# Patient Record
Sex: Female | Born: 1955
Health system: Southern US, Community
[De-identification: ages and names within clinical notes are randomized; demographics above are authoritative.]

## PROBLEM LIST (undated history)

## (undated) ENCOUNTER — Emergency Department (HOSPITAL_BASED_OUTPATIENT_CLINIC_OR_DEPARTMENT_OTHER)

## (undated) DIAGNOSIS — G2581 Restless legs syndrome: Secondary | ICD-10-CM

## (undated) DIAGNOSIS — N390 Urinary tract infection, site not specified: Secondary | ICD-10-CM

## (undated) DIAGNOSIS — F419 Anxiety disorder, unspecified: Secondary | ICD-10-CM

## (undated) DIAGNOSIS — Z8619 Personal history of other infectious and parasitic diseases: Secondary | ICD-10-CM

## (undated) DIAGNOSIS — G4700 Insomnia, unspecified: Secondary | ICD-10-CM

## (undated) HISTORY — DX: Anxiety disorder, unspecified: F41.9

## (undated) HISTORY — DX: Personal history of other infectious and parasitic diseases: Z86.19

## (undated) HISTORY — DX: Restless legs syndrome: G25.81

## (undated) HISTORY — PX: WISDOM TOOTH EXTRACTION: SHX21

## (undated) HISTORY — PX: OTHER SURGICAL HISTORY: SHX169

## (undated) HISTORY — DX: Insomnia, unspecified: G47.00

---

## 1998-02-18 ENCOUNTER — Other Ambulatory Visit: Admission: RE | Admit: 1998-02-18 | Discharge: 1998-02-18 | Payer: Self-pay | Admitting: Obstetrics and Gynecology

## 1999-04-23 ENCOUNTER — Other Ambulatory Visit: Admission: RE | Admit: 1999-04-23 | Discharge: 1999-04-23 | Payer: Self-pay | Admitting: Obstetrics and Gynecology

## 2000-06-20 ENCOUNTER — Other Ambulatory Visit: Admission: RE | Admit: 2000-06-20 | Discharge: 2000-06-20 | Payer: Self-pay | Admitting: Obstetrics and Gynecology

## 2001-11-13 ENCOUNTER — Other Ambulatory Visit: Admission: RE | Admit: 2001-11-13 | Discharge: 2001-11-13 | Payer: Self-pay | Admitting: Obstetrics and Gynecology

## 2002-12-09 ENCOUNTER — Other Ambulatory Visit: Admission: RE | Admit: 2002-12-09 | Discharge: 2002-12-09 | Payer: Self-pay | Admitting: Obstetrics and Gynecology

## 2004-01-12 ENCOUNTER — Other Ambulatory Visit: Admission: RE | Admit: 2004-01-12 | Discharge: 2004-01-12 | Payer: Self-pay | Admitting: Obstetrics and Gynecology

## 2005-02-09 ENCOUNTER — Other Ambulatory Visit: Admission: RE | Admit: 2005-02-09 | Discharge: 2005-02-09 | Payer: Self-pay | Admitting: Obstetrics and Gynecology

## 2011-01-05 ENCOUNTER — Other Ambulatory Visit: Payer: Self-pay | Admitting: Family Medicine

## 2011-01-05 ENCOUNTER — Ambulatory Visit
Admission: RE | Admit: 2011-01-05 | Discharge: 2011-01-05 | Disposition: A | Discharge status: Home / Self Care | Payer: BC Managed Care – PPO | Source: Ambulatory Visit | Attending: Family Medicine | Admitting: Family Medicine

## 2011-01-05 DIAGNOSIS — M79671 Pain in right foot: Secondary | ICD-10-CM

## 2011-11-19 ENCOUNTER — Encounter (HOSPITAL_COMMUNITY): Payer: Self-pay | Admitting: *Deleted

## 2011-11-19 ENCOUNTER — Emergency Department (HOSPITAL_COMMUNITY): Payer: BC Managed Care – PPO

## 2011-11-19 ENCOUNTER — Emergency Department (HOSPITAL_COMMUNITY)
Admission: EM | Admit: 2011-11-19 | Discharge: 2011-11-19 | Disposition: A | Discharge status: Home / Self Care | Payer: BC Managed Care – PPO | Attending: Emergency Medicine | Admitting: Emergency Medicine

## 2011-11-19 DIAGNOSIS — S62660B Nondisplaced fracture of distal phalanx of right index finger, initial encounter for open fracture: Secondary | ICD-10-CM

## 2011-11-19 DIAGNOSIS — S62639B Displaced fracture of distal phalanx of unspecified finger, initial encounter for open fracture: Secondary | ICD-10-CM | POA: Insufficient documentation

## 2011-11-19 DIAGNOSIS — M79609 Pain in unspecified limb: Secondary | ICD-10-CM | POA: Insufficient documentation

## 2011-11-19 DIAGNOSIS — W230XXA Caught, crushed, jammed, or pinched between moving objects, initial encounter: Secondary | ICD-10-CM | POA: Insufficient documentation

## 2011-11-19 DIAGNOSIS — S61209A Unspecified open wound of unspecified finger without damage to nail, initial encounter: Secondary | ICD-10-CM | POA: Insufficient documentation

## 2011-11-19 MED ORDER — HYDROCODONE-ACETAMINOPHEN 5-325 MG PO TABS
2.0000 | ORAL_TABLET | Freq: Once | ORAL | Status: AC
  Administered 2011-11-19: 2 via ORAL
  Filled 2011-11-19: qty 2

## 2011-11-19 MED ORDER — CEPHALEXIN 250 MG PO CAPS
500.0000 mg | ORAL_CAPSULE | Freq: Once | ORAL | Status: AC
  Administered 2011-11-19: 500 mg via ORAL
  Filled 2011-11-19: qty 2

## 2011-11-19 MED ORDER — HYDROCODONE-ACETAMINOPHEN 5-325 MG PO TABS: 2.0000 | ORAL_TABLET | Freq: Four times a day (QID) | ORAL | Status: AC | PRN

## 2011-11-19 MED ORDER — LIDOCAINE HCL (PF) 1 % IJ SOLN
5.0000 mL | Freq: Once | INTRAMUSCULAR | Status: AC
  Administered 2011-11-19: 5 mL
  Filled 2011-11-19: qty 5

## 2011-11-19 MED ORDER — CEPHALEXIN 500 MG PO CAPS: 500.0000 mg | ORAL_CAPSULE | Freq: Four times a day (QID) | ORAL | Status: AC

## 2011-11-19 NOTE — ED Provider Notes (Signed)
History     CSN: [id number]  Arrival date & time 11/19/11  1513   First MD Initiated Contact with Patient 11/19/11 1619      Chief Complaint  Patient presents with  . Finger Injury    (Consider location/radiation/quality/duration/timing/severity/associated sxs/prior treatment) HPI Patient is a 56 year old female who is referred here today after and during her right index finger. Patient got her finger caught in her dog's chain collar. She sustained a laceration to the ulnar side of the right first digit along the distal phalanx. This abuts the nailbed but does not disrupt it. Patient did have a plain film that shows that she has a distal tuft fracture. She says that her last tetanus shot has been within the past 10 years. Patient seeming to be stable and has no other health problems. Her pain as a 6/10. It is worse with palpation movement. It is a throbbing sensation.There are no other associated or modifying factors.  History reviewed. No pertinent past medical history.  History reviewed. No pertinent past surgical history.  History reviewed. No pertinent family history.  History  Substance Use Topics  . Smoking status: Not on file  . Smokeless tobacco: Not on file  . Alcohol Use: No    OB History    Grav Para Term Preterm Abortions TAB SAB Ect Mult Living                  Review of Systems  Constitutional: Negative.   HENT: Negative.   Eyes: Negative.   Respiratory: Negative.   Cardiovascular: Negative.   Gastrointestinal: Negative.   Genitourinary: Negative.   Musculoskeletal:       See HPI  Skin: Positive for wound.  Neurological: Negative.   Hematological: Negative.   Psychiatric/Behavioral: Negative.   All other systems reviewed and are negative.    Allergies  Review of patient's allergies indicates no known allergies.  Home Medications   Current Outpatient Rx  Name Route Sig Dispense Refill  . ZOLPIDEM TARTRATE 10 MG PO TABS Oral Take 10 mg by mouth  at bedtime as needed. For sleep    . CEPHALEXIN 500 MG PO CAPS Oral Take 1 capsule (500 mg total) by mouth 4 (four) times daily. 40 capsule 0  . HYDROCODONE-ACETAMINOPHEN 5-325 MG PO TABS Oral Take 2 tablets by mouth every 6 (six) hours as needed for pain. 30 tablet 0    BP 116/63  Pulse 66  Temp(Src) 98.4 F (36.9 C) (Oral)  Resp 20  SpO2 100%  Physical Exam  Nursing note and vitals reviewed. Constitutional: She is oriented to person, place, and time. She appears well-developed and well-nourished. No distress.  HENT:  Head: Normocephalic and atraumatic.  Eyes: Conjunctivae and EOM are normal. Pupils are equal, round, and reactive to light.  Neck: Normal range of motion.  Musculoskeletal:       Right index finger with linear laceration along the ulnar surface of the fingernail it extends toward the palmar surface. It is approximately 3 cm in length. There is some bruising noted beneath the nail bed. The nail itself is intact there  Neurological: She is alert and oriented to person, place, and time.  Skin: Skin is warm and dry. No rash noted.       See HPI  Psychiatric: She has a normal mood and affect.    ED Course  Procedures (including critical care time)  Labs Reviewed - No data to display Dg Finger Index Right  11/19/2011  *RADIOLOGY REPORT*  Clinical Data: Injured right index finger  RIGHT INDEX FINGER 2+V  Comparison: None.  Findings: Distal tuft fracture.  No additional fractures are seen.  The visualized soft tissues are unremarkable.  Mild soft tissue swelling/irregularity.  IMPRESSION: Distal tuft fracture.  Original Report Authenticated By: Charline Bills, M.D.     1. Open nondisplaced fracture of distal phalanx of right index finger       MDM   Patient was evaluated by myself. She did have fracture with associated laceration. In prophylaxis for open fracture she was given Keflex 500 mg by mouth. Laceration repair was performed by my physician's assistant.  Please see her note for details of procedure. Patient did not require tetanus shot. Following completion of procedure patient was offered splint but declined to wait for this. She was given pain medication. Patient was discharged with prescription for Keflex as well as Vicodin. She was given the name of the hand specialist on call. She is to see any physician in 5-7 days for suture removal and specifically was given hand specialist given that this is her dominant hand and could be an open fracture. She was discharged in good condition with instructions to use ice and elevate her hand as often as possible.  Cyndra Numbers, MD 11/19/11 2036

## 2011-11-19 NOTE — ED Notes (Signed)
Sent here from [facility] for further eval of injury to right index finger.

## 2011-11-19 NOTE — ED Provider Notes (Signed)
Laceration repair by myself by request of Dr. Halford Chessman. Good cap refill of right pointer finger and sensation pre and post laceration repair.   LACERATION REPAIR Performed by: Jenness Corner Authorized by: Jenness Corner Consent: Verbal consent obtained. Risks and benefits: risks, benefits and alternatives were discussed Consent given by: patient Patient identity confirmed: provided demographic data Prepped and Draped in normal sterile fashion Wound explored  Laceration Location: right distal tip of pointer finger  Laceration Length: 1cm  No Foreign Bodies seen or palpated  Anesthesia: digital block  Local anesthetic: lidocaine 2%   Anesthetic total: 5 ml  Irrigation method: syringe Amount of cleaning: standard  Skin closure: 4.0 nylon  Number of sutures: 5  Technique: simple interrupted.  Patient tolerance: Patient tolerated the procedure well with no immediate complications.  Jenness Corner, [place] 11/19/11 [phone]

## 2011-11-19 NOTE — ED Provider Notes (Signed)
Medical screening examination/treatment/procedure(s) were conducted as a shared visit with non-physician practitioner(s) and myself.  I personally evaluated the patient during the encounter  Cyndra Numbers, MD 11/19/11 2036

## 2013-08-08 ENCOUNTER — Ambulatory Visit (INDEPENDENT_AMBULATORY_CARE_PROVIDER_SITE_OTHER): Payer: BC Managed Care – PPO | Admitting: Podiatry

## 2013-08-08 ENCOUNTER — Ambulatory Visit (INDEPENDENT_AMBULATORY_CARE_PROVIDER_SITE_OTHER): Payer: BC Managed Care – PPO

## 2013-08-08 ENCOUNTER — Encounter: Payer: Self-pay | Admitting: Podiatry

## 2013-08-08 VITALS — BP 130/73 | HR 63 | Resp 16 | Ht 66.0 in | Wt 125.0 lb

## 2013-08-08 DIAGNOSIS — S92302B Fracture of unspecified metatarsal bone(s), left foot, initial encounter for open fracture: Secondary | ICD-10-CM

## 2013-08-08 DIAGNOSIS — S92309B Fracture of unspecified metatarsal bone(s), unspecified foot, initial encounter for open fracture: Secondary | ICD-10-CM

## 2013-08-08 DIAGNOSIS — M8448XA Pathological fracture, other site, initial encounter for fracture: Secondary | ICD-10-CM

## 2013-08-09 NOTE — Progress Notes (Signed)
Subjective:     Patient ID: Misty Bullock, female   DOB: 1955/11/05, 57 y.o.   MRN: [id number]  Foot Pain   patient states I am developing a lot of swelling on top of my left foot and I was walking different on the outside of his foot and bats when the pain started. States it's been present for 2 weeks   Review of Systems  All other systems reviewed and are negative.       Objective:   Physical Exam  Nursing note and vitals reviewed. Constitutional: She is oriented to person, place, and time. She appears well-developed.  Cardiovascular: Intact distal pulses.   Musculoskeletal: Normal range of motion.  Neurological: She is oriented to person, place, and time.  Skin: Skin is warm.   patient has swelling on the lateral side of the left foot within the forefoot and mostly surrounding the fourth metatarsal distal shaft. Also noted there to be discomfort when I pressed into this tissue     Assessment:     Probable stress fracture lateral side left foot secondary to gait change and history of fracture in the past    Plan:     H&P and x-rays reviewed with patient. Engineer, mining and surgical shoe and discussed wearing this for 3 or 4 days followed by wearing a surgical shoe for approximately 2 weeks. If this does not improve I want to see her back again for further x-ray and I did review stress fracture

## 2014-08-12 ENCOUNTER — Encounter: Payer: Self-pay | Admitting: Podiatry

## 2014-08-12 ENCOUNTER — Ambulatory Visit (INDEPENDENT_AMBULATORY_CARE_PROVIDER_SITE_OTHER): Payer: BC Managed Care – PPO | Admitting: Podiatry

## 2014-08-12 ENCOUNTER — Ambulatory Visit (INDEPENDENT_AMBULATORY_CARE_PROVIDER_SITE_OTHER): Payer: BC Managed Care – PPO

## 2014-08-12 VITALS — BP 143/82 | HR 70 | Resp 16

## 2014-08-12 DIAGNOSIS — R609 Edema, unspecified: Secondary | ICD-10-CM

## 2014-08-12 DIAGNOSIS — M7661 Achilles tendinitis, right leg: Secondary | ICD-10-CM

## 2014-08-12 DIAGNOSIS — M722 Plantar fascial fibromatosis: Secondary | ICD-10-CM

## 2014-08-12 DIAGNOSIS — M779 Enthesopathy, unspecified: Secondary | ICD-10-CM

## 2014-08-12 MED ORDER — TRIAMCINOLONE ACETONIDE 10 MG/ML IJ SUSP
10.0000 mg | Freq: Once | INTRAMUSCULAR | Status: AC
  Administered 2014-08-12: 10 mg

## 2014-08-12 NOTE — Patient Instructions (Signed)

## 2014-08-12 NOTE — Progress Notes (Signed)
Subjective:     Patient ID: Tarini Carrier Lader, female   DOB: 12/13/1955, 58 y.o.   MRN: [id number]  HPIpatient presents stating I'm getting a lot of pain behind my right heel and it's really been sore for the last several weeks. Do not remember a specific injury and I'm having I surgery on Thursday   Review of Systems  All other systems reviewed and are negative.      Objective:   Physical Exam  Constitutional: She is oriented to person, place, and time.  Cardiovascular: Intact distal pulses.   Musculoskeletal: Normal range of motion.  Neurological: She is oriented to person, place, and time.  Skin: Skin is warm.  Nursing note and vitals reviewed.  neurovascular status found to be intact with muscle strength adequate in range of motion within normal limits. I checked muscle strength of the peroneal tendon right and found to be in place with no indications of dysfunction or tearing. There is a lot of pain in the lateral side of the right ankle with inflammation noted along the peroneal tendon     Assessment:     Probable peroneal tendinitis right with no indications of active tear but possible due to swelling    Plan:     H&P and x-rays reviewed. Today I did a careful sheath injection right peroneal tendon 3 mg Kenalog 5 mg Xylocaine and advised on ice and compression therapy. If symptoms persist patient will reappoint and we may have to apply a cast or brace

## 2015-03-23 ENCOUNTER — Ambulatory Visit: Payer: Self-pay

## 2015-03-23 ENCOUNTER — Encounter: Payer: Self-pay | Admitting: Podiatry

## 2015-03-23 ENCOUNTER — Ambulatory Visit (INDEPENDENT_AMBULATORY_CARE_PROVIDER_SITE_OTHER): Payer: BC Managed Care – PPO | Admitting: Podiatry

## 2015-03-23 VITALS — BP 141/85 | HR 76 | Resp 12

## 2015-03-23 DIAGNOSIS — M779 Enthesopathy, unspecified: Secondary | ICD-10-CM

## 2015-03-23 DIAGNOSIS — R52 Pain, unspecified: Secondary | ICD-10-CM

## 2015-03-23 MED ORDER — TRIAMCINOLONE ACETONIDE 10 MG/ML IJ SUSP
10.0000 mg | Freq: Once | INTRAMUSCULAR | Status: AC
  Administered 2015-03-23: 10 mg

## 2015-03-24 NOTE — Progress Notes (Signed)
Subjective:     Patient ID: Misty Bullock, female   DOB: 1956-09-28, 59 y.o.   MRN: [id number]  HPI patient states she's developed acute reoccurrence of pain plantar aspect right heel and that she's walking differently and is developed a lot of pain in her right ankle that makes ambulation difficult   Review of Systems     Objective:   Physical Exam Neurovascular status intact with exquisite discomfort plantar aspect right heel and pain in the right sinus tarsi with inflammation fluid buildup noted    Assessment:     Plantar fasciitis right with probable sinus tarsitis right which is compensatory in nature    Plan:     Injected the right plantar fascia 3 mg Kenalog 5 mg Xylocaine and also the capsule of the right sinus tarsi 3 mg Kenalog 5 mg Xylocaine and applied fascial brace to immobilize. Reappoint to recheck in the next several weeks

## 2017-02-22 ENCOUNTER — Emergency Department (HOSPITAL_COMMUNITY)
Admission: EM | Admit: 2017-02-22 | Discharge: 2017-02-22 | Disposition: A | Discharge status: Home / Self Care | Payer: BC Managed Care – PPO | Attending: Emergency Medicine | Admitting: Emergency Medicine

## 2017-02-22 ENCOUNTER — Emergency Department (HOSPITAL_COMMUNITY): Payer: BC Managed Care – PPO

## 2017-02-22 ENCOUNTER — Encounter (HOSPITAL_COMMUNITY): Payer: Self-pay

## 2017-02-22 DIAGNOSIS — Z79899 Other long term (current) drug therapy: Secondary | ICD-10-CM | POA: Insufficient documentation

## 2017-02-22 DIAGNOSIS — S82121A Displaced fracture of lateral condyle of right tibia, initial encounter for closed fracture: Secondary | ICD-10-CM | POA: Diagnosis not present

## 2017-02-22 DIAGNOSIS — S82831A Other fracture of upper and lower end of right fibula, initial encounter for closed fracture: Secondary | ICD-10-CM | POA: Diagnosis not present

## 2017-02-22 DIAGNOSIS — S82141A Displaced bicondylar fracture of right tibia, initial encounter for closed fracture: Secondary | ICD-10-CM

## 2017-02-22 DIAGNOSIS — Y92009 Unspecified place in unspecified non-institutional (private) residence as the place of occurrence of the external cause: Secondary | ICD-10-CM | POA: Diagnosis not present

## 2017-02-22 DIAGNOSIS — W19XXXA Unspecified fall, initial encounter: Secondary | ICD-10-CM

## 2017-02-22 DIAGNOSIS — Z91040 Latex allergy status: Secondary | ICD-10-CM | POA: Diagnosis not present

## 2017-02-22 DIAGNOSIS — W06XXXA Fall from bed, initial encounter: Secondary | ICD-10-CM | POA: Insufficient documentation

## 2017-02-22 DIAGNOSIS — S8991XA Unspecified injury of right lower leg, initial encounter: Secondary | ICD-10-CM | POA: Diagnosis present

## 2017-02-22 DIAGNOSIS — Y939 Activity, unspecified: Secondary | ICD-10-CM | POA: Diagnosis not present

## 2017-02-22 DIAGNOSIS — Y999 Unspecified external cause status: Secondary | ICD-10-CM | POA: Diagnosis not present

## 2017-02-22 LAB — BASIC METABOLIC PANEL
Anion gap: 13 (ref 5–15)
BUN: 8 mg/dL (ref 6–20)
CHLORIDE: 92 mmol/L — AB (ref 101–111)
CO2: 25 mmol/L (ref 22–32)
CREATININE: 0.63 mg/dL (ref 0.44–1.00)
Calcium: 9.5 mg/dL (ref 8.9–10.3)
GFR calc Af Amer: >60 mL/min (ref >60)
GFR calc non Af Amer: >60 mL/min (ref >60)
GLUCOSE: 118 mg/dL — AB (ref 65–99)
Potassium: 4.4 mmol/L (ref 3.5–5.1)
Sodium: 130 mmol/L — ABNORMAL LOW (ref 135–145)

## 2017-02-22 LAB — CBC
HCT: 37.9 % (ref 36.0–46.0)
HEMOGLOBIN: 12.9 g/dL (ref 12.0–15.0)
MCH: 31.5 pg (ref 26.0–34.0)
MCHC: 34 g/dL (ref 30.0–36.0)
MCV: 92.7 fL (ref 78.0–100.0)
Platelets: 244 10*3/uL (ref 150–400)
RBC: 4.09 MIL/uL (ref 3.87–5.11)
RDW: 12.3 % (ref 11.5–15.5)
WBC: 8.4 10*3/uL (ref 4.0–10.5)

## 2017-02-22 MED ORDER — MORPHINE SULFATE (PF) 4 MG/ML IV SOLN
4.0000 mg | Freq: Once | INTRAVENOUS | Status: AC
  Administered 2017-02-22: 4 mg via INTRAVENOUS
  Filled 2017-02-22: qty 1

## 2017-02-22 MED ORDER — HYDROMORPHONE HCL 1 MG/ML IJ SOLN
1.0000 mg | Freq: Once | INTRAMUSCULAR | Status: AC
  Administered 2017-02-22: 1 mg via INTRAVENOUS
  Filled 2017-02-22: qty 1

## 2017-02-22 MED ORDER — ONDANSETRON HCL 4 MG/2ML IJ SOLN
4.0000 mg | Freq: Once | INTRAMUSCULAR | Status: AC
  Administered 2017-02-22: 4 mg via INTRAVENOUS
  Filled 2017-02-22: qty 2

## 2017-02-22 MED ORDER — KETOROLAC TROMETHAMINE 30 MG/ML IJ SOLN
30.0000 mg | Freq: Once | INTRAMUSCULAR | Status: AC
  Administered 2017-02-22: 30 mg via INTRAVENOUS
  Filled 2017-02-22: qty 1

## 2017-02-22 MED ORDER — MELOXICAM 7.5 MG PO TABS: 15.0000 mg | ORAL_TABLET | Freq: Every day | ORAL | 0 refills | Status: DC

## 2017-02-22 MED ORDER — OXYCODONE-ACETAMINOPHEN 5-325 MG PO TABS: 1.0000 | ORAL_TABLET | Freq: Four times a day (QID) | ORAL | 0 refills | Status: DC | PRN

## 2017-02-22 MED ORDER — OXYCODONE-ACETAMINOPHEN 5-325 MG PO TABS
2.0000 | ORAL_TABLET | Freq: Once | ORAL | Status: AC
  Administered 2017-02-22: 2 via ORAL
  Filled 2017-02-22: qty 2

## 2017-02-22 NOTE — Discharge Instructions (Signed)
Take Mobic as prescribed. Discontinue use of ibuprofen or Aleve as Mobic is similar to these medications. You may take Percocet for severe pain. Wear a knee immobilizer at all times. Do not put weight on your right leg. Use crutches to assist when walking. We also advised the ice your knee 3-4 times per day for 15-20 minutes each time. Keep your right leg elevated as much as possible.  If you experience loss of sensation in your right leg or if your right leg becomes cold compared to your left, especially in instances of worsening pain, we advise that you return to the emergency department for further evaluation. Otherwise, call Dr. Ninfa Linden this morning to schedule close follow-up to ensure proper healing of your broken bone. You may require surgery. This will be discussed further with your orthopedist.

## 2017-02-22 NOTE — ED Notes (Addendum)
Pt is alert and oriented x 4 and is verbally responsive. Pt reports tripping over her dog while getting out of bed and when she tried to get up she was unable to walk. Pt has swelling noted to her right ankle and rt knee as well as noted deformities to her tib/fib regions with some mild bruising forming to right shin. Ice was placed on pt ankle.

## 2017-02-22 NOTE — ED Notes (Signed)
Patient waiting on ride. Advance delivered wheelchair and shower chair.

## 2017-02-22 NOTE — ED Provider Notes (Signed)
[facility] DEPT Provider Note   CSN: [id number] Arrival date & time: 02/22/17  0234     History   Chief Complaint Chief Complaint  Patient presents with  . Fall    HPI Misty Bullock is a 61 y.o. female.  61 year old female with no significant past medical history presents to the emergency department for right lower extremity pain with onset this evening. Patient states that she tripped over her dog while getting out of bed. She has been unable to bear weight on her right leg since the fall. She complains of pain to her knee distally. Pain is constant and worse with weightbearing or movement. She took 3 over-the-counter ibuprofen prior to arrival without significant relief of pain. No numbness or head injury. No loss of consciousness.   The history is provided by the patient. No language interpreter was used.  Fall     History reviewed. No pertinent past medical history.  There are no active problems to display for this patient.   History reviewed. No pertinent surgical history.  OB History    No data available       Home Medications    Prior to Admission medications   Medication Sig Start Date End Date Taking? Authorizing Provider  ALPRAZolam Duanne Moron) 0.5 MG tablet Take 0.5 mg by mouth daily. 01/30/17  Yes [provider]  FLUoxetine (PROZAC) 20 MG capsule Take 20 mg by mouth daily.   Yes [provider]  zolpidem (AMBIEN) 10 MG tablet Take 10 mg by mouth at bedtime as needed. For sleep   Yes [provider]  meloxicam (MOBIC) 7.5 MG tablet Take 2 tablets (15 mg total) by mouth daily. 02/22/17   Antonietta Breach, PA-C  oxyCODONE-acetaminophen (PERCOCET/ROXICET) 5-325 MG tablet Take 1-2 tablets by mouth every 6 (six) hours as needed for severe pain. 02/22/17   Antonietta Breach, PA-C    Family History History reviewed. No pertinent family history.  Social History Social History  Substance Use Topics  . Smoking status: Never Smoker  . Smokeless  tobacco: Never Used  . Alcohol use 0.0 oz/week     Comment: wine     Allergies   Latex   Review of Systems Review of Systems Ten systems reviewed and are negative for acute change, except as noted in the HPI.    Physical Exam Updated Vital Signs BP 132/81 (BP Location: Left Arm)   Pulse 72   Temp 98.1 F (36.7 C) (Oral)   Resp 14   SpO2 97%   Physical Exam  Constitutional: She is oriented to person, place, and time. She appears well-developed and well-nourished. No distress.  Nontoxic and in NAD  HENT:  Head: Normocephalic and atraumatic.  Eyes: Conjunctivae and EOM are normal. No scleral icterus.  Neck: Normal range of motion.  Cardiovascular: Normal rate, regular rhythm and intact distal pulses.   Pulmonary/Chest: Effort normal. No respiratory distress.  Respirations even and unlabored.  Musculoskeletal:       Right knee: She exhibits decreased range of motion and swelling. She exhibits no erythema.       Right ankle: She exhibits decreased range of motion and swelling.       Right lower leg: She exhibits deformity.       Legs: Swelling noted to the right knee as well as distal and lateral to the knee. There is tenting of the skin to the anterior lower leg. No significant tenderness to palpation in this area. Diffuse swelling noted to the right  ankle. No deformity or crepitus to the ankle on exam. Patient able to wiggle all toes. No overlying laceration or bony exposure. Compartments of the left lower extremity are soft.  Neurological: She is alert and oriented to person, place, and time. She exhibits normal muscle tone. Coordination normal.  Sensation to light touch intact in the right lower extremity.  Skin: Skin is warm and dry. No rash noted. She is not diaphoretic. No erythema. No pallor.  Psychiatric: She has a normal mood and affect. Her behavior is normal.  Nursing note and vitals reviewed.    ED Treatments / Results  Labs (all labs ordered are listed, but  only abnormal results are displayed) Labs Reviewed  BASIC METABOLIC PANEL - Abnormal; Notable for the following:       Result Value   Sodium 130 (*)    Chloride 92 (*)    Glucose, Bld 118 (*)    All other components within normal limits  CBC    EKG  EKG Interpretation None       Radiology Dg Tibia/fibula Right  Result Date: 02/22/2017 CLINICAL DATA:  Tripped over dog, fall onto hardwood floor. Right knee, lower leg and ankle pain. Swelling. EXAM: RIGHT TIBIA AND FIBULA - 2 VIEW COMPARISON:  No prior.  Concurrent knee and ankle radiographs. FINDINGS: Knee joint and ankle fractures better characterized on dedicated joint exams. Tibial and fibular shafts are intact. Soft tissue edema about the lateral malleolus. Soft tissue edema and joint effusion about the knee. IMPRESSION: Knee joint and lateral malleolar fractures better characterized on concurrent dedicated joint exams. Diffuse bony under mineralization. Electronically Signed   By: Jeb Levering M.D.   On: 02/22/2017 03:59   Dg Ankle Complete Right  Result Date: 02/22/2017 CLINICAL DATA:  Trip over dog onto hardwood floor, right ankle pain. EXAM: RIGHT ANKLE - COMPLETE 3+ VIEW COMPARISON:  None. FINDINGS: Nondisplaced oblique fracture of the distal fibula. This is at the level of the ankle mortise. Ankle mortise is preserved. No additional acute fracture. The bones are diffusely under mineralized. There is lateral soft tissue edema. No definite tibial talar joint effusion. IMPRESSION: Nondisplaced oblique fracture of the distal fibula at the level of the ankle mortise. Electronically Signed   By: Jeb Levering M.D.   On: 02/22/2017 04:01   Ct Tibia Fibula Right Wo Contrast  Result Date: 02/22/2017 CLINICAL DATA:  Tibial plateau on lateral malleolar fractures. EXAM: CT OF THE LOWER RIGHT EXTREMITY WITHOUT CONTRAST TECHNIQUE: Multidetector CT imaging of the right lower extremity was performed according to the standard protocol.  COMPARISON:  None. FINDINGS: Bones/Joint/Cartilage Comminuted fracture lateral tibial plateau with central depression of 5 mm. Fracture extends to the lateral aspect of the tibial spine. No medial tibial plateau component. There may be minimal extension to the metaphysis, bony under mineralization limits assessment. No definite vertically-oriented component. The lipohemarthrosis on radiographs is not included in the field of view. Fracture of the lateral malleolus/fibular tip is minimally displaced distally, minimally comminuted. Small fracture fragments extends into the lateral ankle mortise. No fracture of the medial malleolus or posterior tibial tubercle. Diffuse osteopenia/osteoporosis. Ligaments Suboptimally assessed by CT. Muscles and Tendons No large intramuscular hematoma.  Achilles tendon is intact. Soft tissues Soft tissue edema adjacent to the lateral malleolus. Soft tissue edema about the lateral knee. IMPRESSION: 1. Lateral tibial plateau fracture with lateral depression. Possible glass of fracture, this is a Schatzker type II or IIIa. 2. Mildly comminuted fracture of the distal fibular tip and  lateral malleolus. Minimal displacement, with small fracture fragment extending into the lateral ankle mortise. Electronically Signed   By: Jeb Levering M.D.   On: 02/22/2017 05:34   Dg Knee Complete 4 Views Right  Result Date: 02/22/2017 CLINICAL DATA:  Tripped over dog onto hardwood floor. Left knee pain. EXAM: RIGHT KNEE - COMPLETE 4+ VIEW COMPARISON:  None. FINDINGS: Mildly comminuted lateral tibial plateau fracture. Mild central depression. No definite involvement of the medial tibial plateau. Proximal fibula is intact. Moderate joint effusion. The bones are diffusely under mineralized. IMPRESSION: Comminuted lateral tibial plateau fracture with central depression. Electronically Signed   By: Jeb Levering M.D.   On: 02/22/2017 04:00    Procedures Procedures (including critical care  time)  Medications Ordered in ED Medications  ketorolac (TORADOL) 30 MG/ML injection 30 mg (not administered)  oxyCODONE-acetaminophen (PERCOCET/ROXICET) 5-325 MG per tablet 2 tablet (not administered)  morphine 4 MG/ML injection 4 mg (4 mg Intravenous Given 02/22/17 0406)  ondansetron (ZOFRAN) injection 4 mg (4 mg Intravenous Given 02/22/17 0406)  HYDROmorphone (DILAUDID) injection 1 mg (1 mg Intravenous Given 02/22/17 0527)    6:05 AM Case discussed with Dr. Ninfa Linden, on-call for orthopedics. He recommends use of a knee immobilizer and that patient remain nonweightbearing. He will see the patient in the office to discuss further management; states that surgical intervention would best be indicated, if necessary, once swelling improves.   6:23 AM Compartments of the right lower extremity are soft on repeat assessment. Pain better controlled following Dilaudid. Will transition to oral medications.   Initial Impression / Assessment and Plan / ED Course  I have reviewed the triage vital signs and the nursing notes.  Pertinent labs & imaging results that were available during my care of the patient were reviewed by me and considered in my medical decision making (see chart for details).     61 year old female presents to the emergency department for evaluation of pain to her right lower extremity with inability to bear weight. Symptoms began after a fall out of bed. Patient reports tripping over her dog. Patient neurovascularly intact on assessment. She was found to have a tibial plateau fracture on imaging as well as a distal fibular fracture. This was further characterized with CT scan.  Case discussed with orthopedics who recommended use of a knee immobilizer. Patient to be nonweightbearing with close outpatient follow-up in the office. Pain well controlled with IV pain medications. All findings and outpatient plan discussed with patient who verbalizes understanding. Return precautions  discussed and provided. Patient discharged in stable condition with no unaddressed concerns.   Vitals:   02/22/17 0251 02/22/17 0327 02/22/17 0416 02/22/17 0549  BP: 117/69  116/75 132/81  Pulse: 76  68 72  Resp: 16  13 14   Temp: 98.1 F (36.7 C)     TempSrc: Oral     SpO2: 100% 100% 97% 97%    Final Clinical Impressions(s) / ED Diagnoses   Final diagnoses:  Closed fracture of right tibial plateau, initial encounter  Closed fracture of distal end of right fibula, unspecified fracture morphology, initial encounter  Fall in home, initial encounter    New Prescriptions New Prescriptions   MELOXICAM (MOBIC) 7.5 MG TABLET    Take 2 tablets (15 mg total) by mouth daily.   OXYCODONE-ACETAMINOPHEN (PERCOCET/ROXICET) 5-325 MG TABLET    Take 1-2 tablets by mouth every 6 (six) hours as needed for severe pain.     Antonietta Breach, PA-C 02/22/17 [place],  Daleen Bo, MD 02/22/17 0063

## 2017-02-22 NOTE — ED Triage Notes (Signed)
Pt brought in by EMS from home pt had a fall at home, was found in home in bed. Pt was getting out of bed and tripped over her dog. Pt is now c/o right knee pain and right ankle pain.

## 2017-02-22 NOTE — Care Management (Signed)
    Durable Medical Equipment        Start     Ordered   02/22/17 1029  For home use only DME standard manual wheelchair with seat cushion  Once    Comments:  Patient suffers from can walk which impairs their ability to perform daily activities like toileting in the home.  A cane will not resolve  issue with performing activities of daily living. A wheelchair will allow patient to safely perform daily activities. Patient can safely propel the wheelchair in the home or has a caregiver who can provide assistance.  Accessories: elevating leg rests (ELRs), wheel locks, extensions and anti-tippers.   02/22/17 1028   02/22/17 0000  For home use only DME 3 n 1     02/22/17 1038

## 2017-02-22 NOTE — ED Notes (Signed)
Case manager in with the patient.

## 2017-02-22 NOTE — Care Management (Signed)
ED CM met with patient at bedside to discuss care transitional care needs. Patient reports living at home alone. Patient fell at home unable to [facility] on the right leg.  Recommendation is to follow up with Ortho for management of this injury.   Patient may benefit from w/c at home, she is agreeable. Offered choice for DME company. [employer] selected, referral called into [facility] DME liaison Joelene Millin and will process the referral and deliver equipment to patient prior to discharge home. Patient verbalized understanding teach back done.  No further ED CM needs identified

## 2017-02-23 ENCOUNTER — Encounter (INDEPENDENT_AMBULATORY_CARE_PROVIDER_SITE_OTHER): Payer: Self-pay | Admitting: Orthopaedic Surgery

## 2017-02-23 ENCOUNTER — Ambulatory Visit (INDEPENDENT_AMBULATORY_CARE_PROVIDER_SITE_OTHER): Payer: BC Managed Care – PPO | Admitting: Orthopaedic Surgery

## 2017-02-23 VITALS — Ht 66.0 in | Wt 122.0 lb

## 2017-02-23 DIAGNOSIS — S82141A Displaced bicondylar fracture of right tibia, initial encounter for closed fracture: Secondary | ICD-10-CM

## 2017-02-23 DIAGNOSIS — S8264XA Nondisplaced fracture of lateral malleolus of right fibula, initial encounter for closed fracture: Secondary | ICD-10-CM | POA: Diagnosis not present

## 2017-02-23 NOTE — Progress Notes (Signed)
Office Visit Note   Patient: Misty Bullock           Date of Birth: Sep 14, 1956           MRN: [id number] Visit Date: 02/23/2017              Requested by: Arvella Nigh, [address] [place], [place] [address] PCP: Arvella Nigh, MD   Assessment & Plan: Visit Diagnoses:  1. Closed fracture of right tibial plateau, initial encounter   2. Closed nondisplaced fracture of lateral malleolus of right fibula, initial encounter     Plan: I was able to aspirate 30-40 mL of hemarthrosis from her right knee that she tolerated well. She'll continue remain nonweightbearing on the right lower extremity. She'll stay in the knee immobilizer with coming out of it for just limited range of motion of her knee but again no weightbearing. All questions were encouraged and answered. When I see her back next week I would like a AP and lateral of the right knee and a single mortise views of the right ankle. She understands that she still may need surgery on that right knee lower trying to avoid this due to her osteopenia.  Follow-Up Instructions: Return in about 6 days (around 03/01/2017).   Orders:  No orders of the defined types were placed in this encounter.  No orders of the defined types were placed in this encounter.     Procedures: No procedures performed   Clinical Data: No additional findings.   Subjective: Chief Complaint  Patient presents with  . Right Knee - Fracture    Lateral tibial plateau fracture with lateral depression. S/p fall after tripping over dog. DOI 02/22/17  . Right Ankle - Fracture    Oblique fracture of distal fibula  The patient is a very pleasant 61 year old who sustained a mechanical fall Hx a tripped in her bedroom over dog this past Tuesday which was just 2 days ago. According to the records she sustained a right tibial plateau fracture and a right ankle lateral malleolus fracture. She is placed in a knee immobilizer for her right knee and air splint for  her right ankle. She's had some Percocet for pain but it's causing her too much nausea. She otherwise does report only right knee pain and right ankle pain. She is someone who does not work in terms her regular employment. She denies any numbness and tingling in her right lower extremity. She does report a history of osteopenia. She denies any other major medical problems. Her pain is about 8 out of 10.  HPI  Review of Systems She denies any headache, chest pain, shortness of breath, fever, chills, nausea, vomiting.  Objective: Vital Signs: Ht 5\' 6"  (1.676 m)   Wt 122 lb (55.3 kg)   BMI 19.69 kg/m   Physical Exam She is alert and oriented 3 and in no acute distress Ortho Exam Examination of her right knee shows a obvious moderate effusion. She has some bruising and pain along the lateral joint line. There is no medial joint line tenderness. Her patellas well located. I did not put her through extensive flexion-extension. There was no instability with varus and valgus stressing but I had to be minimal with this due to her pain. Her foot is well perfused on the right side. His neurovascular intact. Her calf is soft. Her ankle has swelling over the lateral malleolus and the lateral ankle joint. There is slight swelling medially but no  medial pain. She has pain over there lateral malleolus. Specialty Comments:  No specialty comments available.  Imaging: No results found. X-rays on canopy system and apparently reviewed by me including an AP and lateral and oblique of the right knee as well as an AP mortise and lateral of the right ankle. There is also a CT scan of the right knee. As far as the right ankle goes there is a nondisplaced lateral malleolus fracture with a well located ankle mortise. There is no medial fracture. Her right knee does have a lateral tibial plateau fracture with only a slight split but definitely a depression. The overall alignments well-maintained.  PMFS History: Patient  Active Problem List   Diagnosis Date Noted  . Closed fracture of right tibial plateau 02/23/2017  . Closed nondisplaced fracture of lateral malleolus of right fibula 02/23/2017   History reviewed. No pertinent past medical history.  History reviewed. No pertinent family history.  History reviewed. No pertinent surgical history. Social History   Occupational History  . Not on file.   Social History Main Topics  . Smoking status: Never Smoker  . Smokeless tobacco: Never Used  . Alcohol use 0.0 oz/week     Comment: wine  . Drug use: No  . Sexual activity: Not on file

## 2017-02-27 ENCOUNTER — Emergency Department (HOSPITAL_COMMUNITY): Payer: BC Managed Care – PPO

## 2017-02-27 ENCOUNTER — Encounter (HOSPITAL_COMMUNITY): Payer: Self-pay | Admitting: Emergency Medicine

## 2017-02-27 ENCOUNTER — Emergency Department (HOSPITAL_COMMUNITY)
Admission: EM | Admit: 2017-02-27 | Discharge: 2017-02-27 | Disposition: A | Discharge status: Home / Self Care | Payer: BC Managed Care – PPO | Attending: Emergency Medicine | Admitting: Emergency Medicine

## 2017-02-27 DIAGNOSIS — Y999 Unspecified external cause status: Secondary | ICD-10-CM | POA: Insufficient documentation

## 2017-02-27 DIAGNOSIS — S52612A Displaced fracture of left ulna styloid process, initial encounter for closed fracture: Secondary | ICD-10-CM | POA: Diagnosis not present

## 2017-02-27 DIAGNOSIS — Y939 Activity, unspecified: Secondary | ICD-10-CM | POA: Diagnosis not present

## 2017-02-27 DIAGNOSIS — S62102A Fracture of unspecified carpal bone, left wrist, initial encounter for closed fracture: Secondary | ICD-10-CM

## 2017-02-27 DIAGNOSIS — Y929 Unspecified place or not applicable: Secondary | ICD-10-CM | POA: Diagnosis not present

## 2017-02-27 DIAGNOSIS — W1830XA Fall on same level, unspecified, initial encounter: Secondary | ICD-10-CM | POA: Insufficient documentation

## 2017-02-27 DIAGNOSIS — Z91040 Latex allergy status: Secondary | ICD-10-CM | POA: Diagnosis not present

## 2017-02-27 DIAGNOSIS — S52502A Unspecified fracture of the lower end of left radius, initial encounter for closed fracture: Secondary | ICD-10-CM

## 2017-02-27 DIAGNOSIS — S52352A Displaced comminuted fracture of shaft of radius, left arm, initial encounter for closed fracture: Secondary | ICD-10-CM | POA: Insufficient documentation

## 2017-02-27 DIAGNOSIS — Z79899 Other long term (current) drug therapy: Secondary | ICD-10-CM | POA: Diagnosis not present

## 2017-02-27 DIAGNOSIS — S59912A Unspecified injury of left forearm, initial encounter: Secondary | ICD-10-CM | POA: Diagnosis present

## 2017-02-27 DIAGNOSIS — W19XXXA Unspecified fall, initial encounter: Secondary | ICD-10-CM

## 2017-02-27 LAB — CBC WITH DIFFERENTIAL/PLATELET
Basophils Absolute: 0 10*3/uL (ref 0.0–0.1)
Basophils Relative: 0 %
Eosinophils Absolute: 0.1 10*3/uL (ref 0.0–0.7)
Eosinophils Relative: 1 %
HCT: 34.2 % — ABNORMAL LOW (ref 36.0–46.0)
Hemoglobin: 11 g/dL — ABNORMAL LOW (ref 12.0–15.0)
Lymphocytes Relative: 15 %
Lymphs Abs: 0.9 10*3/uL (ref 0.7–4.0)
MCH: 30.5 pg (ref 26.0–34.0)
MCHC: 32.2 g/dL (ref 30.0–36.0)
MCV: 94.7 fL (ref 78.0–100.0)
Monocytes Absolute: 0.9 10*3/uL (ref 0.1–1.0)
Monocytes Relative: 16 %
Neutro Abs: 3.9 10*3/uL (ref 1.7–7.7)
Neutrophils Relative %: 68 %
Platelets: 222 10*3/uL (ref 150–400)
RBC: 3.61 MIL/uL — ABNORMAL LOW (ref 3.87–5.11)
RDW: 12.1 % (ref 11.5–15.5)
WBC: 5.7 10*3/uL (ref 4.0–10.5)

## 2017-02-27 LAB — BASIC METABOLIC PANEL
Anion gap: 9 (ref 5–15)
BUN: 12 mg/dL (ref 6–20)
CO2: 27 mmol/L (ref 22–32)
Calcium: 9 mg/dL (ref 8.9–10.3)
Chloride: 100 mmol/L — ABNORMAL LOW (ref 101–111)
Creatinine, Ser: 0.61 mg/dL (ref 0.44–1.00)
GFR calc Af Amer: >60 mL/min (ref >60)
GFR calc non Af Amer: >60 mL/min (ref >60)
Glucose, Bld: 132 mg/dL — ABNORMAL HIGH (ref 65–99)
Potassium: 3.3 mmol/L — ABNORMAL LOW (ref 3.5–5.1)
Sodium: 136 mmol/L (ref 135–145)

## 2017-02-27 MED ORDER — HYDROMORPHONE HCL 1 MG/ML IJ SOLN
1.0000 mg | Freq: Once | INTRAMUSCULAR | Status: AC
  Administered 2017-02-27: 1 mg via INTRAVENOUS
  Filled 2017-02-27: qty 1

## 2017-02-27 MED ORDER — SODIUM CHLORIDE 0.9 % IV BOLUS (SEPSIS)
1000.0000 mL | Freq: Once | INTRAVENOUS | Status: AC
  Administered 2017-02-27: 1000 mL via INTRAVENOUS

## 2017-02-27 MED ORDER — OXYCODONE-ACETAMINOPHEN 5-325 MG PO TABS: 1.0000 | ORAL_TABLET | ORAL | 0 refills | Status: DC | PRN

## 2017-02-27 NOTE — ED Provider Notes (Signed)
Patient seen/examined in the Emergency Department in conjunction with Midlevel Provider [place] Patient reports mechanical fall, injuring her left wrist Denies head injury.  No neck pain. Exam : awake/alert, left wrist deformity, pulses intact Plan: will need ortho consult because already has right tibial plateau fracture and will have difficulty ambulating at home     Ripley Fraise, MD 02/27/17 0423

## 2017-02-27 NOTE — Evaluation (Signed)
Physical Therapy Evaluation Patient Details Name: Misty Bullock MRN: [id number] DOB: 1956-08-19 Today's Date: 02/27/2017   History of Present Illness  61 y.o. female with R tibial plateau  and lateral malleolus fx 02/22/17 admitted with fall and L wrist fx. Pt is NWB RLE and LUE.   Clinical Impression  Pt admitted with above diagnosis. Pt currently with functional limitations due to the deficits listed below (see PT Problem List). Min A for transfers. ST-SNF recommended as pt lives alone and requires assistance for mobility and ADLs.  Pt will benefit from skilled PT to increase their independence and safety with mobility to allow discharge to the venue listed below.       Follow Up Recommendations SNF    Equipment Recommendations   (TBD at next venue)    Recommendations for Other Services       Precautions / Restrictions Precautions Precautions: Fall Required Braces or Orthoses: Sling;Knee Immobilizer - Right Knee Immobilizer - Right:  (can remove KI for ROM per MD notes) Restrictions Weight Bearing Restrictions: Yes LUE Weight Bearing: Non weight bearing RLE Weight Bearing: Non weight bearing      Mobility  Bed Mobility Overal bed mobility: Modified Independent             General bed mobility comments: HOB up 40*  Transfers Overall transfer level: Needs assistance   Transfers: Sit to/from Stand;Stand Pivot Transfers Sit to Stand: Min assist Stand pivot transfers: Min assist       General transfer comment: min A for balance, NWB RLE, pivoted on LLE to chair  Ambulation/Gait                Stairs            Wheelchair Mobility    Modified Rankin (Stroke Patients Only)       Balance Overall balance assessment: Needs assistance;History of Falls   Sitting balance-Leahy Scale: Good     Standing balance support: During functional activity Standing balance-Leahy Scale: Poor                               Pertinent Vitals/Pain  Pain Assessment: 0-10 Pain Score: 10-Worst pain ever Pain Location: L wrist Pain Descriptors / Indicators: Sharp Pain Intervention(s): Limited activity within patient's tolerance;Monitored during session;Premedicated before session;Patient requesting pain meds-RN notified;Repositioned    Home Living Family/patient expects to be discharged to:: Private residence Living Arrangements: Alone     Home Access: Stairs to enter   Entrance Stairs-Number of Steps: 3 Home Layout: One level Home Equipment: Walker - 2 wheels;Crutches;Wheelchair - manual;Bedside commode      Prior Function Level of Independence: Needs assistance   Gait / Transfers Assistance Needed: used WC with leg rests, was using crutches for past 6 days just for short distances, independent prior to that  ADL's / Homemaking Assistance Needed: used 3 in 1 in shower with assist from her mom for past 6 days, was independent prior to that        Hand Dominance   Dominant Hand: Right    Extremity/Trunk Assessment   Upper Extremity Assessment Upper Extremity Assessment: LUE deficits/detail LUE Deficits / Details: mild edema noted in fingers, encouraged AROM LUE: Unable to fully assess due to immobilization    Lower Extremity Assessment Lower Extremity Assessment: RLE deficits/detail RLE Deficits / Details: knee flexion AROM ~30* limited by pain, SLR 3/5, ankle PF/DF AROM decr 50% limited by pain  Cervical / Trunk Assessment Cervical / Trunk Assessment: Normal  Communication   Communication: No difficulties  Cognition   Behavior During Therapy: WFL for tasks assessed/performed Overall Cognitive Status: Within Functional Limits for tasks assessed                                 General Comments: sad about her dog being put down today and multiple fractures, chaplain consult requested, unit secretary notified       General Comments      Exercises General Exercises - Lower Extremity Ankle  Circles/Pumps: AROM;Right;10 reps;Seated Straight Leg Raises: AROM;Right;5 reps;Seated   Assessment/Plan    PT Assessment Patient needs continued PT services  PT Problem List Decreased range of motion;Decreased strength;Decreased activity tolerance;Decreased balance;Pain;Decreased mobility;Decreased knowledge of use of DME       PT Treatment Interventions DME instruction;Gait training;Stair training;Functional mobility training;Balance training;Therapeutic exercise;Therapeutic activities;Patient/family education    PT Goals (Current goals can be found in the Care Plan section)  Acute Rehab PT Goals Patient Stated Goal: to be independent at home, be able to shower PT Goal Formulation: With patient Time For Goal Achievement: 03/13/17 Potential to Achieve Goals: Good    Frequency Min 4X/week   Barriers to discharge Decreased caregiver support lives alone    Co-evaluation               AM-PAC PT "6 Clicks" Daily Activity  Outcome Measure Difficulty turning over in bed (including adjusting bedclothes, sheets and blankets)?: None Difficulty moving from lying on back to sitting on the side of the bed? : None Difficulty sitting down on and standing up from a chair with arms (e.g., wheelchair, bedside commode, etc,.)?: A Little Help needed moving to and from a bed to chair (including a wheelchair)?: A Little Help needed walking in hospital room?: Total Help needed climbing 3-5 steps with a railing? : Total 6 Click Score: 16    End of Session Equipment Utilized During Treatment: Gait belt Activity Tolerance: Patient limited by pain Patient left: in chair;with call bell/phone within reach Nurse Communication: Mobility status;Patient requests pain meds PT Visit Diagnosis: Unsteadiness on feet (R26.81);History of falling (Z91.81);Muscle weakness (generalized) (M62.81)    Time: 9211-9417 PT Time Calculation (min) (ACUTE ONLY): 30 min   Charges:   PT Evaluation $PT Eval  Moderate Complexity: 1 Procedure PT Treatments $Therapeutic Activity: 8-22 mins   PT G Codes:   PT G-Codes **NOT FOR INPATIENT CLASS** Functional Assessment Tool Used: AM-PAC 6 Clicks Basic Mobility Functional Limitation: Mobility: Walking and moving around Mobility: Walking and Moving Around Current Status (E0814): At least 40 percent but less than 60 percent impaired, limited or restricted Mobility: Walking and Moving Around Goal Status [phone]): At least 20 percent but less than 40 percent impaired, limited or restricted      Philomena Doheny 02/27/2017, 1:41 PM [phone]

## 2017-02-27 NOTE — Discharge Planning (Signed)
Clinical Social Work is seeking post-discharge placement for this patient at the following level of care: [facility].  Will update care team as information becomes available.

## 2017-02-27 NOTE — Progress Notes (Signed)
Orthopedic Tech Progress Note Patient Details:  Misty Bullock [birth year] [id number]  Ortho Devices Type of Ortho Device: Arm sling, Sugartong splint Ortho Device/Splint Location: lue Ortho Device/Splint Interventions: Ordered, Application   Karolee Stamps 02/27/2017, 6:27 AM

## 2017-02-27 NOTE — ED Notes (Signed)
Bed: PU92 Expected date:  Expected time:  Means of arrival:  Comments: Wrist deformity

## 2017-02-27 NOTE — ED Notes (Signed)
PTAR here to receive patient 

## 2017-02-27 NOTE — ED Notes (Signed)
Pt reports having someone take her home prescription bottles.  This RN is in touch with pharmacy to locate meds before discharge.

## 2017-02-27 NOTE — Discharge Instructions (Signed)
Take your pain medications as prescribed. Follow-up with your orthopedist at your scheduled appointment for further management of your recent fractures.

## 2017-02-27 NOTE — ED Provider Notes (Signed)
Hand-off from [employer], PA-C. Dispo pending case management consult.   See initial provider's note for full HPI. Briefly, pt is a 61 yo female with no significant PMH who presents to the ED with left wrist pain s/p mechanical fall while using her crutches. Of note, pt with recent right tibia plateau fx 6 days ago; she is nonweight bearing and on crutches. Pt followed by Dr. Rush Farmer.   Xray showed comminuted fx of left wrist. Initial provider consulted Dr. Rush Farmer who advised he will assume care for wrist fx with outpt f/u appointment this week. Sugar tong splint placed in the ED. Due to pt living at home with 19 yo mother and being unable to ambulate with multiple recent injuries, consulted case management for assistance in setting up appropriate outpatient care (nursing facility). PT consult performed in ED for CM/SW placement. Social work set up placement at nursing facility at discharge. Pt d/c home with pain meds and advised to follow up with ortho outpatient for further management of fxs.     Nona Dell, PA-C 02/27/17 1410    Ripley Fraise, MD 02/27/17 2300

## 2017-02-27 NOTE — Progress Notes (Addendum)
CSW spoke with patient via bedside regarding discharge plans. Patient stated she would like to speak with family/ friends regarding SNF decision. CSW will follow up.  2:33PM: CSW was informed that since patient has [place] authorization could take 48-72 hours. CSW discussed disposition with patient and EDPA who both agree patient is able to discharge home at this time. Patient will discharge with [facility] services and private duty assistance. CSW updated EDCM.   Kingsley Spittle, LCSWA Clinical Social Worker [phone]

## 2017-02-27 NOTE — ED Provider Notes (Signed)
[facility] DEPT Provider Note   CSN: [id number] Arrival date & time: 02/27/17  0316     History   Chief Complaint Chief Complaint  Patient presents with  . Wrist Injury    HPI Misty Bullock is a 61 y.o. female.  HPI   61 year old female presents today with wrist injury. Patient notes proximal to 6 days ago she suffered a fracture to her right tibia. She is nonweightbearing and on crutches. She In the middle the night to go to the bathroom using her crutches and fell onto an outstretched hand. She notes pain at the proximal wrist and obvious deformity. She denies any loss of sensation or color to her hand. No other injuries noted from the fall. He followed by Dr. Rush Farmer of orthopedics for her tibial fracture. She has a follow-up of evaluation this week for repeat imaging. Patient was brought via EMS given fentanyl prior to arrival.  History reviewed. No pertinent past medical history.  Patient Active Problem List   Diagnosis Date Noted  . Closed fracture of right tibial plateau 02/23/2017  . Closed nondisplaced fracture of lateral malleolus of right fibula 02/23/2017    History reviewed. No pertinent surgical history.  OB History    No data available       Home Medications    Prior to Admission medications   Medication Sig Start Date End Date Taking? Authorizing Provider  ALPRAZolam Duanne Moron) 0.5 MG tablet Take 0.5 mg by mouth daily. 01/30/17  Yes [provider]  FLUoxetine (PROZAC) 20 MG capsule Take 20 mg by mouth daily.   Yes [provider]  gabapentin (NEURONTIN) 100 MG capsule Take 100-300 mg by mouth before bed as needed for pain 02/07/17  Yes [provider]  meloxicam (MOBIC) 7.5 MG tablet Take 2 tablets (15 mg total) by mouth daily. 02/22/17  Yes Antonietta Breach, PA-C  oxyCODONE-acetaminophen (PERCOCET/ROXICET) 5-325 MG tablet Take 1-2 tablets by mouth every 6 (six) hours as needed for severe pain. 02/22/17  Yes Antonietta Breach, PA-C    zolpidem (AMBIEN) 10 MG tablet Take 10 mg by mouth at bedtime as needed for sleep.    Yes [provider]    Family History History reviewed. No pertinent family history.  Social History Social History  Substance Use Topics  . Smoking status: Never Smoker  . Smokeless tobacco: Never Used  . Alcohol use 0.0 oz/week     Comment: wine     Allergies   Latex   Review of Systems Review of Systems  All other systems reviewed and are negative.    Physical Exam Updated Vital Signs BP 137/86   Pulse 71   Temp 98.6 F (37 C) (Oral)   Resp 17   Ht 5\' 6"  (1.676 m)   Wt 122 lb (55.3 kg)   SpO2 96%   BMI 19.69 kg/m   Physical Exam  Constitutional: She is oriented to person, place, and time. She appears well-developed and well-nourished.  HENT:  Head: Normocephalic and atraumatic.  Eyes: Conjunctivae are normal. Pupils are equal, round, and reactive to light. Right eye exhibits no discharge. Left eye exhibits no discharge. No scleral icterus.  Neck: Normal range of motion. No JVD present. No tracheal deviation present.  Pulmonary/Chest: Effort normal. No stridor.  Musculoskeletal:  Obvious deformity of the left proximal wrist ; no open fracture. Radial pulse 2+, sensation intact, cap refill less than 2 seconds. Elbow nontender to palpation  Neurological: She is alert and oriented to person, place,  and time. Coordination normal.  Psychiatric: She has a normal mood and affect. Her behavior is normal. Judgment and thought content normal.  Nursing note and vitals reviewed.   ED Treatments / Results  Labs (all labs ordered are listed, but only abnormal results are displayed) Labs Reviewed  CBC WITH DIFFERENTIAL/PLATELET - Abnormal; Notable for the following:       Result Value   RBC 3.61 (*)    Hemoglobin 11.0 (*)    HCT 34.2 (*)    All other components within normal limits  BASIC METABOLIC PANEL - Abnormal; Notable for the following:    Potassium 3.3 (*)     Chloride 100 (*)    Glucose, Bld 132 (*)    All other components within normal limits    EKG  EKG Interpretation None       Radiology Dg Wrist Complete Left  Result Date: 02/27/2017 CLINICAL DATA:  Status post fall, with left wrist deformity. Initial encounter. EXAM: LEFT WRIST - COMPLETE 3+ VIEW COMPARISON:  None. FINDINGS: There is a comminuted fracture involving the distal radial metadiaphysis, with mild impaction, and mild dorsal angulation. An ulnar styloid fracture is also noted. The carpal rows are intact, and demonstrate normal alignment. Surrounding soft tissue swelling is noted at the wrist. IMPRESSION: Comminuted fracture of the distal radial metadiaphysis, with mild impaction, and mild dorsal angulation. Ulnar styloid fracture also noted. Electronically Signed   By: Garald Balding M.D.   On: 02/27/2017 03:42    Procedures Procedures (including critical care time)  SPLINT APPLICATION Date/Time: 6:26 AM Authorized by: Elmer Ramp Consent: Verbal consent obtained. Risks and benefits: risks, benefits and alternatives were discussed Consent given by: patient Splint applied by: orthopedic technician Location details: left wrist Splint type:  Supplies used: orthoglass Post-procedure: The splinted body part was neurovascularly unchanged following the procedure. Patient tolerance: Patient tolerated the procedure well with no immediate complications.     Medications Ordered in ED Medications  HYDROmorphone (DILAUDID) injection 1 mg (1 mg Intravenous Given 02/27/17 0445)  sodium chloride 0.9 % bolus 1,000 mL (0 mLs Intravenous Stopped 02/27/17 0533)  HYDROmorphone (DILAUDID) injection 1 mg (1 mg Intravenous Given 02/27/17 9485)     Initial Impression / Assessment and Plan / ED Course  I have reviewed the triage vital signs and the nursing notes.  Pertinent labs & imaging results that were available during my care of the patient were reviewed by me and considered  in my medical decision making (see chart for details).      Final Clinical Impressions(s) / ED Diagnoses   Final diagnoses:  Closed fracture of distal end of left radius, unspecified fracture morphology, initial encounter    Labs:   Imaging: DG wrist  Consults:  Therapeutics: dilaudid   Discharge Meds:   Assessment/Plan: 61 year old female presents today with a comminuted fracture of her left wrist. Patient unfortunately has a tibial plateau fracture on the right and is nonweightbearing. She lives at home with her 63 year old mother. Patient is unable to ambulate safely at home. She will not be able to tend to activities of daily living and is not a candidate for discharge home. I spoke with orthopedic surgeon Dr. Ninfa Linden who is following her for a tibial plateau fracture, he will assume care for the wrist fracture as well. Patient has a follow up appointment this week and will be assessed at that time. She'll be placed in a sugar tong splint here. Case management will consult that for assistance in providing  appropriate level of care for this patient as she will likely need nursing facility.    New Prescriptions New Prescriptions   No medications on file     Francee Gentile 02/27/17 0947    Ripley Fraise, MD 02/27/17 2259

## 2017-02-27 NOTE — ED Triage Notes (Signed)
Pt bib [facility] from home d/t left wrist injury sustained in a fall.  Pt has a RLE fx and is non weight bearing.  Pt was attempting to use her crutches when she fell tonight.  Per EMS obvious deformity to left wrist.  EMS applied SAM splint to left wrist.  Pt is A&O x 4.  Pt given 200 mcg of fentanyl en route as well as 150 ml's of NS.  Pt rating pain 10/10 initially, pain has improved w/ medication to a 4/10.

## 2017-02-27 NOTE — ED Notes (Signed)
Pt's reported medication is not with pharmacy.  Pt states she may have given them to EMS and they may have left them at her home.  Pt being sent home with script for percocet for pain.

## 2017-02-27 NOTE — Clinical Social Work Note (Signed)
Clinical Social Work Assessment  Patient Details  Name: Misty Bullock MRN: [id number] Date of Birth: 1955/11/12  Date of referral:  02/27/17               Reason for consult:  Facility Placement                Permission sought to share information with:  Facility Art therapist granted to share information::  Yes, Verbal Permission Granted  Name::        Agency::     Relationship::     Contact Information:     Housing/Transportation Living arrangements for the past 2 months:  Single Family Home Source of Information:  Patient Patient Interpreter Needed:  None Criminal Activity/Legal Involvement Pertinent to Current Situation/Hospitalization:    Significant Relationships:    Lives with:  Self Do you feel safe going back to the place where you live?  No Need for family participation in patient care:  Yes (Comment)  Care giving concerns:  Patient would like SNF once ready for discharge. Patient does not think she will be able to care for self due to recent fracture.    Social Worker assessment / plan:  CSW spoke with patient via bedside regarding discharge plans. Patient states she is unable to care for herself after recently fracture. CSW explained process to get into SNF and the need for a PT consult. Patient stated she lives in the [place] area and would prefer to find a SNF in [place]. CSW will follow up once PT has evaluated patient.   Employment status:  [employer] information:  Managed Medicare PT Recommendations:  Not assessed at this time Information / Referral to community resources:  [place]  Patient/Family's Response to care:  Patient appreciated CSW.   Patient/Family's Understanding of and Emotional Response to Diagnosis, Current Treatment, and Prognosis:  Unknown at this time.   Emotional Assessment Appearance:  Appears stated age Attitude/Demeanor/Rapport:    Affect (typically observed):  Accepting, Calm,  Pleasant Orientation:  Oriented to Self, Oriented to Place, Oriented to  Time, Oriented to Situation Alcohol / Substance use:    Psych involvement (Current and /or in the community):  No (Comment)  Discharge Needs  Concerns to be addressed:  No discharge needs identified Readmission within the last 30 days:  No Current discharge risk:  None Barriers to Discharge:  No Barriers Identified   Weston Anna, LCSW 02/27/2017, 9:29 AM

## 2017-02-27 NOTE — NC FL2 (Signed)
  [place] LEVEL OF CARE SCREENING TOOL     IDENTIFICATION  Patient Name: Ki[place] Peifer Luepke Birthdate: March 23, 1956 Sex: female Admission Date (Current Location): 02/27/2017  [facility] and [place] Number:  Herbalist and Address:  [facility],  [place] [address], [place]      Provider Number: [id number]  Attending Physician Name and Address:  No att. providers found  Relative Name and Phone Number:       Current Level of Care: Hospital Recommended Level of Care: [place] Prior Approval Number:    Date Approved/Denied:   PASRR Number:   [id number]   Discharge Plan: SNF    Current Diagnoses: Patient Active Problem List   Diagnosis Date Noted  . Closed fracture of right tibial plateau 02/23/2017  . Closed nondisplaced fracture of lateral malleolus of right fibula 02/23/2017    Orientation RESPIRATION BLADDER Height & Weight     Self, Time, Situation, Place  Normal Continent Weight: 122 lb (55.3 kg) Height:  5\' 6"  (167.6 cm)  BEHAVIORAL SYMPTOMS/MOOD NEUROLOGICAL BOWEL NUTRITION STATUS      Continent Diet (regular)  AMBULATORY STATUS COMMUNICATION OF NEEDS Skin   Limited Assist Verbally Normal                       Personal Care Assistance Level of Assistance  Bathing, Feeding, Dressing Bathing Assistance: Limited assistance Feeding assistance: Independent Dressing Assistance: Limited assistance     Functional Limitations Info             SPECIAL CARE FACTORS FREQUENCY  PT (By licensed PT), OT (By licensed OT)     PT Frequency: 5 OT Frequency: 5            Contractures      Additional Factors Info  Code Status, Allergies Code Status Info: Not on file  Allergies Info: Latex           Current Medications (02/27/2017):  This is the current hospital active medication list No current facility-administered medications for this encounter.    Current Outpatient Prescriptions  Medication  Sig Dispense Refill  . ALPRAZolam (XANAX) 0.5 MG tablet Take 0.5 mg by mouth daily.  5  . FLUoxetine (PROZAC) 20 MG capsule Take 20 mg by mouth daily.    Marland Kitchen gabapentin (NEURONTIN) 100 MG capsule Take 100-300 mg by mouth before bed as needed for pain  1  . meloxicam (MOBIC) 7.5 MG tablet Take 2 tablets (15 mg total) by mouth daily. 30 tablet 0  . oxyCODONE-acetaminophen (PERCOCET/ROXICET) 5-325 MG tablet Take 1-2 tablets by mouth every 6 (six) hours as needed for severe pain. 20 tablet 0  . zolpidem (AMBIEN) 10 MG tablet Take 10 mg by mouth at bedtime as needed for sleep.        Discharge Medications: Please see discharge summary for a list of discharge medications.  Relevant Imaging Results:  Relevant Lab Results:   Additional Information  SS#: [id number]  Weston Anna, LCSW

## 2017-03-01 ENCOUNTER — Ambulatory Visit (INDEPENDENT_AMBULATORY_CARE_PROVIDER_SITE_OTHER): Payer: Self-pay

## 2017-03-01 ENCOUNTER — Telehealth (INDEPENDENT_AMBULATORY_CARE_PROVIDER_SITE_OTHER): Payer: Self-pay | Admitting: Orthopaedic Surgery

## 2017-03-01 ENCOUNTER — Other Ambulatory Visit (INDEPENDENT_AMBULATORY_CARE_PROVIDER_SITE_OTHER): Payer: Self-pay | Admitting: Physician Assistant

## 2017-03-01 ENCOUNTER — Ambulatory Visit (INDEPENDENT_AMBULATORY_CARE_PROVIDER_SITE_OTHER): Payer: BC Managed Care – PPO | Admitting: Orthopaedic Surgery

## 2017-03-01 DIAGNOSIS — M25571 Pain in right ankle and joints of right foot: Secondary | ICD-10-CM | POA: Diagnosis not present

## 2017-03-01 DIAGNOSIS — M25561 Pain in right knee: Secondary | ICD-10-CM

## 2017-03-01 DIAGNOSIS — S52532A Colles' fracture of left radius, initial encounter for closed fracture: Secondary | ICD-10-CM

## 2017-03-01 DIAGNOSIS — S82141A Displaced bicondylar fracture of right tibia, initial encounter for closed fracture: Secondary | ICD-10-CM

## 2017-03-01 DIAGNOSIS — S8264XA Nondisplaced fracture of lateral malleolus of right fibula, initial encounter for closed fracture: Secondary | ICD-10-CM

## 2017-03-01 NOTE — Patient Instructions (Addendum)
Misty Bullock  03/01/2017   Your procedure is scheduled on: 03/03/2017   Report to [facility] Main  Entrance Take [place]  elevators to 3rd floor to  [place] at  0930 AM.     Call this number if you have problems the morning of surgery [phone]    Remember: ONLY 1 PERSON MAY GO WITH YOU TO SHORT STAY TO GET  READY MORNING OF [name].  Do not eat food or drink liquids :After Midnight.     Take these medicines the morning of surgery with A SIP OF WATER: Xanax, prozac, Oxycodone if needed                                 You may not have any metal on your body including hair pins and              piercings  Do not wear jewelry, make-up, lotions, powders or perfumes, deodorant             Do not wear nail polish.  Do not shave  48 hours prior to surgery.                 Do not bring valuables to the hospital. [place].  Contacts, dentures or bridgework may not be worn into surgery.  Leave suitcase in the car. After surgery it may be brought to your room.                       Please read over the following fact sheets you were given: _____________________________________________________________________             [facility] - Preparing for Surgery Before surgery, you can play an important role.  Because skin is not sterile, your skin needs to be as free of germs as possible.  You can reduce the number of germs on your skin by washing with CHG (chlorahexidine gluconate) soap before surgery.  CHG is an antiseptic cleaner which kills germs and bonds with the skin to continue killing germs even after washing. Please DO NOT use if you have an allergy to CHG or antibacterial soaps.  If your skin becomes reddened/irritated stop using the CHG and inform your nurse when you arrive at Short Stay. Do not shave (including legs and underarms) for at least 48 hours prior to the first CHG shower.  You may  shave your face/neck. Please follow these instructions carefully:  1.  Shower with CHG Soap the night before surgery and the  morning of Surgery.  2.  If you choose to wash your hair, wash your hair first as usual with your  normal  shampoo.  3.  After you shampoo, rinse your hair and body thoroughly to remove the  shampoo.                           4.  Use CHG as you would any other liquid soap.  You can apply chg directly  to the skin and wash                       Gently with a  scrungie or clean washcloth.  5.  Apply the CHG Soap to your body ONLY FROM THE NECK DOWN.   Do not use on face/ open                           Wound or open sores. Avoid contact with eyes, ears mouth and genitals (private parts).                       Wash face,  Genitals (private parts) with your normal soap.             6.  Wash thoroughly, paying special attention to the area where your surgery  will be performed.  7.  Thoroughly rinse your body with warm water from the neck down.  8.  DO NOT shower/wash with your normal soap after using and rinsing off  the CHG Soap.                9.  Pat yourself dry with a clean towel.            10.  Wear clean pajamas.            11.  Place clean sheets on your bed the night of your first shower and do not  sleep with pets. Day of Surgery : Do not apply any lotions/deodorants the morning of surgery.  Please wear clean clothes to the hospital/surgery center.  FAILURE TO FOLLOW THESE INSTRUCTIONS MAY RESULT IN THE CANCELLATION OF YOUR SURGERY PATIENT SIGNATURE_________________________________  NURSE SIGNATURE__________________________________  ________________________________________________________________________   [name]  An incentive spirometer is a tool that can help keep your lungs clear and active. This tool measures how well you are filling your lungs with each breath. Taking long deep breaths may help reverse or decrease the chance of developing  breathing (pulmonary) problems (especially infection) following:  A long period of time when you are unable to move or be active. BEFORE THE PROCEDURE   If the spirometer includes an indicator to show your best effort, your nurse or respiratory therapist will set it to a desired goal.  If possible, sit up straight or lean slightly forward. Try not to slouch.  Hold the incentive spirometer in an upright position. INSTRUCTIONS FOR USE  1. Sit on the edge of your bed if possible, or sit up as far as you can in bed or on a chair. 2. Hold the incentive spirometer in an upright position. 3. Breathe out normally. 4. Place the mouthpiece in your mouth and seal your lips tightly around it. 5. Breathe in slowly and as deeply as possible, raising the piston or the ball toward the top of the column. 6. Hold your breath for 3-5 seconds or for as long as possible. Allow the piston or ball to fall to the bottom of the column. 7. Remove the mouthpiece from your mouth and breathe out normally. 8. Rest for a few seconds and repeat Steps 1 through 7 at least 10 times every 1-2 hours when you are awake. Take your time and take a few normal breaths between deep breaths. 9. The spirometer may include an indicator to show your best effort. Use the indicator as a goal to work toward during each repetition. 10. After each set of 10 deep breaths, practice coughing to be sure your lungs are clear. If you have an incision (the cut made at the time of surgery), support your incision  when coughing by placing a pillow or rolled up towels firmly against it. Once you are able to get out of bed, walk around indoors and cough well. You may stop using the incentive spirometer when instructed by your caregiver.  RISKS AND COMPLICATIONS  Take your time so you do not get dizzy or light-headed.  If you are in pain, you may need to take or ask for pain medication before doing incentive spirometry. It is harder to take a deep  breath if you are having pain. AFTER USE  Rest and breathe slowly and easily.  It can be helpful to keep track of a log of your progress. Your caregiver can provide you with a simple table to help with this. If you are using the spirometer at home, follow these instructions: [place] IF:   You are having difficultly using the spirometer.  You have trouble using the spirometer as often as instructed.  Your pain medication is not giving enough relief while using the spirometer.  You develop fever of 100.5 F (38.1 C) or higher. SEEK IMMEDIATE MEDICAL CARE IF:   You cough up bloody sputum that had not been present before.  You develop fever of 102 F (38.9 C) or greater.  You develop worsening pain at or near the incision site. MAKE SURE YOU:   Understand these instructions.  Will watch your condition.  Will get help right away if you are not doing well or get worse. Document Released: 02/06/2007 Document Revised: 12/19/2011 Document Reviewed: 04/09/2007 [facility] Patient Information 2014 [place], [place].   ________________________________________________________________________

## 2017-03-01 NOTE — Progress Notes (Signed)
Please place orders in EPIC as patient is being scheduled for a pre-op appointment! Thank you! 

## 2017-03-01 NOTE — Telephone Encounter (Signed)
Verbal order given  

## 2017-03-01 NOTE — Progress Notes (Signed)
Office Visit Note   Patient: Misty Bullock           Date of Birth: 29-Aug-1956           MRN: [id number] Visit Date: 03/01/2017              Requested by: Arvella Nigh, [address] [place], [place] [address] PCP: Arvella Nigh, MD   Assessment & Plan: Visit Diagnoses:  1. Acute pain of right knee   2. Pain in right ankle and joints of right foot   3. Closed nondisplaced fracture of lateral malleolus of right fibula, initial encounter   4. Closed fracture of right tibial plateau, initial encounter   5. Closed Colles' fracture of left radius, initial encounter     Plan: The patient understands fully that I'm recommending open reduction-internal fixation of her left distal radius due to the angulation and shortening of the fracture. I removed splint we'll place her in a Velcro wrist splint today with this planning to set surgery up for later this week. I showed her a model of the distal radius and went over x-rays and explained in detail with the surgery involves we had thorough discussion of the risk medicine surgery. I'll then have her in the hospital overnight at least and work on rehabilitation so she doesn't fall again and hopefully. We'll see her back in 2 weeks postoperative and I'll like an AP and lateral of her left wrist at that visit as well as an AP and lateral of her left knee at that visit. We do not need to x-ray the ankle.  Follow-Up Instructions: Return for 2 weeks post-op.   Orders:  Orders Placed This Encounter  Procedures  . XR Knee 1-2 Views Right  . XR Ankle 2 Views Right   No orders of the defined types were placed in this encounter.     Procedures: No procedures performed   Clinical Data: No additional findings.   Subjective: No chief complaint on file. The patient is well-known to me. Today's visit was to follow-up for a right tibial plateau fracture and right lateral malleolus fracture. However since of seeing her last week she had  another mechanical fall injuring her left wrist. She was seen in emergency room by the ER staff and found to have a displaced angulated distal radius fracture was placed appropriate in her splint. Were seeing her today for that new injury on her left side as well as the subacute injuries on the right knee and right ankle. She's been having trouble ambulating and this led to her recent fall. She reports left wrist pain as well as right knee pain and right ankle pain.  HPI  Review of Systems She denies any headache, chest pain, shortness of breath, fever, chills, nausea, vomiting.  Objective: Vital Signs: There were no vitals taken for this visit.  Physical Exam She is alert and oriented 3 and in a wheelchair. She is in no acute distress Ortho Exam Her left wrist is splinted. She moves her fingers and thumbs easily. Her right knee is still painful and swollen in her right ankle is swollen. Her right lower extremity is neurovascular intact. Her left upper extremity is neurovascularly intact. Specialty Comments:  No specialty comments available.  Imaging: Xr Ankle 2 Views Right  Result Date: 03/01/2017 2 views of the right ankle show a nondisplaced lateral malleolus fracture that is stable. The ankle mortise is well located. The fracture is nondisplaced.  Xr Knee 1-2 Views Right  Result Date: 03/01/2017 2 views of the right knee show a minimally displaced lateral tibial plateau fracture that has not changed over previous x-rays. It still appears stable.  X-rays on the canopy system and apparently reviewed a left distal radius show an extra articular displaced shortened and angulated distal radius fracture.  PMFS History: Patient Active Problem List   Diagnosis Date Noted  . Fracture, Colles, left, closed 03/01/2017  . Closed fracture of right tibial plateau 02/23/2017  . Closed nondisplaced fracture of lateral malleolus of right fibula 02/23/2017   No past medical history on file.    No family history on file.  No past surgical history on file. Social History   Occupational History  . Not on file.   Social History Main Topics  . Smoking status: Never Smoker  . Smokeless tobacco: Never Used  . Alcohol use 0.0 oz/week     Comment: wine  . Drug use: No  . Sexual activity: Not on file

## 2017-03-01 NOTE — Telephone Encounter (Signed)
[facility] REQUESTING ORDERS FOR PT ADMISSION ONLY. WANTS TO SEE TODAY.  HEEATHER [phone]

## 2017-03-02 ENCOUNTER — Encounter (HOSPITAL_COMMUNITY)
Admission: RE | Admit: 2017-03-02 | Discharge: 2017-03-02 | Disposition: A | Discharge status: Home / Self Care | Payer: BC Managed Care – PPO | Source: Ambulatory Visit | Attending: Orthopaedic Surgery | Admitting: Orthopaedic Surgery

## 2017-03-02 ENCOUNTER — Encounter (HOSPITAL_COMMUNITY): Payer: Self-pay

## 2017-03-02 DIAGNOSIS — Z91040 Latex allergy status: Secondary | ICD-10-CM | POA: Diagnosis not present

## 2017-03-02 DIAGNOSIS — Z791 Long term (current) use of non-steroidal anti-inflammatories (NSAID): Secondary | ICD-10-CM | POA: Diagnosis not present

## 2017-03-02 DIAGNOSIS — S52612A Displaced fracture of left ulna styloid process, initial encounter for closed fracture: Secondary | ICD-10-CM | POA: Diagnosis not present

## 2017-03-02 DIAGNOSIS — Z79899 Other long term (current) drug therapy: Secondary | ICD-10-CM | POA: Diagnosis not present

## 2017-03-02 DIAGNOSIS — W010XXA Fall on same level from slipping, tripping and stumbling without subsequent striking against object, initial encounter: Secondary | ICD-10-CM | POA: Diagnosis not present

## 2017-03-02 DIAGNOSIS — S52532A Colles' fracture of left radius, initial encounter for closed fracture: Secondary | ICD-10-CM | POA: Diagnosis not present

## 2017-03-02 DIAGNOSIS — F418 Other specified anxiety disorders: Secondary | ICD-10-CM | POA: Diagnosis not present

## 2017-03-02 DIAGNOSIS — M25532 Pain in left wrist: Secondary | ICD-10-CM | POA: Diagnosis present

## 2017-03-02 LAB — CBC
HEMATOCRIT: 33.2 % — AB (ref 36.0–46.0)
HEMOGLOBIN: 10.6 g/dL — AB (ref 12.0–15.0)
MCH: 30.6 pg (ref 26.0–34.0)
MCHC: 31.9 g/dL (ref 30.0–36.0)
MCV: 96 fL (ref 78.0–100.0)
Platelets: 336 10*3/uL (ref 150–400)
RBC: 3.46 MIL/uL — ABNORMAL LOW (ref 3.87–5.11)
RDW: 12.5 % (ref 11.5–15.5)
WBC: 5.7 10*3/uL (ref 4.0–10.5)

## 2017-03-02 NOTE — Progress Notes (Signed)
CBC done 03/02/17 faxed via epic to Dr Zollie Beckers.

## 2017-03-03 ENCOUNTER — Ambulatory Visit (HOSPITAL_COMMUNITY): Payer: BC Managed Care – PPO | Admitting: Anesthesiology

## 2017-03-03 ENCOUNTER — Encounter (HOSPITAL_COMMUNITY)
Admission: AD | Disposition: A | Discharge status: Home / Self Care | Payer: Self-pay | Source: Ambulatory Visit | Attending: Orthopaedic Surgery

## 2017-03-03 ENCOUNTER — Observation Stay (HOSPITAL_COMMUNITY)
Admission: AD | Admit: 2017-03-03 | Discharge: 2017-03-05 | DRG: 512 | Disposition: A | Discharge status: Home / Self Care | Payer: BC Managed Care – PPO | Source: Ambulatory Visit | Attending: Orthopaedic Surgery | Admitting: Orthopaedic Surgery

## 2017-03-03 ENCOUNTER — Encounter (HOSPITAL_COMMUNITY): Payer: Self-pay

## 2017-03-03 DIAGNOSIS — S52502A Unspecified fracture of the lower end of left radius, initial encounter for closed fracture: Secondary | ICD-10-CM | POA: Diagnosis present

## 2017-03-03 DIAGNOSIS — Z91040 Latex allergy status: Secondary | ICD-10-CM

## 2017-03-03 DIAGNOSIS — Z79899 Other long term (current) drug therapy: Secondary | ICD-10-CM

## 2017-03-03 DIAGNOSIS — S52532A Colles' fracture of left radius, initial encounter for closed fracture: Secondary | ICD-10-CM | POA: Diagnosis not present

## 2017-03-03 DIAGNOSIS — W010XXA Fall on same level from slipping, tripping and stumbling without subsequent striking against object, initial encounter: Secondary | ICD-10-CM | POA: Diagnosis present

## 2017-03-03 DIAGNOSIS — Z791 Long term (current) use of non-steroidal anti-inflammatories (NSAID): Secondary | ICD-10-CM | POA: Insufficient documentation

## 2017-03-03 DIAGNOSIS — S52612A Displaced fracture of left ulna styloid process, initial encounter for closed fracture: Secondary | ICD-10-CM | POA: Insufficient documentation

## 2017-03-03 DIAGNOSIS — F418 Other specified anxiety disorders: Secondary | ICD-10-CM | POA: Insufficient documentation

## 2017-03-03 HISTORY — PX: OPEN REDUCTION INTERNAL FIXATION (ORIF) DISTAL RADIAL FRACTURE: SHX5989

## 2017-03-03 SURGERY — OPEN REDUCTION INTERNAL FIXATION (ORIF) DISTAL RADIUS FRACTURE
Anesthesia: Regional | Laterality: Left

## 2017-03-03 MED ORDER — DIPHENHYDRAMINE HCL 12.5 MG/5ML PO ELIX: 12.5000 mg | ORAL_SOLUTION | ORAL | Status: DC | PRN

## 2017-03-03 MED ORDER — MIDAZOLAM HCL 2 MG/2ML IJ SOLN
2.0000 mg | Freq: Once | INTRAMUSCULAR | Status: AC
  Administered 2017-03-03: 1 mg via INTRAVENOUS

## 2017-03-03 MED ORDER — DEXAMETHASONE SODIUM PHOSPHATE 10 MG/ML IJ SOLN
INTRAMUSCULAR | Status: AC
  Filled 2017-03-03: qty 1

## 2017-03-03 MED ORDER — FENTANYL CITRATE (PF) 100 MCG/2ML IJ SOLN
INTRAMUSCULAR | Status: AC
  Filled 2017-03-03: qty 2

## 2017-03-03 MED ORDER — ONDANSETRON HCL 4 MG/2ML IJ SOLN
INTRAMUSCULAR | Status: AC
  Filled 2017-03-03: qty 2

## 2017-03-03 MED ORDER — CHLORHEXIDINE GLUCONATE 4 % EX LIQD: 60.0000 mL | Freq: Once | CUTANEOUS | Status: DC

## 2017-03-03 MED ORDER — CEFAZOLIN SODIUM-DEXTROSE 1-4 GM/50ML-% IV SOLN
1.0000 g | Freq: Four times a day (QID) | INTRAVENOUS | Status: AC
  Administered 2017-03-03 – 2017-03-04 (×3): 1 g via INTRAVENOUS
  Filled 2017-03-03 (×3): qty 50

## 2017-03-03 MED ORDER — METHOCARBAMOL 500 MG PO TABS
500.0000 mg | ORAL_TABLET | Freq: Four times a day (QID) | ORAL | Status: DC | PRN
  Filled 2017-03-03: qty 1

## 2017-03-03 MED ORDER — METOCLOPRAMIDE HCL 5 MG PO TABS: 5.0000 mg | ORAL_TABLET | Freq: Three times a day (TID) | ORAL | Status: DC | PRN

## 2017-03-03 MED ORDER — LIDOCAINE 2% (20 MG/ML) 5 ML SYRINGE
INTRAMUSCULAR | Status: AC
  Filled 2017-03-03: qty 5

## 2017-03-03 MED ORDER — ZOLPIDEM TARTRATE 5 MG PO TABS
5.0000 mg | ORAL_TABLET | Freq: Every evening | ORAL | Status: DC | PRN
  Administered 2017-03-03 – 2017-03-04 (×2): 5 mg via ORAL
  Filled 2017-03-03 (×2): qty 1

## 2017-03-03 MED ORDER — FENTANYL CITRATE (PF) 250 MCG/5ML IJ SOLN
INTRAMUSCULAR | Status: AC
  Filled 2017-03-03: qty 5

## 2017-03-03 MED ORDER — PROPOFOL 10 MG/ML IV BOLUS
INTRAVENOUS | Status: DC | PRN
  Administered 2017-03-03: 20 mg via INTRAVENOUS
  Administered 2017-03-03: 140 mg via INTRAVENOUS
  Administered 2017-03-03: 20 mg via INTRAVENOUS

## 2017-03-03 MED ORDER — PROPOFOL 10 MG/ML IV BOLUS
INTRAVENOUS | Status: AC
  Filled 2017-03-03: qty 20

## 2017-03-03 MED ORDER — FENTANYL CITRATE (PF) 100 MCG/2ML IJ SOLN
25.0000 ug | INTRAMUSCULAR | Status: DC | PRN
  Administered 2017-03-03 (×3): 50 ug via INTRAVENOUS

## 2017-03-03 MED ORDER — FLUOXETINE HCL 20 MG PO CAPS
20.0000 mg | ORAL_CAPSULE | Freq: Every day | ORAL | Status: DC
  Administered 2017-03-04 – 2017-03-05 (×2): 20 mg via ORAL
  Filled 2017-03-03 (×2): qty 1

## 2017-03-03 MED ORDER — SODIUM CHLORIDE 0.9 % IR SOLN
Status: DC | PRN
  Administered 2017-03-03: 1000 mL

## 2017-03-03 MED ORDER — ONDANSETRON HCL 4 MG/2ML IJ SOLN
INTRAMUSCULAR | Status: DC | PRN
  Administered 2017-03-03: 4 mg via INTRAVENOUS

## 2017-03-03 MED ORDER — MIDAZOLAM HCL 2 MG/2ML IJ SOLN
INTRAMUSCULAR | Status: AC
  Filled 2017-03-03: qty 2

## 2017-03-03 MED ORDER — ACETAMINOPHEN 650 MG RE SUPP: 650.0000 mg | Freq: Four times a day (QID) | RECTAL | Status: DC | PRN

## 2017-03-03 MED ORDER — OXYCODONE HCL 5 MG PO TABS
5.0000 mg | ORAL_TABLET | ORAL | Status: DC | PRN
  Administered 2017-03-03 – 2017-03-04 (×3): 5 mg via ORAL
  Filled 2017-03-03: qty 1
  Filled 2017-03-03: qty 2
  Filled 2017-03-03: qty 1

## 2017-03-03 MED ORDER — METOCLOPRAMIDE HCL 5 MG/ML IJ SOLN: 5.0000 mg | Freq: Three times a day (TID) | INTRAMUSCULAR | Status: DC | PRN

## 2017-03-03 MED ORDER — DEXAMETHASONE SODIUM PHOSPHATE 10 MG/ML IJ SOLN
INTRAMUSCULAR | Status: DC | PRN
  Administered 2017-03-03: 10 mg via INTRAVENOUS

## 2017-03-03 MED ORDER — ONDANSETRON HCL 4 MG PO TABS: 4.0000 mg | ORAL_TABLET | Freq: Four times a day (QID) | ORAL | Status: DC | PRN

## 2017-03-03 MED ORDER — SODIUM CHLORIDE 0.9 % IV SOLN
INTRAVENOUS | Status: DC
  Administered 2017-03-03: 17:00:00 via INTRAVENOUS

## 2017-03-03 MED ORDER — ALPRAZOLAM 0.5 MG PO TABS
0.5000 mg | ORAL_TABLET | Freq: Every day | ORAL | Status: DC
  Administered 2017-03-03 – 2017-03-05 (×3): 0.5 mg via ORAL
  Filled 2017-03-03 (×3): qty 1

## 2017-03-03 MED ORDER — METHOCARBAMOL 1000 MG/10ML IJ SOLN
500.0000 mg | Freq: Four times a day (QID) | INTRAMUSCULAR | Status: DC | PRN
  Administered 2017-03-03: 500 mg via INTRAVENOUS
  Filled 2017-03-03: qty 550

## 2017-03-03 MED ORDER — LACTATED RINGERS IV SOLN
INTRAVENOUS | Status: DC
  Administered 2017-03-03 (×2): via INTRAVENOUS

## 2017-03-03 MED ORDER — ASPIRIN 325 MG PO TABS
325.0000 mg | ORAL_TABLET | Freq: Every day | ORAL | Status: DC
  Administered 2017-03-03 – 2017-03-05 (×3): 325 mg via ORAL
  Filled 2017-03-03 (×3): qty 1

## 2017-03-03 MED ORDER — CEFAZOLIN SODIUM-DEXTROSE 2-4 GM/100ML-% IV SOLN
2.0000 g | INTRAVENOUS | Status: AC
  Administered 2017-03-03: 2 g via INTRAVENOUS
  Filled 2017-03-03: qty 100

## 2017-03-03 MED ORDER — FENTANYL CITRATE (PF) 100 MCG/2ML IJ SOLN
INTRAMUSCULAR | Status: DC | PRN
Start: 2017-03-03 — End: 2017-03-03
  Administered 2017-03-03 (×3): 50 ug via INTRAVENOUS

## 2017-03-03 MED ORDER — FENTANYL CITRATE (PF) 100 MCG/2ML IJ SOLN
100.0000 ug | Freq: Once | INTRAMUSCULAR | Status: AC
  Administered 2017-03-03: 50 ug via INTRAVENOUS

## 2017-03-03 MED ORDER — LIDOCAINE HCL (CARDIAC) 20 MG/ML IV SOLN
INTRAVENOUS | Status: DC | PRN
  Administered 2017-03-03: 50 mg via INTRAVENOUS

## 2017-03-03 MED ORDER — ONDANSETRON HCL 4 MG/2ML IJ SOLN: 4.0000 mg | Freq: Four times a day (QID) | INTRAMUSCULAR | Status: DC | PRN

## 2017-03-03 MED ORDER — HYDROMORPHONE HCL 1 MG/ML IJ SOLN
1.0000 mg | INTRAMUSCULAR | Status: DC | PRN
  Administered 2017-03-03: 14:00:00 1 mg via INTRAVENOUS
  Filled 2017-03-03: qty 1

## 2017-03-03 MED ORDER — SODIUM CHLORIDE 0.9 % IR SOLN
Status: AC
  Filled 2017-03-03: qty 500000

## 2017-03-03 MED ORDER — ROPIVACAINE HCL 5 MG/ML IJ SOLN
INTRAMUSCULAR | Status: DC | PRN
  Administered 2017-03-03: 30 mL via PERINEURAL

## 2017-03-03 MED ORDER — ACETAMINOPHEN 325 MG PO TABS: 650.0000 mg | ORAL_TABLET | Freq: Four times a day (QID) | ORAL | Status: DC | PRN

## 2017-03-03 MED ORDER — ONDANSETRON HCL 4 MG/2ML IJ SOLN: 4.0000 mg | Freq: Once | INTRAMUSCULAR | Status: DC | PRN

## 2017-03-03 SURGICAL SUPPLY — 51 items
BAG ZIPLOCK 12X15 (MISCELLANEOUS) ×3 IMPLANT
BANDAGE ACE 3X5.8 VEL STRL LF (GAUZE/BANDAGES/DRESSINGS) ×3 IMPLANT
BANDAGE ACE 4X5 VEL STRL LF (GAUZE/BANDAGES/DRESSINGS) ×3 IMPLANT
BANDAGE ACE 6X5 VEL STRL LF (GAUZE/BANDAGES/DRESSINGS) ×3 IMPLANT
BIT DRILL 2 FAST STEP (BIT) ×3 IMPLANT
BIT DRILL 2.5X4 QC (BIT) ×3 IMPLANT
CLOSURE WOUND 1/2 X4 (GAUZE/BANDAGES/DRESSINGS) ×1
COVER SURGICAL LIGHT HANDLE (MISCELLANEOUS) ×3 IMPLANT
CUFF TOURN SGL QUICK 18 (TOURNIQUET CUFF) ×3 IMPLANT
DRAPE C-ARM 42X120 X-RAY (DRAPES) ×3 IMPLANT
DRAPE OEC MINIVIEW 54X84 (DRAPES) ×3 IMPLANT
DRAPE U-SHAPE 47X51 STRL (DRAPES) ×3 IMPLANT
DRSG ADAPTIC 3X8 NADH LF (GAUZE/BANDAGES/DRESSINGS) ×3 IMPLANT
DRSG PAD ABDOMINAL 8X10 ST (GAUZE/BANDAGES/DRESSINGS) ×3 IMPLANT
DURAPREP 26ML APPLICATOR (WOUND CARE) ×3 IMPLANT
ELECT REM PT RETURN 15FT ADLT (MISCELLANEOUS) ×3 IMPLANT
GAUZE SPONGE 4X4 12PLY STRL (GAUZE/BANDAGES/DRESSINGS) ×3 IMPLANT
GAUZE XEROFORM 1X8 LF (GAUZE/BANDAGES/DRESSINGS) ×3 IMPLANT
GLOVE BIO SURGEON STRL SZ7.5 (GLOVE) ×3 IMPLANT
GLOVE BIOGEL PI IND STRL 8 (GLOVE) ×1 IMPLANT
GLOVE BIOGEL PI INDICATOR 8 (GLOVE) ×2
GLOVE ECLIPSE 8.0 STRL XLNG CF (GLOVE) ×3 IMPLANT
GOWN STRL REUS W/TWL XL LVL3 (GOWN DISPOSABLE) ×3 IMPLANT
K-WIRE 1.6 (WIRE) ×2
K-WIRE FX5X1.6XNS BN SS (WIRE) ×1
KIT BASIN OR (CUSTOM PROCEDURE TRAY) ×3 IMPLANT
KWIRE FX5X1.6XNS BN SS (WIRE) ×1 IMPLANT
NS IRRIG 1000ML POUR BTL (IV SOLUTION) ×3 IMPLANT
PACK ORTHO EXTREMITY (CUSTOM PROCEDURE TRAY) ×3 IMPLANT
PAD CAST 3X4 CTTN HI CHSV (CAST SUPPLIES) ×1 IMPLANT
PAD CAST 4YDX4 CTTN HI CHSV (CAST SUPPLIES) ×1 IMPLANT
PADDING CAST COTTON 3X4 STRL (CAST SUPPLIES) ×2
PADDING CAST COTTON 4X4 STRL (CAST SUPPLIES) ×2
PADDING CAST COTTON 6X4 STRL (CAST SUPPLIES) ×3 IMPLANT
PEG SUBCHONDRAL SMOOTH 2.0X20 (Peg) ×9 IMPLANT
PEG SUBCHONDRAL SMOOTH 2.0X22 (Peg) ×3 IMPLANT
PEG SUBCHONDRAL SMOOTH 2.0X24 (Peg) ×9 IMPLANT
PLATE SHORT 24.4X51.3 LT (Plate) ×3 IMPLANT
POSITIONER SURGICAL ARM (MISCELLANEOUS) ×3 IMPLANT
SCREW BN 12X3.5XNS CORT TI (Screw) ×3 IMPLANT
SCREW CORT 3.5X12 (Screw) ×6 IMPLANT
STRIP CLOSURE SKIN 1/2X4 (GAUZE/BANDAGES/DRESSINGS) ×2 IMPLANT
SUT MNCRL AB 4-0 PS2 18 (SUTURE) ×3 IMPLANT
SUT VIC AB 0 CT1 27 (SUTURE) ×4
SUT VIC AB 0 CT1 27XBRD ANTBC (SUTURE) ×2 IMPLANT
SUT VIC AB 2-0 CT1 27 (SUTURE) ×2
SUT VIC AB 2-0 CT1 TAPERPNT 27 (SUTURE) ×1 IMPLANT
TOWEL OR 17X26 10 PK STRL BLUE (TOWEL DISPOSABLE) ×6 IMPLANT
TOWEL OR NON WOVEN STRL DISP B (DISPOSABLE) ×3 IMPLANT
UNDERPAD 30X30 (UNDERPADS AND DIAPERS) ×3 IMPLANT
WATER STERILE IRR 1500ML POUR (IV SOLUTION) ×3 IMPLANT

## 2017-03-03 NOTE — Op Note (Deleted)
  The note originally documented on this encounter has been moved the the encounter in which it belongs.  

## 2017-03-03 NOTE — Brief Op Note (Signed)
03/03/2017  12:36 PM  PATIENT:  Misty Bullock  61 y.o. female  PRE-OPERATIVE DIAGNOSIS:  displaced left distal radius fracture  POST-OPERATIVE DIAGNOSIS:  displaced left distal radius fracture  PROCEDURE:  Procedure(s): OPEN REDUCTION INTERNAL FIXATION (ORIF) LEFT DISTAL RADIUS FRACTURE (Left)  SURGEON:  Surgeon(s) and Role:    Mcarthur Rossetti, MD - Primary  PHYSICIAN ASSISTANT: Benita Stabile, PA-C  ANESTHESIA:   regional and general  EBL:  Total I/O In: 1000 [I.V.:1000] Out: -   COUNTS:  YES  TOURNIQUET:  * Missing tourniquet times found for documented tourniquets in log:  110315 *  DICTATION: .Other Dictation: Dictation Number [id number]  PLAN OF CARE: Admit to inpatient   PATIENT DISPOSITION:  PACU - hemodynamically stable.   Delay start of Pharmacological VTE agent (>24hrs) due to surgical blood loss or risk of bleeding: no

## 2017-03-03 NOTE — Progress Notes (Signed)
Assisted Dr. Ellender with left, ultrasound guided, supraclavicular block. Side rails up, monitors on throughout procedure. See vital signs in flow sheet. Tolerated Procedure well. 

## 2017-03-03 NOTE — Transfer of Care (Signed)
Immediate Anesthesia Transfer of Care Note  Patient: Misty Bullock  Procedure(s) Performed: Procedure(s): OPEN REDUCTION INTERNAL FIXATION (ORIF) LEFT DISTAL RADIUS FRACTURE (Left)  Patient Location: PACU  Anesthesia Type:General  Level of Consciousness: awake, alert  and oriented  Airway & Oxygen Therapy: Patient Spontanous Breathing and Patient connected to face mask oxygen  Post-op Assessment: Report given to RN and Post -op Vital signs reviewed and stable  Post vital signs: Reviewed and stable  Last Vitals:  Vitals:   03/03/17 1122 03/03/17 1123  BP:    Pulse: 68 69  Resp: (!) 22 (!) 22  Temp:      Last Pain:  Vitals:   03/03/17 0940  TempSrc:   PainSc: 0-No pain         Complications: No apparent anesthesia complications

## 2017-03-03 NOTE — Op Note (Signed)
NAME:  Misty Bullock, GUIDOTTI                   ACCOUNT NO.:  [id number]  MEDICAL RECORD NO.:  [id number]  LOCATION:                                 FACILITY:  PHYSICIAN:  Lind Guest. Ninfa Linden, M.D.DATE OF BIRTH:  04/22/1956  DATE OF PROCEDURE:  03/03/2017 DATE OF DISCHARGE:                              OPERATIVE REPORT   PREOPERATIVE DIAGNOSIS:  Left extra-articular displaced and angulated distal radius fracture.  POSTOPERATIVE DIAGNOSIS:  Left extra-articular displaced and angulated distal radius fracture.  PROCEDURE:  Open reduction internal fixation of left distal radius fracture using volar plating.  IMPLANTS:  [employer] distal radial volar plate with locking pegs distally and bicortical screws proximally.  SURGEON:  Lind Guest. Ninfa Linden, MD.  ASSISTANT:  Erskine Emery, PA-C.  ANESTHESIA: 1. Left upper extremity regional block. 2. General.  BLOOD LOSS:  Minimal.  TOURNIQUET TIME:  Under 1 hour.  ANTIBIOTICS:  2 g of IV Ancef.  COMPLICATIONS:  None.  INDICATIONS:  [name] is a 61 year old female, who unfortunately sustained a mechanical fall just this past Sunday, injuring her left nondominant wrist.  She sustained a left distal radius fracture with angulation and shortening.  We recommended open reduction internal fixation of this fracture.  A thorough discussion of risks and benefits was had with her as well.  We are also wanting to fix the risk because she has been dealing with a nondisplaced right lateral malleolus fracture of her ankle and a right tibial plateau fracture that are both nonoperative, but she has been nonweightbearing on that side and had some significant balance tissue since then.  The fall injuring her right knee and her right wrist were 2 weeks ago and now this is just a week ago from her left wrist.  PROCEDURE DESCRIPTION:  After informed consent was obtained, appropriate left wrist was marked, anesthesia obtained a regional upper  extremity block, she was brought to the operating room, placed supine on the operating table with her left arm on a radiolucent arm table.  General anesthesia was then obtained.  A nonsterile tourniquet was placed around her upper left arm, her left hand wrist, and forearm were prepped and draped with DuraPrep and sterile drapes.  A time-out was called and she was identified as a correct patient, correct left wrist.  I then made a volar incision over the distal radius.  I dissected down through the interval between the flexor carpi radialis tendon and the radial artery. We were able to retract the radial artery.  We then identified the pronator quadratus and excised this off the bone from a radial to ulnar direction.  We were able to identify the fracture and then reduced it with some traction and palmar flexion.  We then chose a standard width, but short distal radial volar plate from [employer] for a left wrist.  We were able to temporarily secure with K-wires and verified to place under direct fluoroscopy.  We then placed bicortical screws proximally and locking pegs distally.  Once we had reduced the fracture. We put her wrist through the range of motion.  We were pleased with the stability of the fracture and the range  of motion of the wrist.  We then irrigated the soft tissue with normal saline solution.  I reapproximated the pronator, greater quadratus with 0 Vicryl, followed by closing the subcuticular tissue with interrupted 2-0 Vicryl suture, interrupted 3-0 nylon on the skin.  Xeroform and well-padded sterile dressing was applied.  The tourniquet was let down.  Her fingers pinked nicely.  She was taken to the recovery room in stable condition.  All final counts were correct.  There were no complications noted.  Of note, Erskine Emery, PA-C, assisted in the entire case.  His assistance was crucial for facilitating all aspects of this case.     Lind Guest. Ninfa Linden,  M.D.   ______________________________ Lind Guest. Ninfa Linden, M.D.    CYB/MEDQ  D:  03/03/2017  T:  03/03/2017  Job:  [id number]

## 2017-03-03 NOTE — Anesthesia Procedure Notes (Signed)
Procedure Name: LMA Insertion Date/Time: 03/03/2017 11:37 AM Performed by: Glory Buff Pre-anesthesia Checklist: Patient identified, Emergency Drugs available, Suction available and Patient being monitored Patient Re-evaluated:Patient Re-evaluated prior to inductionOxygen Delivery Method: Circle system utilized Preoxygenation: Pre-oxygenation with 100% oxygen Intubation Type: IV induction LMA: LMA inserted LMA Size: 4.0 Number of attempts: 1 Placement Confirmation: positive ETCO2 Tube secured with: Tape Dental Injury: Teeth and Oropharynx as per pre-operative assessment

## 2017-03-03 NOTE — Anesthesia Procedure Notes (Signed)
Anesthesia Regional Block: Supraclavicular block   Pre-Anesthetic Checklist: ,, timeout performed, Correct Patient, Correct Site, Correct Laterality, Correct Procedure, Correct Position, site marked, Risks and benefits discussed,  Surgical consent,  Pre-op evaluation,  At surgeon's request and post-op pain management  Laterality: Left  Prep: chloraprep       Needles:  Injection technique: Single-shot  Needle Type: Echogenic Stimulator Needle     Needle Length: 9cm  Needle Gauge: 22     Additional Needles:   Procedures: ultrasound guided,,,,,,,,  Narrative:  Start time: 03/03/2017 11:05 AM End time: 03/03/2017 11:15 AM Injection made incrementally with aspirations every 5 mL.  Performed by: Personally  Anesthesiologist: Adele Barthel P  Additional Notes: Functioning IV was confirmed and monitors were applied.  A 90mm 22ga Arrow echogenic stimulator needle was used. Sterile prep,hand hygiene and sterile gloves were used.  Negative aspiration and negative test dose prior to incremental administration of local anesthetic. The patient tolerated the procedure well.  Ultrasound guidance: relevent anatomy identified, needle position confirmed, local anesthetic spread visualized around nerve(s), vascular puncture avoided.  Image printed for medical record.

## 2017-03-03 NOTE — Anesthesia Preprocedure Evaluation (Addendum)
Anesthesia Evaluation  Patient identified by MRN, date of birth, ID band Patient awake    Reviewed: Allergy & Precautions, NPO status , Patient's Chart, lab work & pertinent test results  Airway Mallampati: II  TM Distance: >3 FB Neck ROM: Full    Dental no notable dental hx.    Pulmonary neg pulmonary ROS,    Pulmonary exam normal breath sounds clear to auscultation       Cardiovascular Exercise Tolerance: Good negative cardio ROS Normal cardiovascular exam Rhythm:Regular Rate:Normal     Neuro/Psych Anxiety Depression negative neurological ROS     GI/Hepatic negative GI ROS, Neg liver ROS,   Endo/Other  negative endocrine ROS  Renal/GU negative Renal ROS  negative genitourinary   Musculoskeletal negative musculoskeletal ROS (+)   Abdominal   Peds negative pediatric ROS (+)  Hematology  (+) anemia ,   Anesthesia Other Findings   Reproductive/Obstetrics negative OB ROS                            Anesthesia Physical Anesthesia Plan  ASA: II  Anesthesia Plan: General and Regional   Post-op Pain Management: GA combined w/ Regional for post-op pain   Induction: Intravenous  Airway Management Planned: LMA  Additional Equipment:   Intra-op Plan:   Post-operative Plan:   Informed Consent: I have reviewed the patients History and Physical, chart, labs and discussed the procedure including the risks, benefits and alternatives for the proposed anesthesia with the patient or authorized representative who has indicated his/her understanding and acceptance.   Dental advisory given  Plan Discussed with: CRNA and Surgeon  Anesthesia Plan Comments:         Anesthesia Quick Evaluation

## 2017-03-03 NOTE — Anesthesia Postprocedure Evaluation (Signed)
Anesthesia Post Note  Patient: Shellye Zandi Edler  Procedure(s) Performed: Procedure(s) (LRB): OPEN REDUCTION INTERNAL FIXATION (ORIF) LEFT DISTAL RADIUS FRACTURE (Left)  Patient location during evaluation: PACU Anesthesia Type: Regional and General Level of consciousness: awake and alert Pain management: pain level controlled Vital Signs Assessment: post-procedure vital signs reviewed and stable Respiratory status: spontaneous breathing, nonlabored ventilation, respiratory function stable and patient connected to nasal cannula oxygen Cardiovascular status: blood pressure returned to baseline and stable Postop Assessment: no signs of nausea or vomiting Anesthetic complications: no       Last Vitals:  Vitals:   03/03/17 1406 03/03/17 1457  BP: (!) 157/85 (!) 152/84  Pulse: 71 70  Resp: 14 15  Temp: 36.9 C 36.6 C    Last Pain:  Vitals:   03/03/17 1457  TempSrc: Oral  PainSc: 5                  Ryan P Ellender

## 2017-03-03 NOTE — H&P (Signed)
Misty Bullock is an 61 y.o. female.   Chief Complaint:   Left wrist pain; known fracture HPI: The patient is a 61 year old female who unfortunately sustained a mechanical fall of her week ago. At that accident she suffered a right tibial plateau fracture and a right lateral malleolus fracture. These are both being treated nonoperative. However she has been nonweightbearing and unfortunately she sustained another mechanical fall earlier this week. She was seen again in the emergency room and found to have a displaced and angulated left distal radius fracture. I saw her in the office and recommended surgery on the left wrist due to the displaced nature of this. I showed her wrist model and went over her x-rays and described in detail what surgery involves and a recommendation for this. She agrees to this. We will be admitting her after surgery as an inpatient due to mobility issues and the fact that she may need skilled nursing placement. She again understands fully why we are recommending surgery on her left wrist today.  History reviewed. No pertinent past medical history.  Past Surgical History:  Procedure Laterality Date  . bilateral blerphoplasty     . cataract surgery     . WISDOM TOOTH EXTRACTION      History reviewed. No pertinent family history. Social History:  reports that she has never smoked. She has never used smokeless tobacco. She reports that she drinks alcohol. She reports that she does not use drugs.  Allergies:  Allergies  Allergen Reactions  . Latex Swelling    Swelling of lips during dental procedure    Medications Prior to Admission  Medication Sig Dispense Refill  . ALPRAZolam (XANAX) 0.5 MG tablet Take 0.5 mg by mouth daily.  5  . FLUoxetine (PROZAC) 20 MG capsule Take 20 mg by mouth daily.    Marland Kitchen gabapentin (NEURONTIN) 100 MG capsule Take 100-300 mg by mouth before bed as needed for pain  1  . HYDROcodone-acetaminophen (NORCO/VICODIN) 5-325 MG tablet Take 1 tablet by  mouth every 4 (four) hours as needed for moderate pain.    . meloxicam (MOBIC) 7.5 MG tablet Take 2 tablets (15 mg total) by mouth daily. 30 tablet 0  . zolpidem (AMBIEN) 10 MG tablet Take 10 mg by mouth at bedtime as needed for sleep.       Results for orders placed or performed during the hospital encounter of 03/02/17 (from the past 48 hour(s))  CBC     Status: Abnormal   Collection Time: 03/02/17  1:29 PM  Result Value Ref Range   WBC 5.7 4.0 - 10.5 K/uL   RBC 3.46 (L) 3.87 - 5.11 MIL/uL   Hemoglobin 10.6 (L) 12.0 - 15.0 g/dL   HCT 33.2 (L) 36.0 - 46.0 %   MCV 96.0 78.0 - 100.0 fL   MCH 30.6 26.0 - 34.0 pg   MCHC 31.9 30.0 - 36.0 g/dL   RDW 12.5 11.5 - 15.5 %   Platelets 336 150 - 400 K/uL   Xr Ankle 2 Views Right  Result Date: 03/01/2017 2 views of the right ankle show a nondisplaced lateral malleolus fracture that is stable. The ankle mortise is well located. The fracture is nondisplaced.  Xr Knee 1-2 Views Right  Result Date: 03/01/2017 2 views of the right knee show a minimally displaced lateral tibial plateau fracture that has not changed over previous x-rays. It still appears stable.   ROS  Blood pressure 110/68, pulse 71, temperature 100 F (37.8 C), temperature  source Oral, resp. rate 16, height 5\' 6"  (1.676 m), weight 124 lb (56.2 kg), SpO2 100 %. Physical Exam  Constitutional: She is oriented to person, place, and time. She appears well-developed and well-nourished.  HENT:  Head: Normocephalic and atraumatic.  Eyes: EOM are normal. Pupils are equal, round, and reactive to light.  Neck: Normal range of motion. Neck supple.  Cardiovascular: Normal rate and regular rhythm.   Respiratory: Effort normal and breath sounds normal.  GI: Soft. Bowel sounds are normal.  Musculoskeletal:       Left wrist: She exhibits decreased range of motion, tenderness, bony tenderness, swelling and deformity.  Neurological: She is alert and oriented to person, place, and time.   Skin: Skin is warm and dry.  Psychiatric: She has a normal mood and affect.     Assessment/Plan Left distal radius fracture with displacement and angulation 1)  To the OR today for surgical fixation of her left distal radius followed by admission as an inpatient due to mobility issues.  Risks and benefits have been discussed.  Mcarthur Rossetti, MD 03/03/2017, 9:38 AM

## 2017-03-04 DIAGNOSIS — S52532A Colles' fracture of left radius, initial encounter for closed fracture: Secondary | ICD-10-CM | POA: Diagnosis not present

## 2017-03-04 MED ORDER — OXYCODONE-ACETAMINOPHEN 5-325 MG PO TABS: 1.0000 | ORAL_TABLET | ORAL | 0 refills | Status: DC | PRN

## 2017-03-04 NOTE — Progress Notes (Signed)
Contacted [facility] DME rep for Left Platform RW for home. Jonnie Finner RN CCM Case Mgmt phone [phone]

## 2017-03-04 NOTE — Progress Notes (Signed)
Subjective: 1 Day Post-Op Procedure(s) (LRB): OPEN REDUCTION INTERNAL FIXATION (ORIF) LEFT DISTAL RADIUS FRACTURE (Left) Patient reports pain as moderate.  Has been up with a platform walker.  Objective: Vital signs in last 24 hours: Temp:  [97.9 F (36.6 C)-98.7 F (37.1 C)] 98.6 F (37 C) (05/26 1038) Pulse Rate:  [61-71] 71 (05/26 1038) Resp:  [12-20] 17 (05/26 1038) BP: (100-166)/(65-85) 100/65 (05/26 1038) SpO2:  [97 %-100 %] 100 % (05/26 1038)  Intake/Output from previous day: 05/25 0701 - 05/26 0700 In: 2433.8 [I.V.:2323.8; IV Piggyback:110] Out: 1105 [Urine:1100; Blood:5] Intake/Output this shift: Total I/O In: 480 [P.O.:480] Out: 400 [Urine:400]   Recent Labs  03/02/17 1329  HGB 10.6*    Recent Labs  03/02/17 1329  WBC 5.7  RBC 3.46*  HCT 33.2*  PLT 336   No results for input(s): NA, K, CL, CO2, BUN, CREATININE, GLUCOSE, CALCIUM in the last 72 hours. No results for input(s): LABPT, INR in the last 72 hours.  Sensation intact distally Intact pulses distally Incision: no drainage No cellulitis present  Assessment/Plan: 1 Day Post-Op Procedure(s) (LRB): OPEN REDUCTION INTERNAL FIXATION (ORIF) LEFT DISTAL RADIUS FRACTURE (Left) Up with therapy Plan for discharge tomorrow Discharge home with home health  Mcarthur Rossetti 03/04/2017, 12:19 PM

## 2017-03-04 NOTE — Discharge Instructions (Signed)
Increase your activities slowly. No heavy lifting with your left wrist. You can move your left wrist and fingers as comfort allows. Ice and elevation as needed for swelling. Still no weight on your right leg. You do not have the wear either brace while in bed.

## 2017-03-04 NOTE — Evaluation (Signed)
Physical Therapy Evaluation Patient Details Name: Misty Bullock MRN: [id number] DOB: 03-Jul-1956 Today's Date: 03/04/2017   History of Present Illness  61 y.o. female with R tibial plateau  and lateral malleolus fx 02/22/17 admitted with fall and L wrist fx. Pt is NWB RLE and LUE. Pateitn DC'd  to home and fell and sustained displaced fracture Left distal radius, S/P Orif 03/03/17 of radius. Has a  brace for right knee  Clinical Impression  The patient was instructed in use of PFRW and did well. She  Will need platform, has a RW. Pt admitted with above diagnosis. Pt currently with functional limitations due to the deficits listed below (see PT Problem List). Pt will benefit from skilled PT to increase their independence and safety with mobility to allow discharge to the venue listed below.       Follow Up Recommendations Home health PT;Supervision - Intermittent    Equipment Recommendations   (Platform for RW)    Recommendations for Other Services       Precautions / Restrictions Precautions Precautions: Fall Required Braces or Orthoses: Other Brace/Splint (Right Bledsoe type) Knee Immobilizer - Right: On at all times Restrictions Weight Bearing Restrictions: Yes LUE Weight Bearing: Non weight bearing RLE Weight Bearing: Non weight bearing      Mobility  Bed Mobility Overal bed mobility: Modified Independent                Transfers Overall transfer level: Needs assistance Equipment used: Left platform walker Transfers: Sit to/from Stand Sit to Stand: Min guard         General transfer comment: cues for technique and use of platform  Ambulation/Gait Ambulation/Gait assistance: Min guard Ambulation Distance (Feet): 20 Feet Assistive device: Left platform walker Gait Pattern/deviations: Step-to pattern     General Gait Details: maintained NWB right leg, practiced side steps as if going into bathroom with narrow door  Stairs            Wheelchair Mobility    Modified Rankin (Stroke Patients Only)       Balance Overall balance assessment: Needs assistance;History of Falls Sitting-balance support: Feet supported;No upper extremity supported Sitting balance-Leahy Scale: Good     Standing balance support: During functional activity Standing balance-Leahy Scale: Fair Standing balance comment: with platform                             Pertinent Vitals/Pain Pain Score: 3  Pain Location: L wrist Pain Descriptors / Indicators: Aching Pain Intervention(s): Monitored during session;Premedicated before session;Ice applied    Home Living Family/patient expects to be discharged to:: Private residence Living Arrangements: Alone     Home Access: Stairs to enter   [employer] of Steps: 3- states she was bumped up steps in [place]: One level Home Equipment: Walker - 2 wheels;Crutches;Wheelchair - manual;Bedside commode      Prior Function Level of Independence: Needs assistance   Gait / Transfers Assistance Needed: used WC with leg rests, was using crutches for getting into BR, used WC mostly  ADL's / Homemaking Assistance Needed: used 3 in 1 in shower with assist from her mom , was independent prior to that        Hand Dominance        Extremity/Trunk Assessment   Upper Extremity Assessment Upper Extremity Assessment: Defer to OT evaluation    Lower Extremity Assessment Lower Extremity Assessment: RLE deficits/detail RLE Deficits / Details: has  brace that allows knee flexion to about 40degrees.    Cervical / Trunk Assessment Cervical / Trunk Assessment: Normal  Communication      Cognition Arousal/Alertness: Awake/alert Behavior During Therapy: WFL for tasks assessed/performed Overall Cognitive Status: Within Functional Limits for tasks assessed                                        General Comments      Exercises     Assessment/Plan    PT Assessment Patient  needs continued PT services  PT Problem List Decreased range of motion;Decreased strength;Decreased activity tolerance;Decreased balance;Pain;Decreased mobility;Decreased knowledge of use of DME       PT Treatment Interventions DME instruction;Gait training;Stair training;Functional mobility training;Balance training;Therapeutic exercise;Therapeutic activities;Patient/family education    PT Goals (Current goals can be found in the Care Plan section)  Acute Rehab PT Goals Patient Stated Goal: to not fall PT Goal Formulation: With patient Time For Goal Achievement: 03/11/17 Potential to Achieve Goals: Good    Frequency Min 5X/week   Barriers to discharge        Co-evaluation               AM-PAC PT "6 Clicks" Daily Activity  Outcome Measure Difficulty turning over in bed (including adjusting bedclothes, sheets and blankets)?: None Difficulty moving from lying on back to sitting on the side of the bed? : None Difficulty sitting down on and standing up from a chair with arms (e.g., wheelchair, bedside commode, etc,.)?: A Little Help needed moving to and from a bed to chair (including a wheelchair)?: A Little Help needed walking in hospital room?: A Little Help needed climbing 3-5 steps with a railing? : A Lot 6 Click Score: 19    End of Session   Activity Tolerance: Patient tolerated treatment well Patient left: in bed;with call bell/phone within reach Nurse Communication: Mobility status PT Visit Diagnosis: Unsteadiness on feet (R26.81);History of falling (Z91.81);Muscle weakness (generalized) (M62.81)    Time: 6599-3570 PT Time Calculation (min) (ACUTE ONLY): 17 min   Charges:   PT Evaluation $PT Eval Low Complexity: 1 Procedure     PT G CodesTresa Endo PT [phone]   Claretha Cooper 03/04/2017, 11:12 AM

## 2017-03-04 NOTE — Progress Notes (Signed)
Occupational Therapy Evaluation Patient Details Name: Misty Bullock MRN: [id number] DOB: 1955-12-11 Today's Date: 03/04/2017    History of Present Illness 61 y.o. female with R tibial plateau  and lateral malleolus fx 02/22/17 admitted with fall and L wrist fx. Pt is NWB RLE and LUE. Pateitn DC'd  to home and fell and sustained displaced fracture Left distal radius, S/P Orif 03/03/17 of radius. Has a  brace for right knee   Clinical Impression   Patient presents to OT with decreased ADL independence and safety due to the deficits listed below. She will benefit from skilled OT to maximize function and to facilitate a safe discharge. OT will follow.    Follow Up Recommendations  Home health OT;Supervision/Assistance - 24 hour    Equipment Recommendations  None recommended by OT    Recommendations for Other Services       Precautions / Restrictions Precautions Precautions: Fall Required Braces or Orthoses: Other Brace/Splint Knee Immobilizer - Right: On at all times Restrictions Weight Bearing Restrictions: Yes LUE Weight Bearing: Non weight bearing RLE Weight Bearing: Non weight bearing      Mobility Bed Mobility Overal bed mobility: Modified Independent                Transfers             General transfer comment: recently got up with PT; preferred to practice [facility] transfers next session    Balance                                     ADL either performed or assessed with clinical judgement   ADL Overall ADL's : Needs assistance/impaired Eating/Feeding: Set up;Sitting;Bed level   Grooming: Set up;Sitting;Bed level   Upper Body Bathing: Minimal assistance;Bed level   Lower Body Bathing: Moderate assistance;Bed level         Lower Body Dressing Details (indicate cue type and reason): able to reach down to feet while in bed   Toilet Transfer Details (indicate cue type and reason): has been using bedpan with nursing; does not wish to  attempt [facility] transfer this session       Tub/Shower Transfer Details (indicate cue type and reason): educated on sponge bathing for now until cleared by MD         Vision         Perception     Praxis      Pertinent Vitals/Pain Pain Assessment: 0-10 Pain Score: 3  Pain Location: L wrist Pain Descriptors / Indicators: Throbbing;Aching Pain Intervention(s): Limited activity within patient's tolerance;Monitored during session;Ice applied     Hand Dominance Right   Extremity/Trunk Assessment Upper Extremity Assessment Upper Extremity Assessment: LUE deficits/detail LUE Deficits / Details: wrist immobilized in ace wrap/splint. Fingers bruised with edema but able to perform some AROM digits/thumb, encouraged AROM shoulder and elbow LUE: Unable to fully assess due to immobilization   Lower Extremity Assessment Lower Extremity Assessment: Defer to PT evaluation RLE Deficits / Details: has brace that allows knee flexion to about 40degrees.   Cervical / Trunk Assessment Cervical / Trunk Assessment: Normal   Communication Communication Communication: No difficulties   Cognition Arousal/Alertness: Awake/alert Behavior During Therapy: WFL for tasks assessed/performed Overall Cognitive Status: Within Functional Limits for tasks assessed  General Comments  edema and bruising RLE and L hand    Exercises     Shoulder Instructions      Home Living Family/patient expects to be discharged to:: Private residence Living Arrangements: Alone Available Help at Discharge: Personal care attendant;Friend(s);Family;Available PRN/intermittently Type of Home: House Home Access: Stairs to enter [employer] of Steps: 3- states she was bumped up steps in [place]: One level     Bathroom Shower/Tub: Walk-in shower;Door   [employer] Toilet: Standard Bathroom Accessibility: No   Home Equipment: Environmental consultant - 2  wheels;Crutches;Wheelchair - manual;Bedside commode          Prior Functioning/Environment Level of Independence: Needs assistance  Gait / Transfers Assistance Needed: used WC with leg rests, was using crutches for getting into BR, used WC mostly ADL's / Homemaking Assistance Needed: used 3 in 1 in shower with assist from her mom , was independent prior to that            OT Problem List: Decreased strength;Decreased activity tolerance;Decreased knowledge of use of DME or AE;Impaired UE functional use;Pain      OT Treatment/Interventions: Self-care/ADL training;Therapeutic exercise;DME and/or AE instruction;Therapeutic activities;Patient/family education    OT Goals(Current goals can be found in the care plan section) Acute Rehab OT Goals Patient Stated Goal: to not fall OT Goal Formulation: With patient Time For Goal Achievement: 03/11/17 Potential to Achieve Goals: Good ADL Goals Pt Will Transfer to Toilet: with supervision;bedside commode;stand pivot transfer Pt Will Perform Toileting - Clothing Manipulation and hygiene: with supervision;sitting/lateral leans;sit to/from stand Additional ADL Goal #1: Patient will be independent with AROM digits/thumb, elbow, shoulder LUE and with elevation and edema control techniques  OT Frequency: Min 2X/week   Barriers to D/C: Decreased caregiver support          Co-evaluation              AM-PAC PT "6 Clicks" Daily Activity     Outcome Measure Help from another person eating meals?: None Help from another person taking care of personal grooming?: A Little Help from another person toileting, which includes using toliet, bedpan, or urinal?: A Lot Help from another person bathing (including washing, rinsing, drying)?: A Little Help from another person to put on and taking off regular upper body clothing?: A Little Help from another person to put on and taking off regular lower body clothing?: A Little 6 Click Score: 18   End  of Session    Activity Tolerance: Patient tolerated treatment well Patient left: in bed;with call bell/phone within reach  OT Visit Diagnosis: Muscle weakness (generalized) (M62.81);Repeated falls (R29.6)                Time: 2683-4196 OT Time Calculation (min): 33 min Charges:  OT General Charges $OT Visit: 1 Procedure OT Evaluation $OT Eval Low Complexity: 1 Procedure OT Treatments $Self Care/Home Management : 8-22 mins G-Codes:       Celisa Schoenberg A Francois Elk Mar 06, 2017, 11:48 AM

## 2017-03-05 DIAGNOSIS — S52532A Colles' fracture of left radius, initial encounter for closed fracture: Secondary | ICD-10-CM | POA: Diagnosis not present

## 2017-03-05 NOTE — Progress Notes (Signed)
Occupational Therapy Treatment Patient Details Name: Misty Bullock MRN: [id number] DOB: 10/14/1955 Today's Date: 03/05/2017    History of present illness 61 y.o. female with R tibial plateau  and lateral malleolus fx 02/22/17 admitted with fall and L wrist fx. Pt is NWB RLE and LUE. Pateitn DC'd  to home and fell and sustained displaced fracture Left distal radius, S/P Orif 03/03/17 of radius. Has a  brace for right knee      Follow Up Recommendations  Home health OT;Supervision/Assistance - 24 hour    Equipment Recommendations  None recommended by OT    Recommendations for Other Services      Precautions / Restrictions Precautions Precautions: Fall Required Braces or Orthoses: Other Brace/Splint Knee Immobilizer - Right: On at all times Restrictions Weight Bearing Restrictions: Yes LUE Weight Bearing: Non weight bearing RLE Weight Bearing: Non weight bearing       Mobility Bed Mobility Overal bed mobility: Modified Independent;Needs Assistance Bed Mobility: Supine to Sit     Supine to sit: Supervision        Transfers Overall transfer level: Needs assistance Equipment used: Left platform walker Transfers: Sit to/from Stand;Stand Pivot Transfers Sit to Stand: Min guard Stand pivot transfers: Min guard       General transfer comment: VC for sequencing    Balance Overall balance assessment: Needs assistance;History of Falls Sitting-balance support: Feet supported;No upper extremity supported Sitting balance-Leahy Scale: Good     Standing balance support: During functional activity Standing balance-Leahy Scale: Fair Standing balance comment: with platform                           ADL either performed or assessed with clinical judgement   ADL Overall ADL's : Needs assistance/impaired     Grooming: Set up;Sitting       Lower Body Bathing: Minimal assistance;Cueing for safety;Cueing for sequencing;Cueing for compensatory techniques;With adaptive  equipment Lower Body Bathing Details (indicate cue type and reason): with reacher         Toilet Transfer: Minimal assistance;RW;BSC;Stand-pivot Toilet Transfer Details (indicate cue type and reason): with platform Toileting- Clothing Manipulation and Hygiene: Sit to/from stand;Cueing for safety;Cueing for sequencing;Cueing for compensatory techniques;Minimal assistance         General ADL Comments: pt will have A at home for IADL activity                Cognition Arousal/Alertness: Awake/alert Behavior During Therapy: WFL for tasks assessed/performed Overall Cognitive Status: Within Functional Limits for tasks assessed                                                     Pertinent Vitals/ Pain       Pain Score: 2  Pain Location: L wrist Pain Descriptors / Indicators: Discomfort Pain Intervention(s): Monitored during session;Ice applied     Prior Functioning/Environment              Frequency  Min 2X/week        Progress Toward Goals  OT Goals(current goals can now be found in the care plan section)  Progress towards OT goals: Progressing toward goals     Plan Discharge plan remains appropriate    Co-evaluation  AM-PAC PT "6 Clicks" Daily Activity     Outcome Measure   Help from another person eating meals?: None Help from another person taking care of personal grooming?: A Little Help from another person toileting, which includes using toliet, bedpan, or urinal?: A Lot Help from another person bathing (including washing, rinsing, drying)?: A Little Help from another person to put on and taking off regular upper body clothing?: A Little Help from another person to put on and taking off regular lower body clothing?: A Little 6 Click Score: 18    End of Session Equipment Utilized During Treatment: Rolling walker  OT Visit Diagnosis: Muscle weakness (generalized) (M62.81);Repeated falls (R29.6)   Activity  Tolerance Patient tolerated treatment well   Patient Left in bed;with call bell/phone within reach   Nurse Communication Mobility status        Time: 0940-7680 OT Time Calculation (min): 30 min  Charges: OT General Charges $OT Visit: 1 Procedure OT Treatments $Self Care/Home Management : 23-37 mins  [place], [place]   Payton Mccallum D 03/05/2017, 11:52 AM

## 2017-03-05 NOTE — Discharge Summary (Signed)
Discharge Diagnoses:  Principal Problem:   Fracture, [place], left, closed Active Problems:   Closed fracture of left distal radius   Surgeries: Procedure(s): OPEN REDUCTION INTERNAL FIXATION (ORIF) LEFT DISTAL RADIUS FRACTURE on 03/03/2017    Consultants:   Discharged Condition: Improved  Hospital Course: Misty Bullock is an 61 y.o. female who was admitted 03/03/2017 with a chief complaint of left distal radius fracture, with a final diagnosis of displaced left distal radius fracture.  Patient was brought to the operating room on 03/03/2017 and underwent Procedure(s): OPEN REDUCTION INTERNAL FIXATION (ORIF) LEFT DISTAL RADIUS FRACTURE.    Patient was given perioperative antibiotics: Anti-infectives    Start     Dose/Rate Route Frequency Ordered Stop   03/03/17 1800  ceFAZolin (ANCEF) IVPB 1 g/50 mL premix     1 g 100 mL/hr over 30 Minutes Intravenous Every 6 hours 03/03/17 1408 03/04/17 0623   03/03/17 0913  ceFAZolin (ANCEF) IVPB 2g/100 mL premix     2 g 200 mL/hr over 30 Minutes Intravenous On call to O.R. 03/03/17 3845 03/03/17 1209    .  Patient was given sequential compression devices, early ambulation, and aspirin for DVT prophylaxis.  Recent vital signs: Patient Vitals for the past 24 hrs:  BP Temp Temp src Pulse Resp SpO2  03/05/17 0457 (!) 151/75 99.3 F (37.4 C) Oral 68 20 99 %  03/04/17 2150 110/60 98.7 F (37.1 C) Oral 79 20 98 %  03/04/17 1402 129/72 98.8 F (37.1 C) Oral 64 18 98 %  03/04/17 1038 100/65 98.6 F (37 C) Oral 71 17 100 %  .  Recent laboratory studies: No results found.  Discharge Medications:   Allergies as of 03/05/2017      Reactions   Latex Swelling   Swelling of lips during dental procedure      Medication List    STOP taking these medications   HYDROcodone-acetaminophen 5-325 MG tablet Commonly known as:  NORCO/VICODIN     TAKE these medications   ALPRAZolam 0.5 MG tablet Commonly known as:  XANAX Take 0.5 mg by mouth daily.    FLUoxetine 20 MG capsule Commonly known as:  PROZAC Take 20 mg by mouth daily.   gabapentin 100 MG capsule Commonly known as:  NEURONTIN Take 100-300 mg by mouth before bed as needed for pain   meloxicam 7.5 MG tablet Commonly known as:  MOBIC Take 2 tablets (15 mg total) by mouth daily.   oxyCODONE-acetaminophen 5-325 MG tablet Commonly known as:  ROXICET Take 1-2 tablets by mouth every 4 (four) hours as needed.   zolpidem 10 MG tablet Commonly known as:  AMBIEN Take 10 mg by mouth at bedtime as needed for sleep.            Durable Medical Equipment        Start     Ordered   03/04/17 1157  For home use only DME Walker platform  Once    Comments:  left  Question Answer Comment  Patient needs a walker to treat with the following condition Left wrist fracture   Patient needs a walker to treat with the following condition Fracture of right tibial plateau      03/04/17 1157      Diagnostic Studies: Dg Wrist Complete Left  Result Date: 02/27/2017 CLINICAL DATA:  Status post fall, with left wrist deformity. Initial encounter. EXAM: LEFT WRIST - COMPLETE 3+ VIEW COMPARISON:  None. FINDINGS: There is a comminuted fracture involving the distal radial  metadiaphysis, with mild impaction, and mild dorsal angulation. An ulnar styloid fracture is also noted. The carpal rows are intact, and demonstrate normal alignment. Surrounding soft tissue swelling is noted at the wrist. IMPRESSION: Comminuted fracture of the distal radial metadiaphysis, with mild impaction, and mild dorsal angulation. Ulnar styloid fracture also noted. Electronically Signed   By: Garald Balding M.D.   On: 02/27/2017 03:42   Dg Tibia/fibula Right  Result Date: 02/22/2017 CLINICAL DATA:  Tripped over dog, fall onto hardwood floor. Right knee, lower leg and ankle pain. Swelling. EXAM: RIGHT TIBIA AND FIBULA - 2 VIEW COMPARISON:  No prior.  Concurrent knee and ankle radiographs. FINDINGS: Knee joint and ankle  fractures better characterized on dedicated joint exams. Tibial and fibular shafts are intact. Soft tissue edema about the lateral malleolus. Soft tissue edema and joint effusion about the knee. IMPRESSION: Knee joint and lateral malleolar fractures better characterized on concurrent dedicated joint exams. Diffuse bony under mineralization. Electronically Signed   By: Jeb Levering M.D.   On: 02/22/2017 03:59   Dg Ankle Complete Right  Result Date: 02/22/2017 CLINICAL DATA:  Trip over dog onto hardwood floor, right ankle pain. EXAM: RIGHT ANKLE - COMPLETE 3+ VIEW COMPARISON:  None. FINDINGS: Nondisplaced oblique fracture of the distal fibula. This is at the level of the ankle mortise. Ankle mortise is preserved. No additional acute fracture. The bones are diffusely under mineralized. There is lateral soft tissue edema. No definite tibial talar joint effusion. IMPRESSION: Nondisplaced oblique fracture of the distal fibula at the level of the ankle mortise. Electronically Signed   By: Jeb Levering M.D.   On: 02/22/2017 04:01   Ct Tibia Fibula Right Wo Contrast  Result Date: 02/22/2017 CLINICAL DATA:  Tibial plateau on lateral malleolar fractures. EXAM: CT OF THE LOWER RIGHT EXTREMITY WITHOUT CONTRAST TECHNIQUE: Multidetector CT imaging of the right lower extremity was performed according to the standard protocol. COMPARISON:  None. FINDINGS: Bones/Joint/Cartilage Comminuted fracture lateral tibial plateau with central depression of 5 mm. Fracture extends to the lateral aspect of the tibial spine. No medial tibial plateau component. There may be minimal extension to the metaphysis, bony under mineralization limits assessment. No definite vertically-oriented component. The lipohemarthrosis on radiographs is not included in the field of view. Fracture of the lateral malleolus/fibular tip is minimally displaced distally, minimally comminuted. Small fracture fragments extends into the lateral ankle mortise.  No fracture of the medial malleolus or posterior tibial tubercle. Diffuse osteopenia/osteoporosis. Ligaments Suboptimally assessed by CT. Muscles and Tendons No large intramuscular hematoma.  Achilles tendon is intact. Soft tissues Soft tissue edema adjacent to the lateral malleolus. Soft tissue edema about the lateral knee. IMPRESSION: 1. Lateral tibial plateau fracture with lateral depression. Possible glass of fracture, this is a Schatzker type II or IIIa. 2. Mildly comminuted fracture of the distal fibular tip and lateral malleolus. Minimal displacement, with small fracture fragment extending into the lateral ankle mortise. Electronically Signed   By: Jeb Levering M.D.   On: 02/22/2017 05:34   Dg Knee Complete 4 Views Right  Result Date: 02/22/2017 CLINICAL DATA:  Tripped over dog onto hardwood floor. Left knee pain. EXAM: RIGHT KNEE - COMPLETE 4+ VIEW COMPARISON:  None. FINDINGS: Mildly comminuted lateral tibial plateau fracture. Mild central depression. No definite involvement of the medial tibial plateau. Proximal fibula is intact. Moderate joint effusion. The bones are diffusely under mineralized. IMPRESSION: Comminuted lateral tibial plateau fracture with central depression. Electronically Signed   By: Fonnie Birkenhead.D.  On: 02/22/2017 04:00   Xr Ankle 2 Views Right  Result Date: 03/01/2017 2 views of the right ankle show a nondisplaced lateral malleolus fracture that is stable. The ankle mortise is well located. The fracture is nondisplaced.  Xr Knee 1-2 Views Right  Result Date: 03/01/2017 2 views of the right knee show a minimally displaced lateral tibial plateau fracture that has not changed over previous x-rays. It still appears stable.   Patient benefited maximally from their hospital stay and there were no complications.     Disposition: 01-Home or Self Care Discharge Instructions    Call MD / Call 911    Complete by:  As directed    If you experience chest pain or  shortness of breath, CALL 911 and be transported to the hospital emergency room.  If you develope a fever above 101 F, pus (white drainage) or increased drainage or redness at the wound, or calf pain, call your surgeon's office.   Constipation Prevention    Complete by:  As directed    Drink plenty of fluids.  Prune juice may be helpful.  You may use a stool softener, such as Colace (over the counter) 100 mg twice a day.  Use MiraLax (over the counter) for constipation as needed.   Diet - low sodium heart healthy    Complete by:  As directed    Increase activity slowly as tolerated    Complete by:  As directed      Follow-up Information    Mcarthur Rossetti, MD Follow up in 2 week(s).   Specialty:  Orthopedic Surgery Contact information: [address] [place] [address] [phone]            Signed: Newt Minion 03/05/2017, 6:24 AM

## 2017-03-05 NOTE — Progress Notes (Signed)
Physical Therapy Treatment Patient Details Name: Misty Bullock MRN: [id number] DOB: 29-Aug-1956 Today's Date: 03/05/2017    History of Present Illness 61 y.o. female with R tibial plateau  and lateral malleolus fx 02/22/17 admitted with fall and L wrist fx. Pt is NWB RLE and LUE. Pateitn DC'd  to home and fell and sustained displaced fracture Left distal radius, S/P Orif 03/03/17 of radius. Has a  brace for right knee    PT Comments    Plans to DC today.   Follow Up Recommendations  Home health PT;Supervision - Intermittent     Equipment Recommendations       Recommendations for Other Services       Precautions / Restrictions Precautions Precautions: Fall Required Braces or Orthoses: Other Brace/Splint Knee Immobilizer - Right: On when out of bed or walking Restrictions Weight Bearing Restrictions: Yes LUE Weight Bearing: Non weight bearing RLE Weight Bearing: Non weight bearing    Mobility  Bed Mobility Overal bed mobility: Modified Independent;Needs Assistance Bed Mobility: Supine to Sit     Supine to sit: Supervision     General bed mobility comments: in recliner  Transfers Overall transfer level: Needs assistance Equipment used: Left platform walker Transfers: Sit to/from Stand;Stand Pivot Transfers Sit to Stand: Min guard Stand pivot transfers: Min guard       General transfer comment: VC for sequencing  Ambulation/Gait Ambulation/Gait assistance: Min guard Ambulation Distance (Feet): 20 Feet (x 2) Assistive device: Left platform walker Gait Pattern/deviations: Step-to pattern     General Gait Details: maintained NWB right leg,, practiced sidestepping into BR and out.   Stairs            Wheelchair Mobility    Modified Rankin (Stroke Patients Only)       Balance Overall balance assessment: Needs assistance;History of Falls Sitting-balance support: Feet supported;No upper extremity supported Sitting balance-Leahy Scale: Good      Standing balance support: During functional activity Standing balance-Leahy Scale: Fair Standing balance comment: with platform                            Cognition Arousal/Alertness: Awake/alert Behavior During Therapy: WFL for tasks assessed/performed Overall Cognitive Status: Within Functional Limits for tasks assessed                                        Exercises General Exercises - Lower Extremity Ankle Circles/Pumps: AROM;10 reps;Right Quad Sets: AROM;10 reps;Right Straight Leg Raises: AROM;5 reps    General Comments        Pertinent Vitals/Pain Pain Score: 0-No pain Pain Location: L wrist Pain Descriptors / Indicators: Discomfort Pain Intervention(s): Monitored during session;Ice applied    Home Living                      Prior Function            PT Goals (current goals can now be found in the care plan section) Progress towards PT goals: Progressing toward goals    Frequency    Min 5X/week      PT Plan Current plan remains appropriate    Co-evaluation              AM-PAC PT "6 Clicks" Daily Activity  Outcome Measure  Difficulty turning over in bed (including adjusting bedclothes, sheets and blankets)?: None Difficulty  moving from lying on back to sitting on the side of the bed? : None Difficulty sitting down on and standing up from a chair with arms (e.g., wheelchair, bedside commode, etc,.)?: A Little Help needed moving to and from a bed to chair (including a wheelchair)?: A Little Help needed walking in hospital room?: A Little Help needed climbing 3-5 steps with a railing? : A Lot 6 Click Score: 19    End of Session   Activity Tolerance: Patient tolerated treatment well Patient left: in bed;with call bell/phone within reach Nurse Communication: Mobility status PT Visit Diagnosis: Unsteadiness on feet (R26.81);History of falling (Z91.81);Muscle weakness (generalized) (M62.81)     Time:  3536-1443 PT Time Calculation (min) (ACUTE ONLY): 28 min  Charges:  $Gait Training: 8-22 mins $Therapeutic Exercise: 8-22 mins                    G CodesTresa Endo PT [phone]    Claretha Cooper 03/05/2017, 1:16 PM

## 2017-03-05 NOTE — Progress Notes (Signed)
Pt was discharged home today. Instructions were reviewed with patient, and questions were answered. Pt was taken to main entrance via wheelchair by NT.  

## 2017-03-05 NOTE — Care Management Note (Signed)
Case Management Note  Patient Details  Name: Misty Bullock MRN: [id number] Date of Birth: 03-Oct-1956  Subjective/Objective:   ORIF of left distal radius fracture                 Action/Plan: Discharge Planning: NCM spoke to pt and she lives at home alone. States she was paying out of pocket for caregiver with [facility]. Offered choice for HH/list provided. Pt agreeable to [facility] for [facility] Pt has wheelchair, RW, crutches and bedside commode at home. Contacted [facility] DME rep for platform RW for home. RW in room. Pt states she will have support from mother, and friends when she goes home.    PCP Arvella Nigh MD  Expected Discharge Date:  03/05/17               Expected Discharge Plan:  [place]  In-House Referral:  NA  Discharge planning Services  CM Consult  Post Acute Care Choice:  Home Health Choice offered to:  Patient  DME Arranged:  [facility] Rile platform DME Agency:  [place] Arranged:  PT, OT [place] Agency:  [facility] ([facility]  Status of Service:  Completed, signed off  If discussed at [employer] of Stay Meetings, dates discussed:    Additional Comments:  Erenest Rasher, RN 03/05/2017, 10:35 AM

## 2017-03-08 ENCOUNTER — Encounter (HOSPITAL_COMMUNITY): Payer: Self-pay | Admitting: Orthopaedic Surgery

## 2017-03-10 ENCOUNTER — Telehealth (INDEPENDENT_AMBULATORY_CARE_PROVIDER_SITE_OTHER): Payer: Self-pay | Admitting: Orthopaedic Surgery

## 2017-03-10 NOTE — Telephone Encounter (Signed)
Misty Bullock from [facility] called asking for verbal orders of 1 week 1, 3 week 2, and 2 week 3. CB # [phone]

## 2017-03-10 NOTE — Telephone Encounter (Signed)
Verbal order given  

## 2017-03-16 ENCOUNTER — Ambulatory Visit (INDEPENDENT_AMBULATORY_CARE_PROVIDER_SITE_OTHER): Payer: Self-pay

## 2017-03-16 ENCOUNTER — Ambulatory Visit (INDEPENDENT_AMBULATORY_CARE_PROVIDER_SITE_OTHER): Payer: BC Managed Care – PPO | Admitting: Physician Assistant

## 2017-03-16 ENCOUNTER — Ambulatory Visit (INDEPENDENT_AMBULATORY_CARE_PROVIDER_SITE_OTHER): Payer: BC Managed Care – PPO

## 2017-03-16 DIAGNOSIS — M25532 Pain in left wrist: Secondary | ICD-10-CM

## 2017-03-16 DIAGNOSIS — M25561 Pain in right knee: Secondary | ICD-10-CM

## 2017-03-16 DIAGNOSIS — S52502D Unspecified fracture of the lower end of left radius, subsequent encounter for closed fracture with routine healing: Secondary | ICD-10-CM

## 2017-03-16 DIAGNOSIS — M25562 Pain in left knee: Secondary | ICD-10-CM

## 2017-03-16 DIAGNOSIS — S82141D Displaced bicondylar fracture of right tibia, subsequent encounter for closed fracture with routine healing: Secondary | ICD-10-CM

## 2017-03-16 NOTE — Progress Notes (Signed)
Misty Bullock returns today status post open reduction internal fixation left distal radius fracture and also follow-up of a right knee lateral tibial plateau fracture. She's overall doing well. 17 no real pain with the wrist. She's been nonweightbearing on the right leg is in a hinged brace 0-90 of flexion. Denies any chest pain shortness breath fevers chills  Physical exam: Left wrist surgical incisions healing well no signs of infection. Radial pulses 2+. No rashes skin lesions ulcerations. No obvious deformity of the wrist.  Right knee: No ecchymosis erythema or effusion. She has full extension and flexion and 90. Right calf supple nontender.  Radiographs: Right knee lateral plateau fracture remains unchanged in position alignment. Some early chronic activity. No other bony abnormalities or fractures.  Left wrist status post open reduction internal fixation. Radius out to full length. Fracture site is barely evident. Very early sclerotic activity noted. No other fractures identified. No hardware failure  Plan: She'll treat the little Velcro wrist splint as a cast except for taking it off for bathing and hygiene. No heavy lifting with the left arm. Scar tissue massage encouraged with a tri-washcloth. Regards to her right leg she is touchdown weightbearing 0-90 in the hinged knee brace. We'll see her back in 2 weeks obtain AP and lateral views of the right knee at that time. Questions were encouraged and answered length.

## 2017-03-17 ENCOUNTER — Telehealth (INDEPENDENT_AMBULATORY_CARE_PROVIDER_SITE_OTHER): Payer: Self-pay

## 2017-03-17 NOTE — Telephone Encounter (Signed)
P.T. With [facility] called requesting verbal orders for patient for HHPT for 2x/wk for 4 weeks. This verbal was given.

## 2017-03-20 NOTE — Telephone Encounter (Signed)
Entered message from [facility] phone call. See other note in chart

## 2017-03-30 ENCOUNTER — Ambulatory Visit (INDEPENDENT_AMBULATORY_CARE_PROVIDER_SITE_OTHER): Payer: BC Managed Care – PPO | Admitting: Physician Assistant

## 2017-03-30 ENCOUNTER — Ambulatory Visit (INDEPENDENT_AMBULATORY_CARE_PROVIDER_SITE_OTHER): Payer: BC Managed Care – PPO

## 2017-03-30 DIAGNOSIS — S52592D Other fractures of lower end of left radius, subsequent encounter for closed fracture with routine healing: Secondary | ICD-10-CM

## 2017-03-30 DIAGNOSIS — S82141D Displaced bicondylar fracture of right tibia, subsequent encounter for closed fracture with routine healing: Secondary | ICD-10-CM

## 2017-03-30 NOTE — Progress Notes (Signed)
Mrs. hip returns today follow-up open reduction internal fixation of the left distal radius fracture and also closed right tibial plateau fracture. She injured the knee 5 weeks ago the wrist is 4 weeks postop. Knee is being treated conservatively with a hinged knee brace she's been nonweightbearing. She denies any fevers chills shortness breath calf pain. States that the wrist is doing well she should comes in today without the splint on. She's been applying weight to the wrist. She has no complaints in regards to wrist.  Physical exam left wrist surgical incision is healing well. She has good range of motion of the wrist without pain today. Hand neurovascularly intact.  Right knee she has no real tenderness over the lateral tibial plateau region. There is no effusion abnormal warmth erythema or ecchymosis. Calf supple nontender. She has full extension of knee flexion to approximately 90.  Radiographs: AP and lateral views of the right knee shows no change in the tibial plateau fracture. Further sclerotic changes noted. No other fractures identified. No other bony abnormalities.  Plan: We will have her follow with [place] in 3 weeks for radiographs both the wrist and the knee. She will remain in the hinged brace on the right when up ambulating. She is 50% on the right leg. Left wrist she's only be doing any weightbearing on the wrist would suggest she use the brace. Questions were encouraged and answered at length today.

## 2017-04-03 ENCOUNTER — Ambulatory Visit (INDEPENDENT_AMBULATORY_CARE_PROVIDER_SITE_OTHER): Payer: BC Managed Care – PPO | Admitting: Orthopaedic Surgery

## 2017-04-20 ENCOUNTER — Ambulatory Visit (INDEPENDENT_AMBULATORY_CARE_PROVIDER_SITE_OTHER): Payer: BC Managed Care – PPO

## 2017-04-20 ENCOUNTER — Encounter (INDEPENDENT_AMBULATORY_CARE_PROVIDER_SITE_OTHER): Payer: Self-pay | Admitting: Physician Assistant

## 2017-04-20 ENCOUNTER — Ambulatory Visit (INDEPENDENT_AMBULATORY_CARE_PROVIDER_SITE_OTHER): Payer: BC Managed Care – PPO | Admitting: Physician Assistant

## 2017-04-20 DIAGNOSIS — S52502D Unspecified fracture of the lower end of left radius, subsequent encounter for closed fracture with routine healing: Secondary | ICD-10-CM

## 2017-04-20 DIAGNOSIS — S82141D Displaced bicondylar fracture of right tibia, subsequent encounter for closed fracture with routine healing: Secondary | ICD-10-CM

## 2017-04-21 NOTE — Progress Notes (Signed)
Misty Bullock returns today for follow-up of her left distal radius fracture for which she underwent ORIF 48 days ago. She is also following up for a right closed tibial plateau fracture approximately 8 weeks status post injury. She states overall that she is doing much better. She discontinued her wrist brace. She's found finished home health PT. Regards to her knee she has no pain in the knee still wearing a Bledsoe brace. She is ambulating with a walker.  Physical exam general well-developed well-nourished female in no acute distress. Left wrist surgical incision is healed well. No signs of infection. She has good volar flexion limited dorsiflexion of the wrist. Good ulnar and radial deviation. Nontender. Radial pulses 2+. Sensation motor grossly intact throughout the hand. Right knee she has good range of motion of the knee. No instability valgus varus stressing. She ambulates with a rolling walker. Her right knee is definitely in valgus compared to the left.  Radiographs: Left wrist 2 views status post ORIF distal radius fracture without any hardware failure. Overall excellent position alignment fractures well-healed.  Right knee AP and lateral views: The knee has fallen into slight valgus. The lateral tibial plateau fracture is well consolidated. No other fractures bony abnormalities.  Plan: We'll send her to physical therapy to work on quad strengthening right knee. She is weightbearing as tolerated right leg without hinge brace. Also have physical therapy work on range of motion strengthening of the left wrist. She is already discontinued her removable Velcro splint. She is activities tolerated with the left hand. She'll follow up with [place] and month radiographs of the knee only AP and lateral views at that time.

## 2017-04-24 ENCOUNTER — Telehealth (INDEPENDENT_AMBULATORY_CARE_PROVIDER_SITE_OTHER): Payer: Self-pay | Admitting: Physician Assistant

## 2017-04-24 NOTE — Telephone Encounter (Signed)
I called patient to find out what was needed. She states that they need demographic information, a copy of the insurance card, and an order faxed to them even though she has a copy of the order in hand.   She did mention that now that she is weightbearing, her foot in turning in. She notices that she stands straight, however, when she begins to bear weight on that side this happens. She was unsure if strengthening of the ankle should be added to the PT script or if this needs to be evaluated.  Please advise.

## 2017-04-24 NOTE — Telephone Encounter (Signed)
PT CALLED AND STATED [facility] NEEDS SOMETHING FAXED TO THEM WITH PT'S INFORMATION PLEASE.  [place] [phone]  [phone]

## 2017-04-25 ENCOUNTER — Telehealth (INDEPENDENT_AMBULATORY_CARE_PROVIDER_SITE_OTHER): Payer: Self-pay | Admitting: Physician Assistant

## 2017-04-25 NOTE — Telephone Encounter (Signed)
PT STATED SHE WAS CALLED YESTERDAY AND JUST REQUESTED A CALL BACK AGAIN.  [phone]

## 2017-04-25 NOTE — Telephone Encounter (Signed)
They can work on strengthening of the entire right leg including the foot and ankle

## 2017-04-25 NOTE — Telephone Encounter (Signed)
Tried calling patient to discuss. No answer LMVM for her

## 2017-04-26 ENCOUNTER — Telehealth (INDEPENDENT_AMBULATORY_CARE_PROVIDER_SITE_OTHER): Payer: Self-pay | Admitting: Radiology

## 2017-04-26 NOTE — Telephone Encounter (Signed)
Patient left voicemail stating that she thought I had tried to reach her yesterday afternoon. She also has another question about her leg and PT.  She requests a call back.   CB [phone] or [phone]

## 2017-04-26 NOTE — Telephone Encounter (Signed)
faxed

## 2017-04-27 ENCOUNTER — Ambulatory Visit (INDEPENDENT_AMBULATORY_CARE_PROVIDER_SITE_OTHER): Payer: BC Managed Care – PPO

## 2017-04-27 ENCOUNTER — Other Ambulatory Visit (INDEPENDENT_AMBULATORY_CARE_PROVIDER_SITE_OTHER): Payer: Self-pay | Admitting: Radiology

## 2017-04-27 ENCOUNTER — Ambulatory Visit (INDEPENDENT_AMBULATORY_CARE_PROVIDER_SITE_OTHER): Payer: BC Managed Care – PPO | Admitting: Orthopaedic Surgery

## 2017-04-27 ENCOUNTER — Telehealth (INDEPENDENT_AMBULATORY_CARE_PROVIDER_SITE_OTHER): Payer: Self-pay | Admitting: Orthopaedic Surgery

## 2017-04-27 DIAGNOSIS — S82141D Displaced bicondylar fracture of right tibia, subsequent encounter for closed fracture with routine healing: Secondary | ICD-10-CM

## 2017-04-27 DIAGNOSIS — S8264XD Nondisplaced fracture of lateral malleolus of right fibula, subsequent encounter for closed fracture with routine healing: Secondary | ICD-10-CM

## 2017-04-27 DIAGNOSIS — M25571 Pain in right ankle and joints of right foot: Secondary | ICD-10-CM

## 2017-04-27 NOTE — Telephone Encounter (Signed)
That will be fine for her to have a fracture boot.

## 2017-04-27 NOTE — Telephone Encounter (Signed)
Are you ok with patient getting fracture boot?

## 2017-04-27 NOTE — Telephone Encounter (Signed)
Called s/w patient. She actually has been in severe pain x 1 week. Wants xrays of ankle. Worked in to see CB today

## 2017-04-27 NOTE — Progress Notes (Signed)
Office Visit Note   Patient: Misty Bullock           Date of Birth: [birth year]           MRN: [id number] Visit Date: 04/27/2017              Requested by: Arvella Nigh, [address] [place], [place] [address] PCP: Arvella Nigh, MD   Assessment & Plan: Visit Diagnoses:  1. Pain in right ankle and joints of right foot     Plan:Given her osteopenia I would like to treat her in a Cam Walker with weightbearing as tolerated. We'll switch to a smaller hinged knee brace on the right knee in order work on quad strengthening exercises. We'll see her back in 4 weeks see how she doing overall but no x-rays are needed.  Follow-Up Instructions: Return in about 4 weeks (around 05/25/2017).   Orders:  Orders Placed This Encounter  Procedures  . XR Ankle Complete Right   No orders of the defined types were placed in this encounter.     Procedures: No procedures performed   Clinical Data: No additional findings.   Subjective: Chief Complaint  Patient presents with  . Right Ankle - Pain, Follow-up  The patient is well-known to [place]. She's been recovering from a right lateral tibial plateau fracture as well as open reduction and fixation of a left distal radius fracture. She has known osteopenic bone is been getting some significant pain in her right ankle. She came in today to assess the right ankle known that she is prone to fractures from her osteopenia. She's been in a hinged knee brace opened up to full extension and flexion on the right knee with lateral weight-bear as tolerated. The ankle is significantly tenderness on the medial aspect where she points over the tibia. She denies any recent injuries to that area but has become swollen and painful to her.  HPI  Review of Systems He currently denies any headache, chest pain, shortness of breath, fever, chills, nausea, vomiting.  Objective: Vital Signs: There were no vitals taken for this visit.  Physical Exam She is  alert and 3 and in no acute distress Ortho Exam Examination of her right knee shows fluid and full range of motion. Her pain is minimal over the lateral tibial plateau. She does have slight valgus malalignment when she stands. She is exquisitely tender over the medial distal tibia and there is some swelling in this area. She has excellent range of motion of her ankle. Specialty Comments:  No specialty comments available.  Imaging: Xr Ankle Complete Right  Result Date: 04/27/2017 3 views of right ankle show osteopenic bone. I cannot tell whether there is a new or old stress fracture of the lateral malleolus and the distal tibia. The overall alignment is well maintained.    PMFS History: Patient Active Problem List   Diagnosis Date Noted  . Closed fracture of left distal radius 03/03/2017  . Fracture, Colles, left, closed 03/01/2017  . Closed fracture of right tibial plateau 02/23/2017  . Closed nondisplaced fracture of lateral malleolus of right fibula 02/23/2017   No past medical history on file.  No family history on file.  Past Surgical History:  Procedure Laterality Date  . bilateral blerphoplasty     . cataract surgery     . OPEN REDUCTION INTERNAL FIXATION (ORIF) DISTAL RADIAL FRACTURE Left 03/03/2017   Procedure: OPEN REDUCTION INTERNAL FIXATION (ORIF) LEFT DISTAL RADIUS FRACTURE;  Surgeon: Mcarthur Rossetti, MD;  Location: [facility];  Service: Orthopedics;  Laterality: Left;  . WISDOM TOOTH EXTRACTION     Social History   Occupational History  . Not on file.   Social History Main Topics  . Smoking status: Never Smoker  . Smokeless tobacco: Never Used  . Alcohol use 0.0 oz/week     Comment: wine  . Drug use: No  . Sexual activity: Not on file

## 2017-04-27 NOTE — Telephone Encounter (Signed)
Patient called stating that she has a lot of pain in her right ankle. Her PT suggested her get a stabilization boot to see if that would help. Should she make an appointment or can you just send the RX in? CB # [phone]

## 2017-04-27 NOTE — Telephone Encounter (Signed)
Phone call from [facility], they did not receive the PT order/demographic info.  I have faxed an order to them again, with demographic info.  To fax number below.

## 2017-05-05 ENCOUNTER — Telehealth (INDEPENDENT_AMBULATORY_CARE_PROVIDER_SITE_OTHER): Payer: Self-pay | Admitting: Orthopaedic Surgery

## 2017-05-05 NOTE — Telephone Encounter (Signed)
Plan of Care faxed 05/05/17 ° °

## 2017-05-08 ENCOUNTER — Ambulatory Visit (INDEPENDENT_AMBULATORY_CARE_PROVIDER_SITE_OTHER): Payer: BC Managed Care – PPO | Admitting: Physician Assistant

## 2017-05-22 ENCOUNTER — Ambulatory Visit (INDEPENDENT_AMBULATORY_CARE_PROVIDER_SITE_OTHER): Payer: BC Managed Care – PPO | Admitting: Physician Assistant

## 2017-05-24 ENCOUNTER — Encounter (INDEPENDENT_AMBULATORY_CARE_PROVIDER_SITE_OTHER): Payer: Self-pay | Admitting: Orthopaedic Surgery

## 2017-05-24 ENCOUNTER — Ambulatory Visit (INDEPENDENT_AMBULATORY_CARE_PROVIDER_SITE_OTHER): Payer: BC Managed Care – PPO | Admitting: Orthopaedic Surgery

## 2017-05-24 DIAGNOSIS — S82141D Displaced bicondylar fracture of right tibia, subsequent encounter for closed fracture with routine healing: Secondary | ICD-10-CM | POA: Diagnosis not present

## 2017-05-24 DIAGNOSIS — S52502D Unspecified fracture of the lower end of left radius, subsequent encounter for closed fracture with routine healing: Secondary | ICD-10-CM

## 2017-05-24 NOTE — Progress Notes (Signed)
The patient is continue to follow-up from a right lateral tibial plateau fracture as well as a left distal radius fracture. The distal radius did require surgery. The right knee did not. She is joint physical therapy now as a working on strengthening her knee. She says it doesn't bend and is much more bow and is much as it did before. She's been in a cam walking boot on her right ankle due to pain. Her left leg is been having some pain as well but this may be compensatory.  On exam both ankles show fluid range of motion with minimal pain. I do feel that she can transition to an ASO on the right ankle. She can continue physical therapy as well. Her range of motion of her left wrist and right knee are excellent and full. She still work on strengthening of the knee on the right side as well. We'll see her back for final visit in 4 weeks. I do not need x-rays of the knee or wrist at that visit.

## 2017-06-21 ENCOUNTER — Ambulatory Visit (INDEPENDENT_AMBULATORY_CARE_PROVIDER_SITE_OTHER): Payer: BC Managed Care – PPO | Admitting: Orthopaedic Surgery

## 2017-06-21 DIAGNOSIS — S52502D Unspecified fracture of the lower end of left radius, subsequent encounter for closed fracture with routine healing: Secondary | ICD-10-CM

## 2017-06-21 DIAGNOSIS — S82141D Displaced bicondylar fracture of right tibia, subsequent encounter for closed fracture with routine healing: Secondary | ICD-10-CM | POA: Diagnosis not present

## 2017-06-21 NOTE — Progress Notes (Signed)
Patient is now just over 4 months out from open reduction and fixation of left distal radius fracture. She also had nonoperative treatment of the right knee lateral tibial plateau fracture. She's been dealing with right ankle swelling and pain as well. She is in today now not needing an assistive device to ambulate. She's getting better overall. She says therapy would like to continue strengthening her lower extremities for least 5 more visits to the end of September with this. She is doing much better overall. Parafon examination she has much improved alignment of her left wrist with excellent range of motion. Her right knee also has excellent range of motion and her valgus malalignment is improved dramatically as well to more of a neutral alignment. He still has some right ankle swelling but she tolerates it from a pain standpoint now.  At this point she can follow-up as needed from our standpoint. She understands that he can take 6 months to a year to fully recover from injury she's had but she is making excellent progress. I did give her a note to give to the physical therapist to continue 5 more visits of physical therapy as an outpatient. All questions were encouraged and answered.

## 2017-07-11 ENCOUNTER — Telehealth (INDEPENDENT_AMBULATORY_CARE_PROVIDER_SITE_OTHER): Payer: Self-pay | Admitting: Orthopaedic Surgery

## 2017-07-11 NOTE — Telephone Encounter (Signed)
Sierra from [facility] called asking if Dr. Ninfa Linden had received the summary sent over by fax for the patient. If so could you please call give her a call. CB # [phone]

## 2017-07-12 NOTE — Telephone Encounter (Signed)
Sierra at PT aware of the below message

## 2017-07-12 NOTE — Telephone Encounter (Signed)
Let them know that I did see their physical therapy note and it is scanned into Epic. I agree with him continuing to see her through the end of this month and I agree with all the recommendations.

## 2018-03-29 ENCOUNTER — Ambulatory Visit (INDEPENDENT_AMBULATORY_CARE_PROVIDER_SITE_OTHER): Payer: BC Managed Care – PPO | Admitting: Family Medicine

## 2018-03-29 ENCOUNTER — Encounter: Payer: Self-pay | Admitting: Family Medicine

## 2018-03-29 VITALS — BP 104/86 | HR 71 | Temp 98.3°F | Ht 66.5 in | Wt 126.6 lb

## 2018-03-29 DIAGNOSIS — Z1159 Encounter for screening for other viral diseases: Secondary | ICD-10-CM

## 2018-03-29 DIAGNOSIS — Z23 Encounter for immunization: Secondary | ICD-10-CM | POA: Diagnosis not present

## 2018-03-29 DIAGNOSIS — Z0001 Encounter for general adult medical examination with abnormal findings: Secondary | ICD-10-CM | POA: Diagnosis not present

## 2018-03-29 DIAGNOSIS — G2581 Restless legs syndrome: Secondary | ICD-10-CM | POA: Diagnosis not present

## 2018-03-29 DIAGNOSIS — G4700 Insomnia, unspecified: Secondary | ICD-10-CM | POA: Insufficient documentation

## 2018-03-29 DIAGNOSIS — Z0000 Encounter for general adult medical examination without abnormal findings: Secondary | ICD-10-CM

## 2018-03-29 DIAGNOSIS — F419 Anxiety disorder, unspecified: Secondary | ICD-10-CM | POA: Diagnosis not present

## 2018-03-29 DIAGNOSIS — F5101 Primary insomnia: Secondary | ICD-10-CM

## 2018-03-29 DIAGNOSIS — Z114 Encounter for screening for human immunodeficiency virus [HIV]: Secondary | ICD-10-CM

## 2018-03-29 DIAGNOSIS — D1723 Benign lipomatous neoplasm of skin and subcutaneous tissue of right leg: Secondary | ICD-10-CM | POA: Diagnosis not present

## 2018-03-29 MED ORDER — ZOLPIDEM TARTRATE 10 MG PO TABS: ORAL_TABLET | ORAL | 0 refills | Status: DC

## 2018-03-29 MED ORDER — FLUOXETINE HCL 20 MG PO CAPS: 20.0000 mg | ORAL_CAPSULE | Freq: Every day | ORAL | 3 refills | Status: DC

## 2018-03-29 MED ORDER — ALPRAZOLAM 0.5 MG PO TABS: 0.5000 mg | ORAL_TABLET | Freq: Every day | ORAL | 0 refills | Status: DC

## 2018-03-29 NOTE — Patient Instructions (Signed)
Please try to incorporate the following into your daily routine:  1. Sleep only long enough to feel rested and then get out of bed  2. Go to bed and get up at the same time every day  3. Do not try to force yourself to sleep. If you can't sleep, get out of bed and try again later.  4. Have coffee, tea, and other foods that have caffeine only in the morning  5. Avoid alcohol in the late afternoon, evening, and bedtime  6. Avoid smoking, especially in the evening  7. Keep your bedroom dark, cool, quiet, and free of reminders of work or other things that cause you stress  8. Solve problems you have before you go to bed  9. Exercise several days a week, but not right before bed  10. Avoid looking at phones or reading devices ("e-books") that give off light before bed. This can make it harder to fall asleep.   

## 2018-03-29 NOTE — Progress Notes (Signed)
Patient: Misty Bullock MRN: [id number] DOB: 12/08/1955 PCP: Orma Flaming, MD     Subjective:  Chief Complaint  Patient presents with  . Establish Care    med refills    HPI: The patient is a 62 y.o. female who presents today for annual exam. She denies any changes to past medical history. There have been no recent hospitalizations. They are following a well balanced diet and exercise plan. Weight has been stable. No complaints today. Is needing a refill of her medications.   Anxiety: currently on prozac 20mg  and xanax 0.5mg . She has been on prozac 20mg  for years and really feels like this keeps her "even." She is needing a refill of this. She only takes xanax on a very as needed basis and first was given this when her son had an addiction issue. Her last refill of xanax was in 07/2017. She likes to have this on hand. She feels like her anxiety is well controlled. NO panic attacks, no complaints and no si/hi.   Small lump on leg: She has a small lump on her right lower leg that she first noticed about one year ago. She was told it was a lipoma and not to worry about it. It has grown since that time and is starting to become tender. She would like me to look at this.   Insomnia: has been on 5mg  of ambien for "years." needing a refill of this.   Immunization History  Administered Date(s) Administered  . Tdap 03/29/2018  . Zoster Recombinat (Shingrix) 11/15/2017, 01/27/2018   Colonoscopy: 2016. F/u in 5 years.  Mammogram: 07/2017 Pap smear: 07/2017 Tdap: today  Hep c/hiv: today   Review of Systems  Constitutional: Negative for chills, fatigue and fever.  HENT: Negative for dental problem, ear pain, hearing loss and trouble swallowing.   Eyes: Negative for visual disturbance.  Respiratory: Negative for cough, chest tightness and shortness of breath.   Cardiovascular: Negative for chest pain, palpitations and leg swelling.  Gastrointestinal: Negative for abdominal pain, blood in  stool, diarrhea and nausea.  Endocrine: Negative for cold intolerance, polydipsia, polyphagia and polyuria.  Genitourinary: Negative for dysuria and hematuria.  Musculoskeletal: Negative for arthralgias.  Skin: Negative for rash.       Small lump on right lower leg   Neurological: Negative for dizziness and headaches.  Psychiatric/Behavioral: Negative for dysphoric mood and sleep disturbance. The patient is not nervous/anxious.     Allergies Patient is allergic to latex.  Past Medical History Patient  has a past medical history of Anxiety, History of chicken pox, Insomnia, and Restless leg syndrome.  Surgical History Patient  has a past surgical history that includes Wisdom tooth extraction; cataract surgery ; bilateral blerphoplasty ; and Open reduction internal fixation (orif) distal radial fracture (Left, 03/03/2017).  Family History Pateint's family history is not on file.  Social History Patient  reports that she has never smoked. She has never used smokeless tobacco. She reports that she drinks alcohol. She reports that she does not use drugs.    Objective: Vitals:   03/29/18 1329  BP: 104/86  Pulse: 71  Temp: 98.3 F (36.8 C)  TempSrc: Oral  SpO2: 98%  Weight: 126 lb 9.6 oz (57.4 kg)  Height: 5' 6.5" (1.689 m)    Body mass index is 20.13 kg/m.  Physical Exam  Constitutional: She is oriented to person, place, and time. She appears well-developed and well-nourished.  HENT:  Right Ear: External ear normal.  Left Ear: External  ear normal.  Mouth/Throat: Oropharynx is clear and moist.  Eyes: Pupils are equal, round, and reactive to light. Conjunctivae and EOM are normal.  Neck: Normal range of motion. Neck supple. No thyromegaly present.  Cardiovascular: Normal rate, regular rhythm, normal heart sounds and intact distal pulses.  No murmur heard. Negative carotid bruits bilaterally   Pulmonary/Chest: Effort normal and breath sounds normal.  Abdominal: Soft. Bowel  sounds are normal. She exhibits no distension. There is no tenderness.  Lymphadenopathy:    She has no cervical adenopathy.  Neurological: She is alert and oriented to person, place, and time. She displays normal reflexes. No cranial nerve deficit. Coordination normal.  Skin: Skin is warm and dry. No rash noted.  Small fluctuant mass on right anterior tibia. No erythema, slightly tender to palpation.   Psychiatric: She has a normal mood and affect. Her behavior is normal.  Vitals reviewed.       Office Visit from 03/29/2018 in [facility]  PHQ-2 Total Score  3       GAD 7 : Generalized Anxiety Score 03/29/2018  Nervous, Anxious, on Edge 0  Control/stop worrying 0  Worry too much - different things 0  Trouble relaxing 0  Restless 0  Easily annoyed or irritable 0  Afraid - awful might happen 1  Total GAD 7 Score 1  Anxiety Difficulty Not difficult at all      Office Visit from 03/29/2018 in [facility]  PHQ-9 Total Score  4      Assessment/plan: 1. Need for prophylactic vaccination with combined diphtheria-tetanus-pertussis (DTP) vaccine  - Tdap vaccine greater than or equal to 7yo IM  2. Annual physical exam Routine lab work today. Requesting records for all of her previous diagnoses/HM/management. Encouraged her to use sun protection and stop her weekly sun tanning in the bed. Increased risk for melanoma or other skin cancers. Otherwise she is doing well. F/u in one year or as needed.  Patient counseling [x]    Nutrition: Stressed importance of moderation in sodium/caffeine intake, saturated fat and cholesterol, caloric balance, sufficient intake of fresh fruits, vegetables, fiber, calcium, iron, and 1 mg of folate supplement per day (for females capable of pregnancy).  [x]    Stressed the importance of regular exercise.   [x]    Substance Abuse: Discussed cessation/primary prevention of tobacco, alcohol, or other drug use; driving  or other dangerous activities under the influence; availability of treatment for abuse.   [x]    Injury prevention: Discussed safety belts, safety helmets, smoke detector, smoking near bedding or upholstery.   [x]    Sexuality: Discussed sexually transmitted diseases, partner selection, use of condoms, avoidance of unintended pregnancy  and contraceptive alternatives.  [x]    Dental health: Discussed importance of regular tooth brushing, flossing, and dental visits.  [x]    Health maintenance and immunizations reviewed. Please refer to Health maintenance section.    - CBC with Differential/Platelet - Comprehensive metabolic panel - Lipid panel - TSH  3. Encounter for screening for HIV - HIV antibody  4. Encounter for hepatitis C screening test for low risk patient  - Hepatitis C antibody  5. RLS (restless legs syndrome) Could not tolerate requip and 100mg  of gabapentin did nothing. Will check her ferritin level as hx of anemia from chart and go from there. Would increase her gabapentin if ferritin normal.  - Ferritin  6. Anxiety Very well controlled on her medication. GAD7 score excellent. Continue current medication. Refills given x 1 year of the  prozac. PMP webiste checked and verified that she last filled xanax in 07/2017. 30 day supply given with  No refills.   7. Primary insomnia Discussed sleep hygiene. Not practicing some of these things. Has been on [place] for years. Will continue with the 5mg  dosage. Long term risks discussed with her and she is okay with this medication. Gave her very strict instructions that she can not take with the xanax and she understands this.    8. Lipoma of right lower extremity Growing and tender. Will get removed. Referral to surgery done.  - Ambulatory referral to General Surgery   Return in about 1 year (around 03/30/2019).    Orma Flaming, MD [place]  03/29/2018

## 2018-03-29 NOTE — Progress Notes (Deleted)
Patient: Misty Bullock MRN: [id number] DOB: Oct 09, 1956 PCP: Orma Flaming, MD     Subjective:  Chief Complaint  Patient presents with  . Establish Care    med refills    HPI: The patient is a 62 y.o. female who presents today for ***  Review of Systems  Constitutional: Negative for activity change and fatigue.  HENT: Positive for congestion. Negative for sinus pain.   Respiratory: Negative for shortness of breath.   Cardiovascular: Negative for chest pain and leg swelling.  Gastrointestinal: Negative for abdominal pain, nausea and vomiting.  Musculoskeletal: Negative for back pain, joint swelling and neck pain.  Neurological: Negative for dizziness and headaches.  Psychiatric/Behavioral: Negative for agitation. The patient is not nervous/anxious.     Allergies Patient is allergic to latex.  Past Medical History Patient  has no past medical history on file.  Surgical History Patient  has a past surgical history that includes Wisdom tooth extraction; cataract surgery ; bilateral blerphoplasty ; and Open reduction internal fixation (orif) distal radial fracture (Left, 03/03/2017).  Family History Pateint's family history is not on file.  Social History Patient  reports that she has never smoked. She has never used smokeless tobacco. She reports that she drinks alcohol. She reports that she does not use drugs.    Objective: Vitals:   03/29/18 1329  BP: 104/86  Pulse: 71  Temp: 98.3 F (36.8 C)  TempSrc: Oral  SpO2: 98%  Weight: 126 lb 9.6 oz (57.4 kg)  Height: 5' 6.5" (1.689 m)    Body mass index is 20.13 kg/m.  Physical Exam     Assessment/plan:      No follow-ups on file.     @[facility] @ 03/29/2018

## 2018-03-30 LAB — CBC WITH DIFFERENTIAL/PLATELET
BASOS PCT: 1.6 % (ref 0.0–3.0)
Basophils Absolute: 0.1 10*3/uL (ref 0.0–0.1)
Eosinophils Absolute: 0.1 10*3/uL (ref 0.0–0.7)
Eosinophils Relative: 2.4 % (ref 0.0–5.0)
HCT: 41.9 % (ref 36.0–46.0)
Hemoglobin: 14 g/dL (ref 12.0–15.0)
Lymphocytes Relative: 22.8 % (ref 12.0–46.0)
Lymphs Abs: 1.2 10*3/uL (ref 0.7–4.0)
MCHC: 33.5 g/dL (ref 30.0–36.0)
MCV: 95.4 fl (ref 78.0–100.0)
MONO ABS: 0.6 10*3/uL (ref 0.1–1.0)
Monocytes Relative: 12 % (ref 3.0–12.0)
NEUTROS ABS: 3.1 10*3/uL (ref 1.4–7.7)
Neutrophils Relative %: 61.2 % (ref 43.0–77.0)
PLATELETS: 322 10*3/uL (ref 150.0–400.0)
RBC: 4.39 Mil/uL (ref 3.87–5.11)
RDW: 12.4 % (ref 11.5–15.5)
WBC: 5.1 10*3/uL (ref 4.0–10.5)

## 2018-03-30 LAB — COMPREHENSIVE METABOLIC PANEL
ALT: 15 U/L (ref 0–35)
AST: 29 U/L (ref 0–37)
Albumin: 4.8 g/dL (ref 3.5–5.2)
Alkaline Phosphatase: 65 U/L (ref 39–117)
BUN: 10 mg/dL (ref 6–23)
CALCIUM: 10.2 mg/dL (ref 8.4–10.5)
CHLORIDE: 98 meq/L (ref 96–112)
CO2: 28 meq/L (ref 19–32)
CREATININE: 0.72 mg/dL (ref 0.40–1.20)
GFR: 86.71 mL/min (ref >60.00)
Glucose, Bld: 90 mg/dL (ref 70–99)
POTASSIUM: 5.1 meq/L (ref 3.5–5.1)
SODIUM: 135 meq/L (ref 135–145)
Total Bilirubin: 0.4 mg/dL (ref 0.2–1.2)
Total Protein: 7.8 g/dL (ref 6.0–8.3)

## 2018-03-30 LAB — LIPID PANEL
CHOL/HDL RATIO: 2
Cholesterol: 290 mg/dL — ABNORMAL HIGH (ref 0–200)
HDL: 131.9 mg/dL (ref >39.00)
LDL CALC: 146 mg/dL — AB (ref 0–99)
NonHDL: 157.79
TRIGLYCERIDES: 59 mg/dL (ref 0.0–149.0)
VLDL: 11.8 mg/dL (ref 0.0–40.0)

## 2018-03-30 LAB — FERRITIN: Ferritin: 98.3 ng/mL (ref 10.0–291.0)

## 2018-03-30 LAB — TSH: TSH: 1.09 u[IU]/mL (ref 0.35–4.50)

## 2018-03-30 LAB — HIV ANTIBODY (ROUTINE TESTING W REFLEX): HIV 1&2 Ab, 4th Generation: NONREACTIVE

## 2018-03-30 LAB — HEPATITIS C ANTIBODY
HEP C AB: NONREACTIVE
SIGNAL TO CUT-OFF: 0.02 (ref <1.00)

## 2018-04-11 ENCOUNTER — Encounter (INDEPENDENT_AMBULATORY_CARE_PROVIDER_SITE_OTHER): Payer: Self-pay | Admitting: Physician Assistant

## 2018-04-11 ENCOUNTER — Ambulatory Visit (INDEPENDENT_AMBULATORY_CARE_PROVIDER_SITE_OTHER): Payer: BC Managed Care – PPO | Admitting: Physician Assistant

## 2018-04-11 ENCOUNTER — Ambulatory Visit (INDEPENDENT_AMBULATORY_CARE_PROVIDER_SITE_OTHER): Payer: Self-pay

## 2018-04-11 DIAGNOSIS — M79661 Pain in right lower leg: Secondary | ICD-10-CM

## 2018-04-11 NOTE — Progress Notes (Signed)
Office Visit Note   Patient: Misty Bullock           Date of Birth: July 07, 1956           MRN: [id number] Visit Date: 04/11/2018              Requested by: Orma Flaming, [address] [place], [place] [address] PCP: Orma Flaming, MD   Assessment & Plan: Visit Diagnoses:  1. Pain in right lower leg     Plan: Discussed with Misty Bullock that she should refrain from any high impact activities for at least 5 to 9 weeks depending upon her pain and soreness in the right leg.  She can try knee sleeve or an Ace bandage for compression this may help with her overall discomfort.  She will follow-up with [place] if pain becomes worse or does not resolve.  Follow-Up Instructions: Return if symptoms worsen or fail to improve.   Orders:  Orders Placed This Encounter  Procedures  . XR Tibia/Fibula Right   No orders of the defined types were placed in this encounter.     Procedures: No procedures performed   Clinical Data: No additional findings.   Subjective: Chief Complaint  Patient presents with  . Right Lower Leg - Pain    HPI Ms. hip is well-known to Dr. Ninfa Linden service history of right lateral tibial plateau fracture a year ago.  She comes in today now with right lower leg pain for 3 weeks no known injury.  She does have osteoporosis and is on Fosamax.  She is began working out 2-3 times a week.  She states that now that she is having the right lower leg pain she is stopped walking and exercising.  She is notes no swelling discoloration of the right lower leg.  She is taken some ibuprofen which helps.  She feels that rest ice also help.  Review of Systems Please see HPI otherwise negative  Objective: Vital Signs: There were no vitals taken for this visit.  Physical Exam  Constitutional: She is oriented to person, place, and time. She appears well-developed and well-nourished. No distress.  Pulmonary/Chest: Effort normal.  Neurological: She is alert and oriented to person,  place, and time.  Skin: She is not diaphoretic.  Psychiatric: She has a normal mood and affect.    Ortho Exam Right lower leg calf supple nontender.  There is no rash skin lesions ulcerations or edema of the right lower leg.  She has tenderness over the right proximal one fourth of the fibula.  Good range of motion of the right knee without pain. Specialty Comments:  No specialty comments available.  Imaging: Xr Tibia/fibula Right  Result Date: 04/11/2018 Radiographs right tib-fib shows both the ankle in the joint to be well maintained.  The lateral tibial plateau fracture is well-healed.  She has a small stress fracture through the proximal one fourth of the fibular shaft.  No other fractures identified.  No other bony abnormalities.    PMFS History: Patient Active Problem List   Diagnosis Date Noted  . RLS (restless legs syndrome) 03/29/2018  . Anxiety 03/29/2018  . Insomnia 03/29/2018  . Closed fracture of left distal radius 03/03/2017  . Fracture, Colles, left, closed 03/01/2017  . Closed fracture of right tibial plateau 02/23/2017  . Closed nondisplaced fracture of lateral malleolus of right fibula 02/23/2017   Past Medical History:  Diagnosis Date  . Anxiety   . History of chicken pox   .  Insomnia   . Restless leg syndrome     No family history on file.  Past Surgical History:  Procedure Laterality Date  . bilateral blerphoplasty     . cataract surgery     . OPEN REDUCTION INTERNAL FIXATION (ORIF) DISTAL RADIAL FRACTURE Left 03/03/2017   Procedure: OPEN REDUCTION INTERNAL FIXATION (ORIF) LEFT DISTAL RADIUS FRACTURE;  Surgeon: Mcarthur Rossetti, MD;  Location: [facility];  Service: Orthopedics;  Laterality: Left;  . WISDOM TOOTH EXTRACTION     Social History   Occupational History  . Not on file  Tobacco Use  . Smoking status: Never Smoker  . Smokeless tobacco: Never Used  Substance and Sexual Activity  . Alcohol use: Yes    Alcohol/week: 0.0 oz    Comment:  wine  . Drug use: No  . Sexual activity: Not on file

## 2018-05-04 ENCOUNTER — Telehealth (INDEPENDENT_AMBULATORY_CARE_PROVIDER_SITE_OTHER): Payer: Self-pay | Admitting: Orthopaedic Surgery

## 2018-05-04 DIAGNOSIS — S82141D Displaced bicondylar fracture of right tibia, subsequent encounter for closed fracture with routine healing: Secondary | ICD-10-CM

## 2018-05-04 NOTE — Telephone Encounter (Signed)
Patient called asked if she can be referred for (PT) Patient want to go where she went the last time. The number to contact patient is [phone]

## 2018-05-04 NOTE — Telephone Encounter (Signed)
Ok for orders? 

## 2018-05-05 NOTE — Telephone Encounter (Signed)
[facility] for PT to work on strengthening her legs and any modalities to decrease her pain.

## 2018-05-07 NOTE — Telephone Encounter (Signed)
PT referral entered in epic

## 2018-05-28 ENCOUNTER — Other Ambulatory Visit: Payer: Self-pay | Admitting: Family Medicine

## 2018-05-31 ENCOUNTER — Telehealth: Payer: Self-pay

## 2018-05-31 NOTE — Progress Notes (Signed)
Received fax from patient's insurance co stating the PA for Zolpidem 10 mg tablets would expire before pt got rx filled again.  Called and spoke with patient who states that she went to pharmacy last night to pick up rx and it was in fact, covered, says she only had to pay $1 for rx.  I advised patient we would disregard fax from ins. And if she had any further problems, to please contact us.  She verbalized understanding.

## 2018-05-31 NOTE — Telephone Encounter (Signed)
Attempted to contact pt on her cell phone; no answer.  Left voicemail msg requesting call back.

## 2018-07-27 ENCOUNTER — Other Ambulatory Visit: Payer: Self-pay | Admitting: Family Medicine

## 2018-07-30 ENCOUNTER — Other Ambulatory Visit: Payer: Self-pay | Admitting: Family Medicine

## 2018-07-31 NOTE — Telephone Encounter (Signed)
Sent in for her

## 2018-07-31 NOTE — Telephone Encounter (Signed)
pls see msg and advise.  PMP aware last fill date 05/30/18  Last OV: 03/29/18.

## 2018-08-02 IMAGING — CR DG TIBIA/FIBULA 2V*R*
2 series · 2 of 2 positions shown · non-contrast
Comparison: No prior.  Concurrent knee and ankle radiographs.

CLINICAL DATA: Tripped over dog, fall onto hardwood floor. Right
knee, lower leg and ankle pain. Swelling.

EXAM:
RIGHT TIBIA AND FIBULA - 2 VIEW

[x tib-fib ap right]
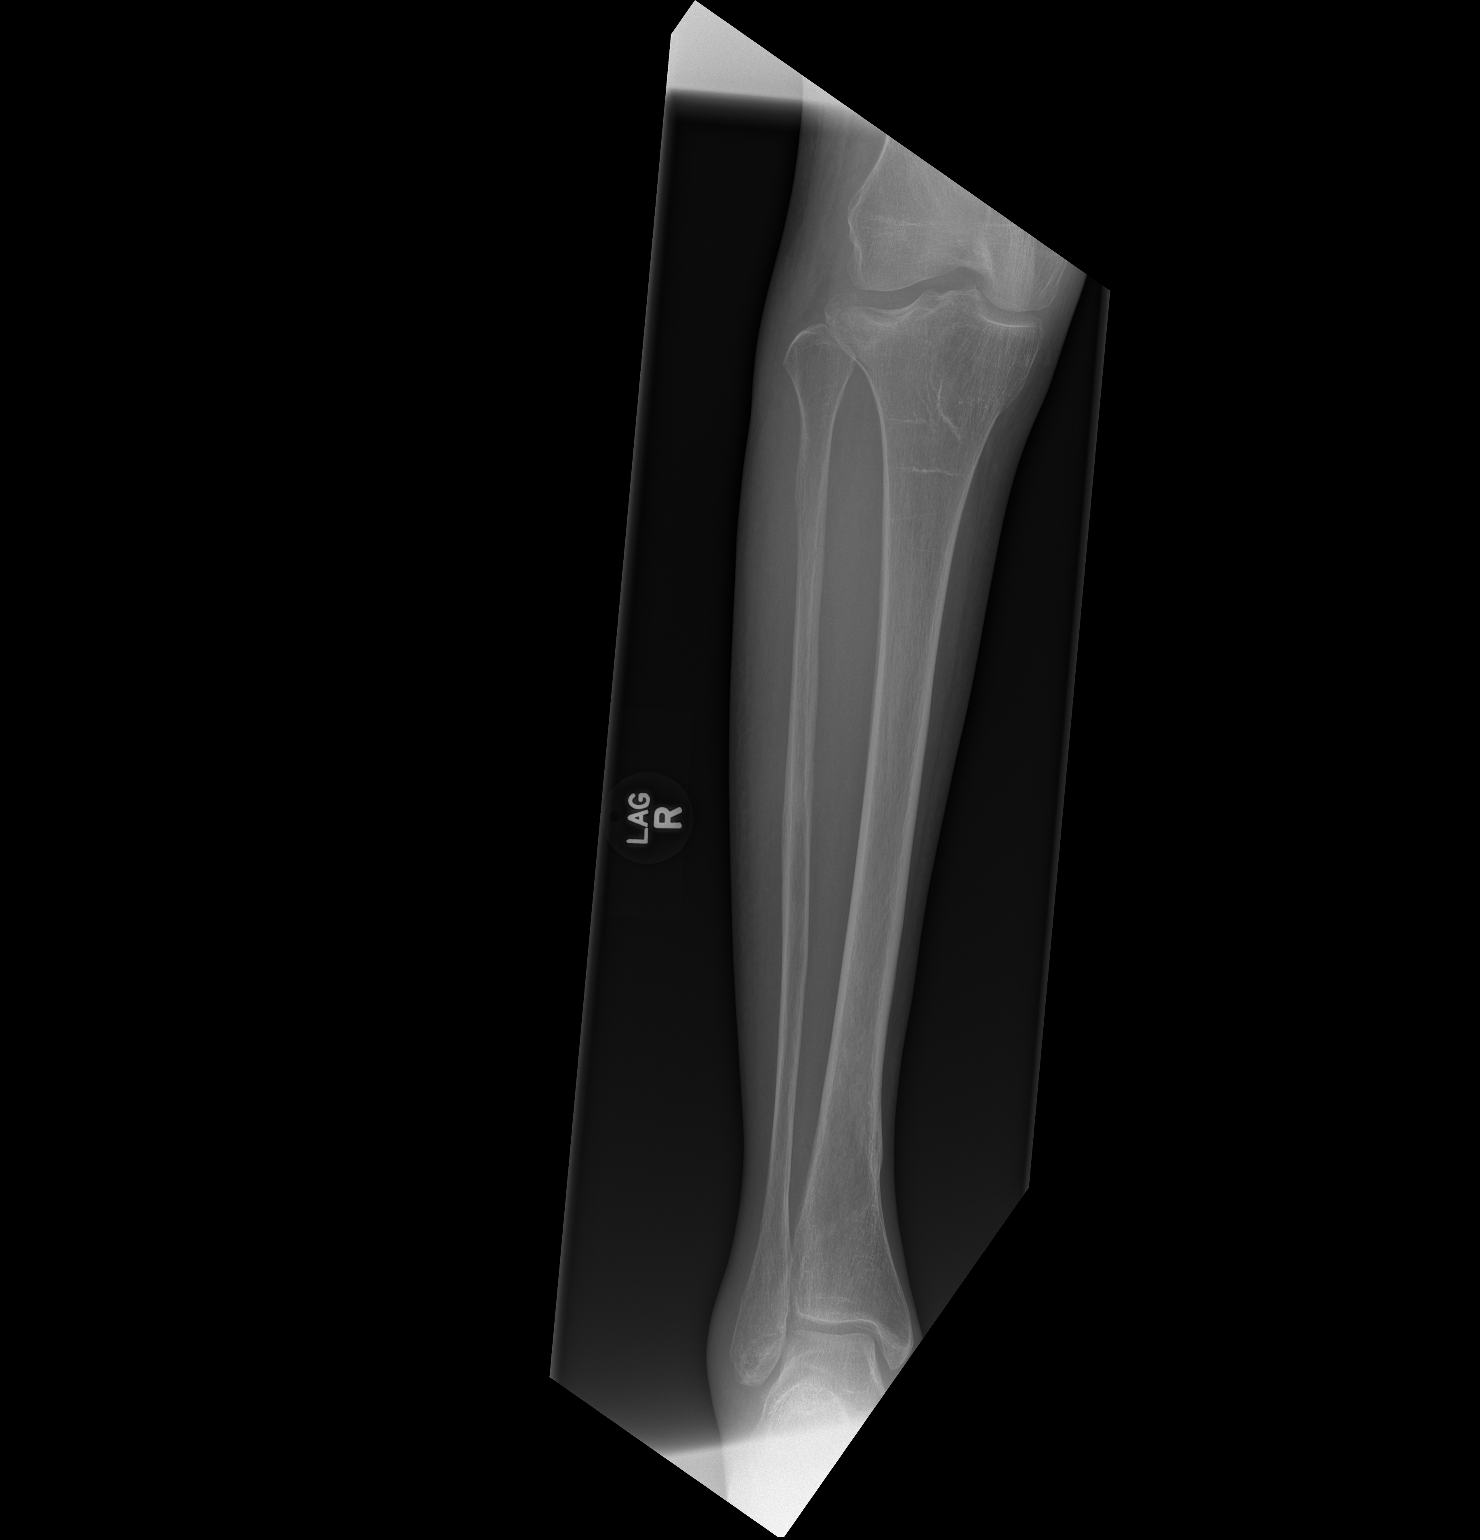

[x tib-fib lat right]
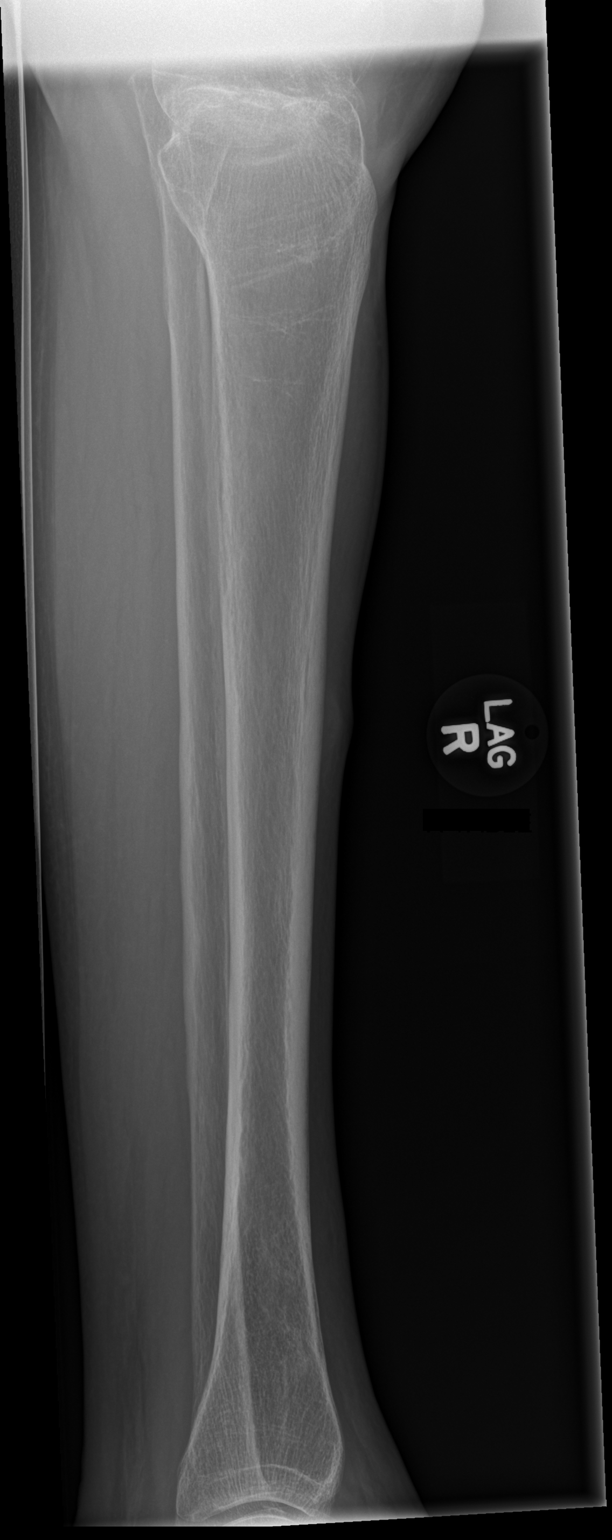

[2 of 2 positions shown; findings below may reference images not displayed]

FINDINGS: Knee joint and ankle fractures better characterized on dedicated
joint exams. Tibial and fibular shafts are intact. Soft tissue edema
about the lateral malleolus. Soft tissue edema and joint effusion
about the knee.
IMPRESSION: Knee joint and lateral malleolar fractures better characterized on
concurrent dedicated joint exams. Diffuse bony under mineralization.

## 2018-08-02 IMAGING — CR DG KNEE COMPLETE 4+V*R*
4 series · 4 of 4 positions shown · non-contrast
Comparison: None.

CLINICAL DATA: Tripped over dog onto hardwood floor. Left knee
pain.

EXAM:
RIGHT KNEE - COMPLETE 4+ VIEW

[x knee ap right]
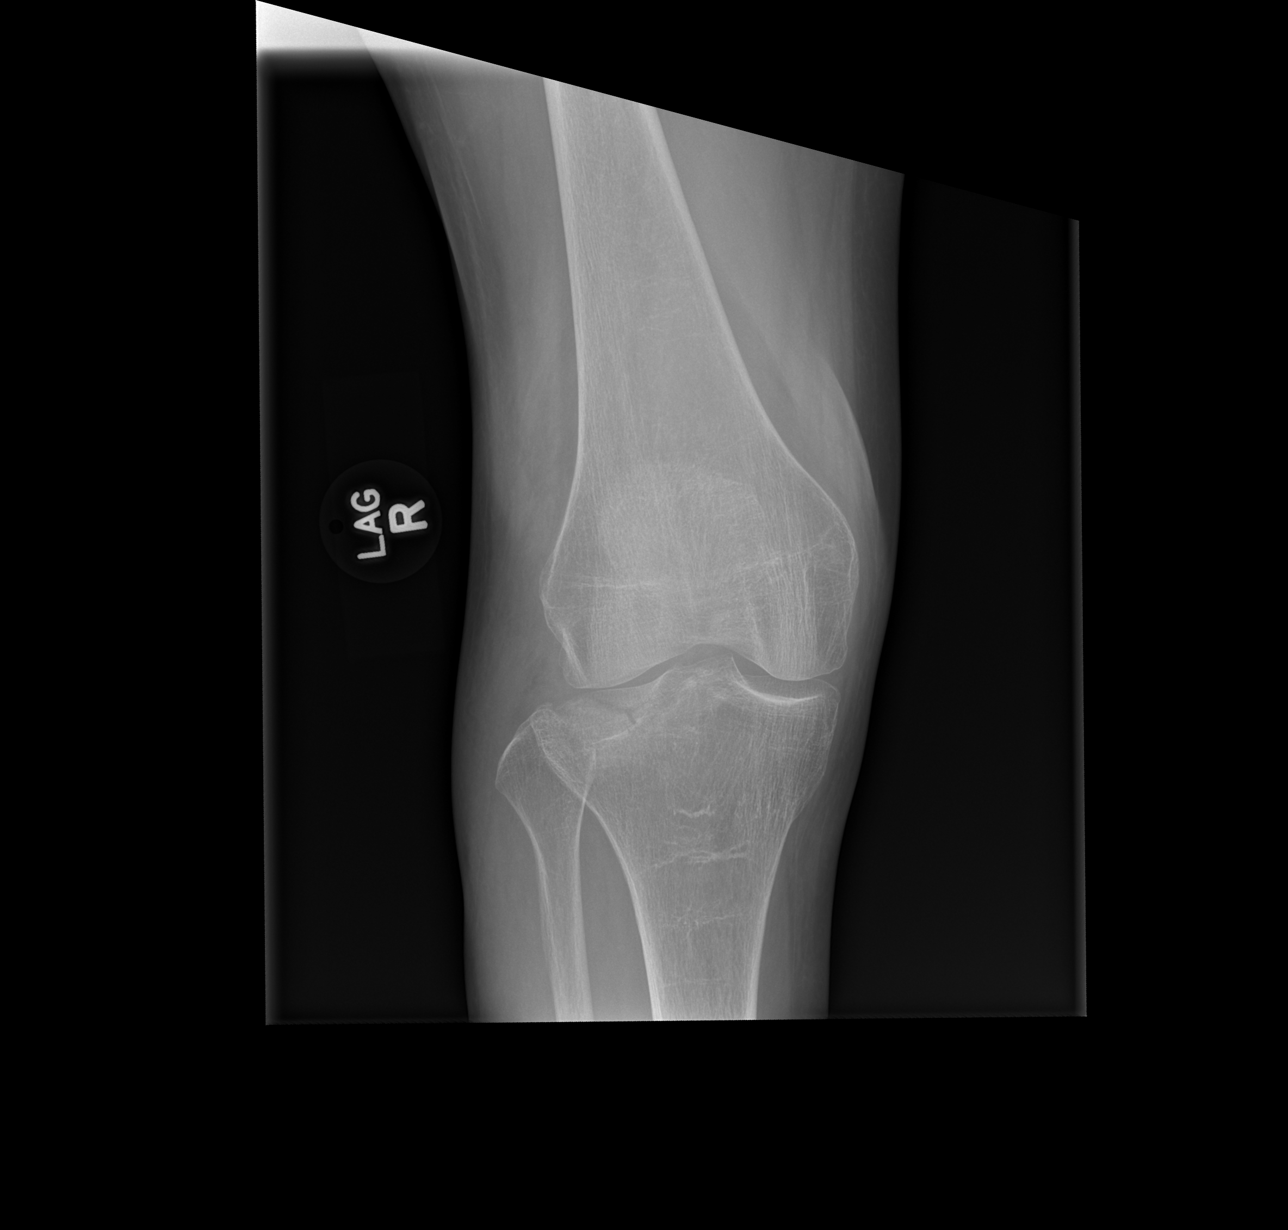

[x knee obl right (1 of 2)]
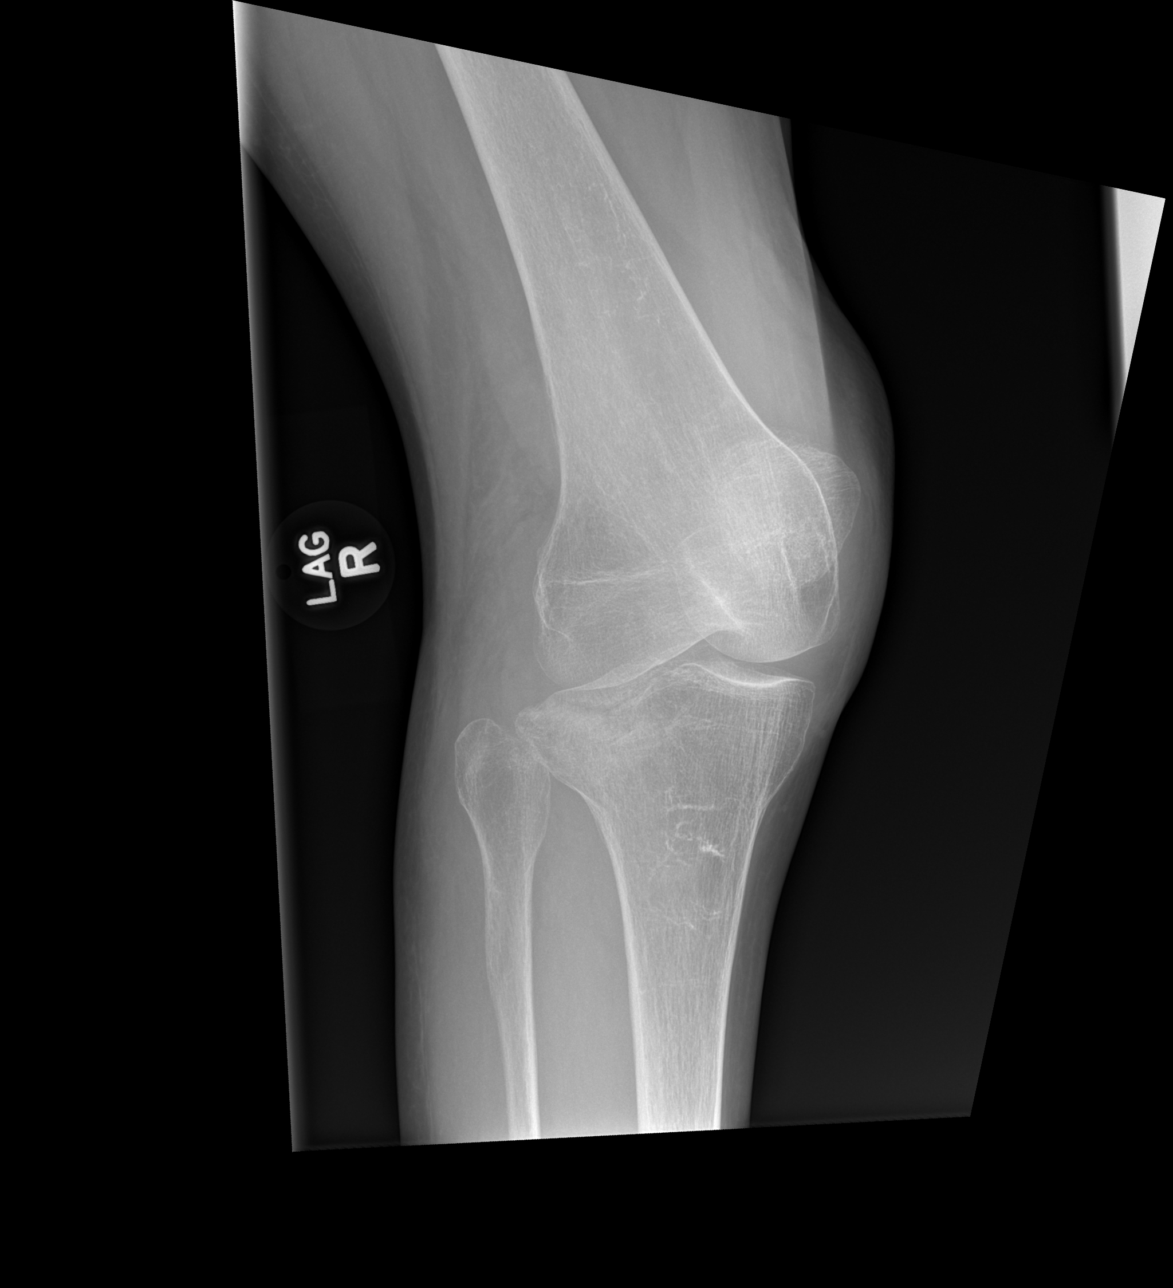

[x knee obl right (2 of 2)]
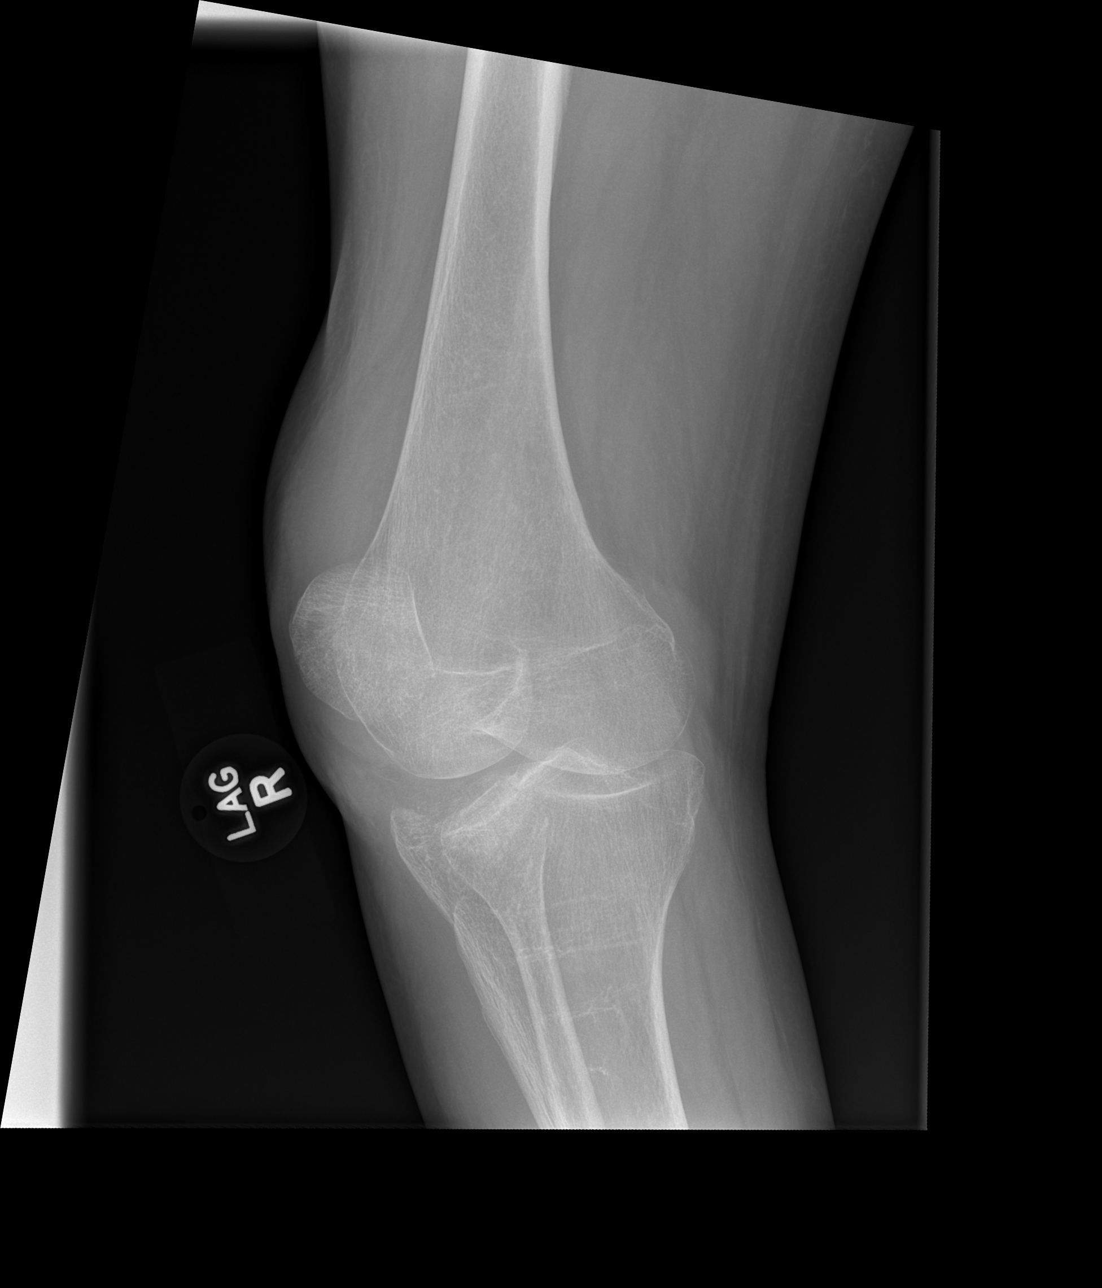

[x knee lat right]
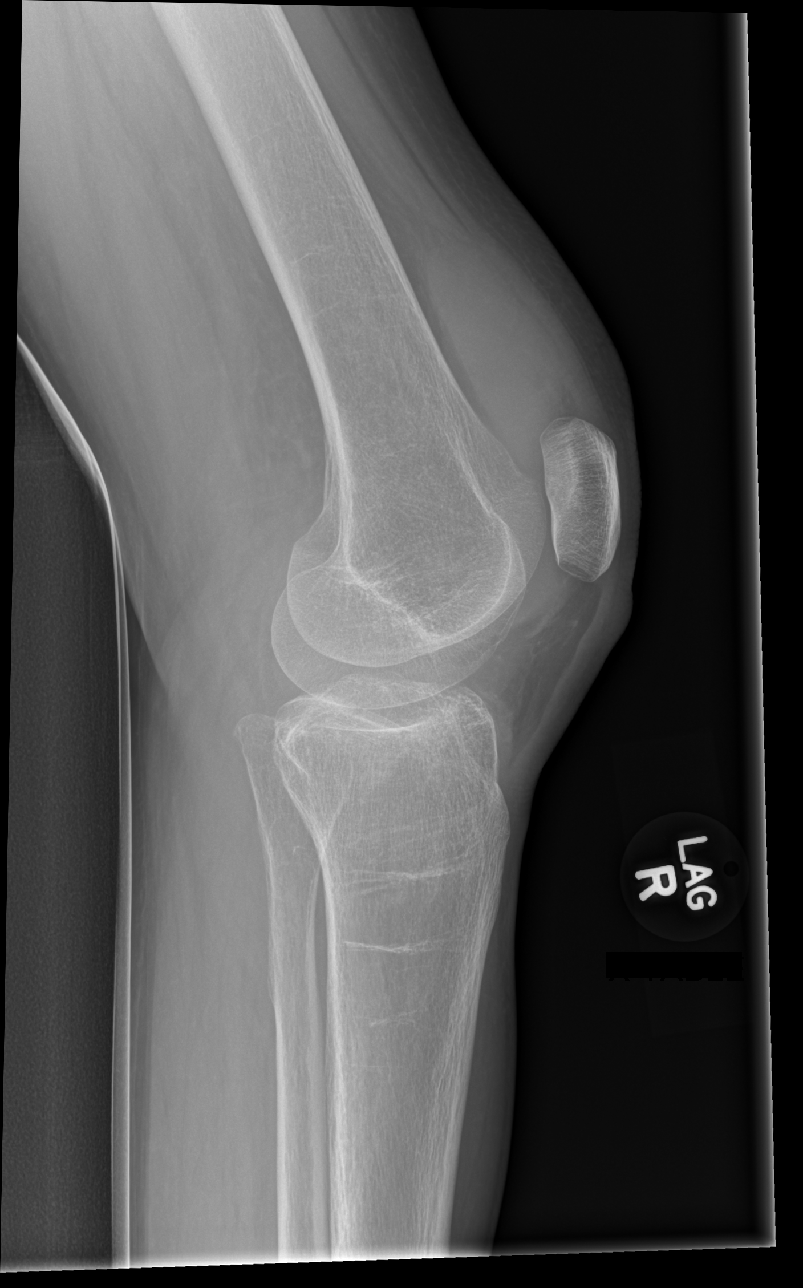

[4 of 4 positions shown; findings below may reference images not displayed]

FINDINGS: Mildly comminuted lateral tibial plateau fracture. Mild central
depression. No definite involvement of the medial tibial plateau.
Proximal fibula is intact. Moderate joint effusion. The bones are
diffusely under mineralized.
IMPRESSION: Comminuted lateral tibial plateau fracture with central depression.

## 2018-09-27 ENCOUNTER — Other Ambulatory Visit: Payer: Self-pay | Admitting: Family Medicine

## 2018-09-28 NOTE — Telephone Encounter (Signed)
Message forwarded to Dr. Rogers Blocker and she will send in to pharmacy.

## 2018-09-28 NOTE — Telephone Encounter (Signed)
Pt called in and scheduled med refill OV for Monday.  Pt only has two pills left and wants to know if this can go ahead and be called in for her.

## 2018-09-28 NOTE — Telephone Encounter (Signed)
See note

## 2018-09-29 ENCOUNTER — Other Ambulatory Visit: Payer: Self-pay | Admitting: Family Medicine

## 2018-09-29 MED ORDER — ZOLPIDEM TARTRATE 10 MG PO TABS: ORAL_TABLET | ORAL | 1 refills | Status: DC

## 2018-09-29 NOTE — Progress Notes (Signed)
Per nurse, patient needing refill of ambien per verbal notification. Do not have refill request and do not  See note in chart. She has not filled since october. Refill sent in for her.   Orma Flaming, MD [place]

## 2018-10-01 ENCOUNTER — Encounter: Payer: Self-pay | Admitting: Family Medicine

## 2018-10-01 ENCOUNTER — Ambulatory Visit: Payer: BC Managed Care – PPO | Admitting: Family Medicine

## 2018-10-01 VITALS — BP 110/70 | HR 74 | Temp 97.9°F | Ht 66.5 in | Wt 130.8 lb

## 2018-10-01 DIAGNOSIS — F419 Anxiety disorder, unspecified: Secondary | ICD-10-CM | POA: Diagnosis not present

## 2018-10-01 DIAGNOSIS — F5101 Primary insomnia: Secondary | ICD-10-CM | POA: Diagnosis not present

## 2018-10-01 MED ORDER — ALPRAZOLAM 0.5 MG PO TABS: 0.5000 mg | ORAL_TABLET | Freq: Every day | ORAL | 1 refills | Status: DC | PRN

## 2018-10-01 NOTE — Progress Notes (Signed)
Patient: Misty Bullock MRN: [id number] DOB: 1956-05-08 PCP: Orma Flaming, MD     Subjective:  Chief Complaint  Patient presents with  . Medication Refill    HPI: The patient is a 62 y.o. female who presents today for medication refill of her ambien and xanax. She is on both for insomnia and anxiety. She is on prozac daily and does quite well with this. She is needing a refill of just her xanax as I refilled her ambien on Saturday for her. She last refilled her xanax in October, only take on prn basis. Fills/takes appropriately. No new issues. Doing well.   Review of Systems  Constitutional: Negative for chills, fatigue and fever.  HENT: Negative for dental problem, ear pain, hearing loss and trouble swallowing.   Eyes: Negative for visual disturbance.  Respiratory: Negative for cough, chest tightness and shortness of breath.   Cardiovascular: Negative for chest pain, palpitations and leg swelling.  Gastrointestinal: Negative for abdominal pain, blood in stool, diarrhea and nausea.  Endocrine: Negative for cold intolerance, polydipsia, polyphagia and polyuria.  Genitourinary: Negative for dysuria and hematuria.  Musculoskeletal: Negative for arthralgias.  Skin: Negative for rash.  Neurological: Negative for dizziness and headaches.  Psychiatric/Behavioral: Negative for dysphoric mood and sleep disturbance. The patient is not nervous/anxious.     Allergies Patient is allergic to latex.  Past Medical History Patient  has a past medical history of Anxiety, History of chicken pox, Insomnia, and Restless leg syndrome.  Surgical History Patient  has a past surgical history that includes Wisdom tooth extraction; cataract surgery ; bilateral blerphoplasty ; and Open reduction internal fixation (orif) distal radial fracture (Left, 03/03/2017).  Family History Pateint's family history is not on file.  Social History Patient  reports that she has never smoked. She has never used  smokeless tobacco. She reports current alcohol use. She reports that she does not use drugs.    Objective: Vitals:   10/01/18 1416  BP: 110/70  Pulse: 74  Temp: 97.9 F (36.6 C)  TempSrc: Oral  SpO2: 99%  Weight: 130 lb 12.8 oz (59.3 kg)  Height: 5' 6.5" (1.689 m)    Body mass index is 20.8 kg/m.  Physical Exam Vitals signs reviewed.  Constitutional:      Appearance: She is well-developed.  HENT:     Right Ear: External ear normal.     Left Ear: External ear normal.  Neck:     Musculoskeletal: Normal range of motion and neck supple.     Thyroid: No thyromegaly.  Cardiovascular:     Rate and Rhythm: Normal rate and regular rhythm.     Heart sounds: Normal heart sounds. No murmur.  Pulmonary:     Effort: Pulmonary effort is normal.     Breath sounds: Normal breath sounds.  Abdominal:     General: Bowel sounds are normal. There is no distension.     Palpations: Abdomen is soft.     Tenderness: There is no abdominal tenderness.  Lymphadenopathy:     Cervical: No cervical adenopathy.  Skin:    General: Skin is warm and dry.     Findings: No rash.  Neurological:     Mental Status: She is alert and oriented to person, place, and time.  Psychiatric:        Mood and Affect: Mood normal.        Behavior: Behavior normal.        Assessment/plan: 1. Anxiety Well controlled on her prozac and xanax prn.  No refill of her prozac needed. Refilled her xanax that she takes on prn basis. Im fine seeing her once/year. She will f/u for annual in 6-9 months (after my maternity leave).   2. Primary insomnia Does well on 5mg  of ambien. Refills not needed as I just filled for her on Saturday. F/u per above.   -pmp checked/reviewed. Filling correctly with no other fills by different provider.    Return in about 9 months (around 07/03/2019) for annual/med refill .   Orma Flaming, MD [place]   10/01/2018

## 2019-01-30 ENCOUNTER — Other Ambulatory Visit: Payer: Self-pay | Admitting: Family Medicine

## 2019-01-30 NOTE — Telephone Encounter (Signed)
Pt states that she only gets 1 refill on her zolpidem (AMBIEN) 10 MG tablet and ALPRAZolam (XANAX) 0.5 MG tablet. She would like to get at least 2 refills since her MD will be out of office for a while

## 2019-01-31 NOTE — Telephone Encounter (Signed)
See request °

## 2019-04-08 ENCOUNTER — Other Ambulatory Visit: Payer: Self-pay | Admitting: Family Medicine

## 2019-06-02 ENCOUNTER — Other Ambulatory Visit: Payer: Self-pay | Admitting: Family Medicine

## 2019-07-30 ENCOUNTER — Other Ambulatory Visit: Payer: Self-pay | Admitting: Family Medicine

## 2019-07-31 NOTE — Telephone Encounter (Signed)
Dr wolfe patient. 

## 2019-08-08 ENCOUNTER — Telehealth: Payer: Self-pay

## 2019-08-08 NOTE — Telephone Encounter (Signed)
Copied from [place] [id number]. Topic: General - Other >> Aug 08, 2019 12:13 PM Burchel, Abbi R wrote: Reason for CRM: Pt requesting a call back from Dr Rogers Blocker or her Nurse.  Please call pt: [phone]

## 2019-08-09 ENCOUNTER — Telehealth: Payer: Self-pay | Admitting: Family Medicine

## 2019-08-09 NOTE — Telephone Encounter (Signed)
Patient stopped by the office to drop off some paperwork for Dr. Rogers Blocker (I did put it in Dr. Shelby Mattocks folder per patient request). After talking with Amber, the paperwork was basically saying that her medicare insurance would not cover the medication. I let the patient know that she could either pay for the medication out of pocket or I could send a message to Dr. Rogers Blocker about possibly prescribing a different medication that would be covered by insurance.   Patient asked that I go ahead and send the message to Dr. Rogers Blocker, but she was going to stop by and see what the cost would be out of pocket first. Patient will call back to let us know if we will need to send in a new medication or if she will be able to purchase the original prescription out of pocket.   Original prescription for :  zolpidem (AMBIEN) 10 MG tablet   And [id number] [place], [place]

## 2019-08-09 NOTE — Telephone Encounter (Signed)
Noted  

## 2019-08-09 NOTE — Telephone Encounter (Signed)
See other message

## 2019-08-13 LAB — HM DEXA SCAN

## 2019-08-14 ENCOUNTER — Telehealth: Payer: Self-pay

## 2019-08-14 NOTE — Telephone Encounter (Signed)
Left detailed vm message for patient advising that we r/c fax from [facility] requesting a PA for her Ambien.  We r/c the same form from them on 05/31/18 (see telephone encounter dated the same) Requested a c/b from patient to discuss further.

## 2019-08-20 NOTE — Telephone Encounter (Signed)
Please see message and advise 

## 2019-08-20 NOTE — Telephone Encounter (Addendum)
Pt called back to advise she is on medicare now and they will not pay for Ambien. She would like to ask dr Rogers Blocker for an alternate medication for sleep.  Pt states he Misty Bullock is not working that well for her anyway.  [id number] [place], [place] [phone] (Phone) [phone] (Fax)   ALSO pt needs welcome to medicare visit with Dr Rogers Blocker by the end of year, and there is nothing available .  Please advise if ok to work pt in.

## 2019-08-21 ENCOUNTER — Other Ambulatory Visit: Payer: Self-pay

## 2019-08-21 ENCOUNTER — Encounter: Payer: Self-pay | Admitting: Family Medicine

## 2019-08-21 ENCOUNTER — Ambulatory Visit (INDEPENDENT_AMBULATORY_CARE_PROVIDER_SITE_OTHER): Payer: Medicare Other | Admitting: Family Medicine

## 2019-08-21 VITALS — Ht 66.5 in | Wt 129.0 lb

## 2019-08-21 DIAGNOSIS — F5101 Primary insomnia: Secondary | ICD-10-CM

## 2019-08-21 MED ORDER — TRAZODONE HCL 50 MG PO TABS: 25.0000 mg | ORAL_TABLET | Freq: Every evening | ORAL | 3 refills | Status: DC | PRN

## 2019-08-21 NOTE — Telephone Encounter (Signed)
Tried to contact pt no answer went straight to voicemail which was not set up. Unable to leave message.

## 2019-08-21 NOTE — Progress Notes (Signed)
Patient: Misty Bullock MRN: [id number] DOB: 1956-01-05 PCP: Orma Flaming, MD     I connected with Almetta Gervin Marcello on 08/21/19 at 1:34 pm by a video enabled telemedicine application and verified that I am speaking with the correct person using two identifiers.  Location patient: Home Location provider: [facility], Office Persons participating in this virtual visit: Marajade Tailor and Dr. Rogers Blocker   I discussed the limitations of evaluation and management by telemedicine and the availability of in person appointments. The patient expressed understanding and agreed to proceed.   Subjective:  Chief Complaint  Patient presents with  . Medication Management    HPI: The patient is a 63 y.o. female who presents today for insomnia. She is currently on ambien 5mg , but has been overusing this. She states it no longer works. She will take 5mg  then be up then take another 5mg  pill then another. She didn't go to sleep until 5:00AM this morning. She states the [place] has not been working for months. She can not fall asleep and can stay asleep once she is asleep. She will normally fall asleep at 5 am and sleep until noon. No caffeine and no naps after 2pm. She is not exercising. She uses her bed for sleep and does watch TV in bed. Has struggled with insomnia for year, also has RLS.   Review of Systems  Constitutional: Positive for fatigue.  Respiratory: Negative for shortness of breath.   Cardiovascular: Negative for chest pain, palpitations and leg swelling.  Gastrointestinal: Negative for abdominal pain, diarrhea, nausea and vomiting.  Musculoskeletal: Negative for neck pain and neck stiffness.  Neurological: Negative for dizziness and headaches.  Psychiatric/Behavioral: Positive for sleep disturbance.    Allergies Patient is allergic to latex.  Past Medical History Patient  has a past medical history of Anxiety, History of chicken pox, Insomnia, and Restless leg syndrome.  Surgical History Patient  has  a past surgical history that includes Wisdom tooth extraction; cataract surgery ; bilateral blerphoplasty ; and Open reduction internal fixation (orif) distal radial fracture (Left, 03/03/2017).  Family History Pateint's family history is not on file.  Social History Patient  reports that she has never smoked. She has never used smokeless tobacco. She reports current alcohol use. She reports that she does not use drugs.    Objective: Vitals:   08/21/19 0909  Weight: 129 lb (58.5 kg)  Height: 5' 6.5" (1.689 m)    Body mass index is 20.51 kg/m.  Physical Exam Vitals signs reviewed.  Constitutional:      Appearance: Normal appearance.  HENT:     Head: Normocephalic and atraumatic.  Pulmonary:     Effort: Pulmonary effort is normal.  Neurological:     General: No focal deficit present.     Mental Status: She is alert and oriented to person, place, and time.  Psychiatric:        Mood and Affect: Mood normal.        Behavior: Behavior normal.        Assessment/plan: 1. Primary insomnia Discussed sleep hygiene in detail and what she can start implementing at home. Want her to work on getting up at a certain time early in day daily to set up a normal sleep wake cycle. She also needs to start exercising and practicing no screen time before bed. We are going to do a trial of ashwagandha and trazodone. Stop ambien completely. She can also look at drug formulary and see what else they  May  cover, usually limited though. F/u with me for annual/welcome to medicare and insomnia.      Return in about 1 month (around 09/20/2019) for welcome to medicare/insomnia f/u .  Records requested if needed. Time spent with patient: 15 minutes, of which >50% was spent in obtaining information about her symptoms, reviweing her previous labs, evaluations, and treatments, counseling her about her conditions (please see discussed topics above), and developing a plan to further investigate it; she had a  number of questions which I addressed.    Orma Flaming, MD [place]  08/21/2019

## 2019-08-21 NOTE — Telephone Encounter (Signed)
Then she will need an appointment.  Thanks!  Dr. Rogers Blocker

## 2019-08-21 NOTE — Telephone Encounter (Signed)
Called and spoke with patient, scheduled for doxy visit today 08/21/19 @ 1:40 pm.

## 2019-09-12 ENCOUNTER — Other Ambulatory Visit: Payer: Self-pay | Admitting: Family Medicine

## 2019-09-26 ENCOUNTER — Ambulatory Visit: Payer: Self-pay

## 2019-09-26 NOTE — Telephone Encounter (Signed)
Noted.  Will forward to Dr. Rogers Blocker for review.

## 2019-09-26 NOTE — Telephone Encounter (Signed)
Incoming call from Patient who complains  Of Congestion since Monday.  Has not progressed. Denies cough,  Fever Does report no taste or smell. 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 Recommended patient take covid -19 test or self quarantine  To see if further Sx develope.  Patient  Provided Patient website address to make appointment.  Voiced understanding.            Answer Assessment - Initial Assessment Questions 1. COVID-19 DIAGNOSIS: "Who made your Coronavirus (COVID-19) diagnosis?" "Was it confirmed by a positive lab test?" If not diagnosed by a HCP, ask "Are there lots of cases (community spread) where you live?" (See public health department website, if unsure)     *No Answer* 2. COVID-19 EXPOSURE: "Was there any known exposure to [place] before the symptoms began?" CDC Definition of close contact: within 6 feet (2 meters) for a total of 15 minutes or more over a 24-hour period.      *No Answer* 3. ONSET: "When did the COVID-19 symptoms start?"      [place]4. WORST SYMPTOM: "What is your worst symptom?" (e.g., cough, fever, shortness of breath, muscle aches)    congestion 5. COUGH: "Do you have a cough?" If so, ask: "How bad is the cough?"       6. FEVER: "Do you have a fever?" If so, ask: "What is your temperature, how was it measured, and when did it start?"     denies 7. RESPIRATORY STATUS: "Describe your breathing?" (e.g., shortness of breath, wheezing, unable to speak)     denies 8. BETTER-SAME-WORSE: "Are you getting better, staying the same or getting worse compared to yesterday?"  If getting worse, ask, "In what way?"     The same 9. HIGH RISK DISEASE: "Do you have any chronic medical problems?" (e.g., asthma, heart or lung disease, weak immune system, obesity, etc.)     denies 10. PREGNANCY: "Is there any chance you are pregnant?" "When was your last menstrual period?"      na 11. OTHER SYMPTOMS: "Do you have any other symptoms?"  (e.g., chills, fatigue, headache, loss of  smell or taste, muscle pain, sore throat; new loss of smell or taste especially support the diagnosis of COVID-19)      Loss of taste or smell  Protocols used: CORONAVIRUS (COVID-19) DIAGNOSED OR SUSPECTED-A-AH

## 2019-09-27 ENCOUNTER — Telehealth: Payer: Self-pay | Admitting: Family Medicine

## 2019-09-27 ENCOUNTER — Other Ambulatory Visit: Payer: Self-pay | Admitting: Family Medicine

## 2019-09-27 MED ORDER — ALPRAZOLAM 0.5 MG PO TABS: 0.5000 mg | ORAL_TABLET | Freq: Every evening | ORAL | 1 refills | Status: DC | PRN

## 2019-09-27 NOTE — Telephone Encounter (Signed)
See note  Copied from [place] [id number]. Topic: General - Inquiry >> Sep 27, 2019  1:09 PM Mathis Bud wrote: Reason for CRM: Patient is calling to let PCP know that she is waiting in line to get covid test. Call back [phone]

## 2019-09-27 NOTE — Telephone Encounter (Signed)
Pt wants refill on Ambien and Xanax.

## 2019-09-27 NOTE — Telephone Encounter (Signed)
We stopped [place] and she was going to trazodone since [place] not workin for years.  Sent in xanax.

## 2019-09-27 NOTE — Telephone Encounter (Signed)
Noted  

## 2019-09-27 NOTE — Telephone Encounter (Signed)
Please call and see if there is anything else we can do for her. I would get tested as she was told yesterday.  Merry christmas  Dr. Rogers Blocker

## 2019-09-27 NOTE — Telephone Encounter (Signed)
Called and lm on pt vm tcb, calling to see how she is feeling today and if she got covid tested.

## 2019-09-27 NOTE — Telephone Encounter (Signed)
Pt is getting covid tested today.

## 2019-09-27 NOTE — Telephone Encounter (Signed)
Copied from [place] [id number]. Topic: Quick Communication - Rx Refill/Question >> Sep 27, 2019  3:22 PM Leward Quan A wrote: Medication: ALPRAZolam Duanne Moron) 0.5 MG tablet, zolpidem (AMBIEN) 10 MG tablet  Has the patient contacted their pharmacy? Yes.   (Agent: If no, request that the patient contact the pharmacy for the refill.) (Agent: If yes, when and what did the pharmacy advise?)  Preferred Pharmacy (with phone number or street name[id number] [place], [place]  Phone:  [phone] Fax:  [phone]     Agent: Please be advised that RX refills may take up to 3 business days. We ask that you follow-up with your pharmacy.

## 2019-09-27 NOTE — Telephone Encounter (Signed)
See note

## 2019-09-28 ENCOUNTER — Other Ambulatory Visit: Payer: Self-pay | Admitting: Family Medicine

## 2019-09-30 ENCOUNTER — Ambulatory Visit: Payer: Medicare Other | Admitting: Family Medicine

## 2019-09-30 ENCOUNTER — Telehealth: Payer: Self-pay | Admitting: Family Medicine

## 2019-09-30 NOTE — Telephone Encounter (Signed)
Pt called back. °

## 2019-09-30 NOTE — Telephone Encounter (Signed)
See note

## 2019-09-30 NOTE — Telephone Encounter (Signed)
Left vm message for patient requesting a c/b regarding ambien refill request.

## 2019-09-30 NOTE — Telephone Encounter (Signed)
We had a doxy the other week and she was stopping this as it didn't work. What happened?  Orma Flaming, MD [place]

## 2019-09-30 NOTE — Telephone Encounter (Signed)
Pt is calling and need a refill on generic ambien 10 mg she take half pill at night and medicare does not cover this medication and she will pay [facility] of pocket. [facility] [address]

## 2019-10-01 NOTE — Telephone Encounter (Signed)
See note

## 2019-10-01 NOTE — Telephone Encounter (Signed)
Spoke to patient.  She states that the Trazodone that was prescribed during her doxy visit does not help.  Ambien is the only thing that helps her sleep.  She has been trying taking 1/2 tablet q hs w/success.  She states that she is willing to pay out of pocket if medication can be sent it to pharmacy for her.  I did explain to patient that Dr. Rogers Blocker is out of the office today and will not return until [facility] 12/23.  Patient verbalized understanding.

## 2019-10-02 ENCOUNTER — Other Ambulatory Visit: Payer: Self-pay | Admitting: Family Medicine

## 2019-10-02 MED ORDER — ZOLPIDEM TARTRATE 10 MG PO TABS: 5.0000 mg | ORAL_TABLET | Freq: Every evening | ORAL | 1 refills | Status: DC | PRN

## 2019-10-02 NOTE — Telephone Encounter (Signed)
I explained to patient on 12/22 that if she did not hear back from me that she could assume that medication had been sent in for her.

## 2019-10-02 NOTE — Telephone Encounter (Signed)
Okay!  Sounds good. I sent in for her.  Orma Flaming, MD [place]

## 2019-10-27 ENCOUNTER — Other Ambulatory Visit: Payer: Self-pay | Admitting: Family Medicine

## 2019-12-04 ENCOUNTER — Ambulatory Visit (INDEPENDENT_AMBULATORY_CARE_PROVIDER_SITE_OTHER): Payer: Medicare PPO | Admitting: Family Medicine

## 2019-12-04 ENCOUNTER — Other Ambulatory Visit: Payer: Self-pay

## 2019-12-04 ENCOUNTER — Encounter: Payer: Self-pay | Admitting: Family Medicine

## 2019-12-04 VITALS — BP 110/68 | HR 72 | Temp 98.0°F | Ht 66.5 in | Wt 123.6 lb

## 2019-12-04 DIAGNOSIS — M810 Age-related osteoporosis without current pathological fracture: Secondary | ICD-10-CM

## 2019-12-04 DIAGNOSIS — E782 Mixed hyperlipidemia: Secondary | ICD-10-CM | POA: Diagnosis not present

## 2019-12-04 DIAGNOSIS — E785 Hyperlipidemia, unspecified: Secondary | ICD-10-CM | POA: Insufficient documentation

## 2019-12-04 DIAGNOSIS — Z0000 Encounter for general adult medical examination without abnormal findings: Secondary | ICD-10-CM | POA: Diagnosis not present

## 2019-12-04 LAB — COMPREHENSIVE METABOLIC PANEL
ALT: 20 U/L (ref 0–35)
AST: 37 U/L (ref 0–37)
Albumin: 4.3 g/dL (ref 3.5–5.2)
Alkaline Phosphatase: 47 U/L (ref 39–117)
BUN: 9 mg/dL (ref 6–23)
CO2: 28 mEq/L (ref 19–32)
Calcium: 9.7 mg/dL (ref 8.4–10.5)
Chloride: 101 mEq/L (ref 96–112)
Creatinine, Ser: 0.78 mg/dL (ref 0.40–1.20)
GFR: 73.99 mL/min (ref >60.00)
Glucose, Bld: 104 mg/dL — ABNORMAL HIGH (ref 70–99)
Potassium: 4.7 mEq/L (ref 3.5–5.1)
Sodium: 138 mEq/L (ref 135–145)
Total Bilirubin: 0.6 mg/dL (ref 0.2–1.2)
Total Protein: 7 g/dL (ref 6.0–8.3)

## 2019-12-04 LAB — CBC WITH DIFFERENTIAL/PLATELET
Basophils Absolute: 0 10*3/uL (ref 0.0–0.1)
Basophils Relative: 1 % (ref 0.0–3.0)
Eosinophils Absolute: 0 10*3/uL (ref 0.0–0.7)
Eosinophils Relative: 0.9 % (ref 0.0–5.0)
HCT: 42.6 % (ref 36.0–46.0)
Hemoglobin: 14.2 g/dL (ref 12.0–15.0)
Lymphocytes Relative: 27.1 % (ref 12.0–46.0)
Lymphs Abs: 1 10*3/uL (ref 0.7–4.0)
MCHC: 33.3 g/dL (ref 30.0–36.0)
MCV: 95.3 fl (ref 78.0–100.0)
Monocytes Absolute: 0.5 10*3/uL (ref 0.1–1.0)
Monocytes Relative: 15.2 % — ABNORMAL HIGH (ref 3.0–12.0)
Neutro Abs: 2 10*3/uL (ref 1.4–7.7)
Neutrophils Relative %: 55.8 % (ref 43.0–77.0)
Platelets: 211 10*3/uL (ref 150.0–400.0)
RBC: 4.47 Mil/uL (ref 3.87–5.11)
RDW: 12.9 % (ref 11.5–15.5)
WBC: 3.6 10*3/uL — ABNORMAL LOW (ref 4.0–10.5)

## 2019-12-04 LAB — LIPID PANEL
Cholesterol: 266 mg/dL — ABNORMAL HIGH (ref 0–200)
HDL: 102.5 mg/dL (ref >39.00)
LDL Cholesterol: 153 mg/dL — ABNORMAL HIGH (ref 0–99)
NonHDL: 163.88
Total CHOL/HDL Ratio: 3
Triglycerides: 52 mg/dL (ref 0.0–149.0)
VLDL: 10.4 mg/dL (ref 0.0–40.0)

## 2019-12-04 LAB — VITAMIN D 25 HYDROXY (VIT D DEFICIENCY, FRACTURES): VITD: 99.28 ng/mL (ref 30.00–100.00)

## 2019-12-04 NOTE — Progress Notes (Signed)
Phone: [phone]  Subjective:  Patient presents today for their Welcome to Medicare Exam   1) hyperlipidemia: on on medication.  -ascvd risk only 3.6% on last check.   2) insomnia: takes 5mg  of ambien nightly. Trial of trazodone didn't work. No refills needed.   3) anxiety: currently on prozac 20mg . Doing well.   Preventive Screening-Counseling & Management  Vision screen:   Hearing Screening   Method: Audiometry   125Hz  250Hz  500Hz  1000Hz  2000Hz  3000Hz  4000Hz  6000Hz  8000Hz   Right ear:           Left ear:             Visual Acuity Screening   Right eye Left eye Both eyes  Without correction: 20/25 20/25 20/50   With correction:        Advanced directives: Pt has a living will.  Smoking Status: Never Smoker Second Hand Smoking status: No smokers in home  Risk Factors Regular exercise: No. She normally does except covid closed gyms and she hasn't been back since opened back up. Diet: Pt says that she eats when she is hungry. Fall Risk: None Fall Risk  12/04/2019  Falls in the past year? 0   Opioid use history: no long term opioids use  Cardiac risk factors:  advanced age (older than 96 for men, 5 for women) yes, 64 years of age.  Hyperlipidemia-yes, but HDL >100 No diabetes. Family History: None  Depression Screen None. PHQ2 0  Depression screen [facility] 2/9 12/04/2019 03/29/2018  Decreased Interest 0 3  Down, Depressed, Hopeless 1 0  PHQ - 2 Score 1 3  Altered sleeping 0 1  Tired, decreased energy 0 0  Change in appetite 1 0  Feeling bad or failure about yourself  0 0  Trouble concentrating 0 0  Moving slowly or fidgety/restless 0 0  Suicidal thoughts 0 0  PHQ-9 Score 2 4  Difficult doing work/chores Not difficult at all Not difficult at all    Activities of Daily Living Independent ADLs and IADLs   Hearing Difficulties: -Pt says that she is a lip reader and cannot hear only due to wearing mask.   Cognitive Testing No reported trouble.     Normal 3 word recall             Mini-cog: 5/5  List the Names of Other Physician/Practitioners you currently use: -Dr. Annita Brod utd on mmg in 2020. Wnl.    Immunization History  Administered Date(s) Administered  . Influenza, High Dose Seasonal PF 06/03/2019  . Influenza-Unspecified 08/12/2019  . Pneumococcal Polysaccharide-23 10/23/2019  . Tdap 03/29/2018  . Zoster Recombinat (Shingrix) 11/15/2017, 01/27/2018   Required Immunizations needed today none   Screening tests- up to date Health Maintenance Due  Topic Date Due  . DEXA SCAN  05/16/2019    ROS- No pertinent positives discovered in course of AWV  The following were reviewed and entered/updated in epic: Past Medical History:  Diagnosis Date  . Anxiety   . History of chicken pox   . Insomnia   . Restless leg syndrome    Patient Active Problem List   Diagnosis Date Noted  . Hyperlipidemia 12/04/2019  . Osteoporosis 12/04/2019  . RLS (restless legs syndrome) 03/29/2018  . Anxiety 03/29/2018  . Insomnia 03/29/2018   Past Surgical History:  Procedure Laterality Date  . bilateral blerphoplasty     . cataract surgery     . OPEN REDUCTION INTERNAL FIXATION (ORIF) DISTAL RADIAL FRACTURE Left 03/03/2017   Procedure: OPEN REDUCTION  INTERNAL FIXATION (ORIF) LEFT DISTAL RADIUS FRACTURE;  Surgeon: Mcarthur Rossetti, MD;  Location: [facility];  Service: Orthopedics;  Laterality: Left;  . WISDOM TOOTH EXTRACTION      History reviewed. No pertinent family history.  Medications- reviewed and updated Current Outpatient Medications  Medication Sig Dispense Refill  . ALPRAZolam (XANAX) 0.5 MG tablet Take 1 tablet (0.5 mg total) by mouth at bedtime as needed for anxiety. Due for f/u in September 30 tablet 1  . FLUoxetine (PROZAC) 20 MG capsule TAKE 1 CAPSULE BY MOUTH EVERY DAY 90 capsule 1  . zolpidem (AMBIEN) 10 MG tablet Take 0.5 tablets (5 mg total) by mouth at bedtime as needed for sleep. 30 tablet 1   No  current facility-administered medications for this visit.    Allergies-reviewed and updated Allergies  Allergen Reactions  . Latex Swelling    Swelling of lips during dental procedure    Social History   Socioeconomic History  . Marital status: Single    Spouse name: Not on file  . Number of children: Not on file  . Years of education: Not on file  . Highest education level: Not on file  Occupational History  . Not on file  Tobacco Use  . Smoking status: Never Smoker  . Smokeless tobacco: Never Used  Substance and Sexual Activity  . Alcohol use: Yes    Alcohol/week: 0.0 standard drinks    Comment: wine  . Drug use: No  . Sexual activity: Not on file  Other Topics Concern  . Not on file  Social History Narrative  . Not on file   Social Determinants of Health   Financial Resource Strain:   . Difficulty of Paying Living Expenses: Not on file  Food Insecurity:   . Worried About Charity fundraiser in the Last Year: Not on file  . Ran Out of Food in the Last Year: Not on file  Transportation Needs:   . Lack of Transportation (Medical): Not on file  . Lack of Transportation (Non-Medical): Not on file  Physical Activity:   . Days of Exercise per Week: Not on file  . Minutes of Exercise per Session: Not on file  Stress:   . Feeling of Stress : Not on file  Social Connections:   . Frequency of Communication with Friends and Family: Not on file  . Frequency of Social Gatherings with Friends and Family: Not on file  . Attends Religious Services: Not on file  . Active Member of Clubs or Organizations: Not on file  . Attends Archivist Meetings: Not on file  . Marital Status: Not on file    Objective: BP 110/68   Pulse 72   Temp 98 F (36.7 C) (Temporal)   Ht 5' 6.5" (1.689 m)   Wt 123 lb 9.6 oz (56.1 kg)   LMP  (LMP Unknown)   SpO2 93%   BMI 19.65 kg/m  Gen: NAD, resting comfortably HEENT: Mucous membranes are moist. Oropharynx normal Neck: no  thyromegaly CV: RRR no murmurs rubs or gallops Lungs: CTAB no crackles, wheeze, rhonchi Abdomen: soft/nontender/nondistended/normal bowel sounds. No rebound or guarding.  Ext: no edema Skin: warm, dry Neuro: grossly normal, moves all extremities, PERRLA  Ekg: NSR with rate of 63    Assessment/Plan:  Welcome to Medicare exam completed- discussed recommended screenings anddocumented any personalized health advice and referrals for preventive counseling. See AVS as well which was given to patient.   Status of chronic or acute concerns  Insomnia- pmp website checked and verified. Doing well on medication. F/u in 6-12 months.   Hyperlipidemia -checking labs -ascvd risk has been low and HDL extremely elevated fasting today.    Future Appointments  Date Time Provider [place]  06/03/2020  1:20 PM Orma Flaming, MD [facility]   Return in about 6 months (around 06/02/2020) for insomnia/anxiety .   Lab/Order associations: Welcome to Medicare preventive visit - Plan: EKG 12-Lead  Mixed hyperlipidemia - Plan: Lipid panel, Comprehensive metabolic panel, CBC with Differential/Platelet  Age-related osteoporosis without current pathological fracture - Plan: VITAMIN D 25 Hydroxy (Vit-D Deficiency, Fractures)  No orders of the defined types were placed in this encounter.   Return precautions advised. Orma Flaming, MD

## 2019-12-04 NOTE — Patient Instructions (Addendum)
Health Maintenance  Topic Date Due  . DEXA SCAN  05/16/2019  . PAP SMEAR-Modifier  07/24/2020  . PNA vac Low Risk Adult (2 of 2 - PCV13) 10/22/2020  . MAMMOGRAM  07/10/2021  . COLONOSCOPY  03/24/2025  . TETANUS/TDAP  03/29/2028  . INFLUENZA VACCINE  Completed  . Hepatitis C Screening  Completed  . HIV Screening  Completed    Look great! F/u in 6 months for sleep/anxiety   Orma Flaming, MD [place]

## 2020-01-31 ENCOUNTER — Other Ambulatory Visit: Payer: Self-pay | Admitting: Family Medicine

## 2020-01-31 NOTE — Telephone Encounter (Signed)
Pt requesting Xanax 0.5 mg tab and Ambien 10mg   #30  1 refill  LOV: 12/04/19 NOV: 06/03/20  Approve?

## 2020-03-03 DIAGNOSIS — M810 Age-related osteoporosis without current pathological fracture: Secondary | ICD-10-CM | POA: Diagnosis not present

## 2020-03-24 DIAGNOSIS — M818 Other osteoporosis without current pathological fracture: Secondary | ICD-10-CM | POA: Diagnosis not present

## 2020-04-20 ENCOUNTER — Other Ambulatory Visit: Payer: Self-pay | Admitting: Family Medicine

## 2020-04-22 DIAGNOSIS — C44729 Squamous cell carcinoma of skin of left lower limb, including hip: Secondary | ICD-10-CM | POA: Diagnosis not present

## 2020-04-22 DIAGNOSIS — Z85828 Personal history of other malignant neoplasm of skin: Secondary | ICD-10-CM | POA: Diagnosis not present

## 2020-05-06 ENCOUNTER — Telehealth: Payer: Self-pay

## 2020-05-06 NOTE — Telephone Encounter (Signed)
Patient is requesting a Covid antibody test.

## 2020-05-06 NOTE — Telephone Encounter (Signed)
OK to order? Please advise

## 2020-05-11 ENCOUNTER — Encounter: Payer: Self-pay | Admitting: Family Medicine

## 2020-05-11 DIAGNOSIS — R059 Cough, unspecified: Secondary | ICD-10-CM

## 2020-05-11 NOTE — Telephone Encounter (Signed)
Sent my chart message.  Dr. Rogers Blocker

## 2020-05-22 ENCOUNTER — Other Ambulatory Visit: Payer: Self-pay

## 2020-05-22 ENCOUNTER — Other Ambulatory Visit: Payer: Medicare PPO

## 2020-05-22 DIAGNOSIS — R05 Cough: Secondary | ICD-10-CM | POA: Diagnosis not present

## 2020-05-22 DIAGNOSIS — R059 Cough, unspecified: Secondary | ICD-10-CM

## 2020-05-25 LAB — SARS COV-2 SEROLOGY(COVID-19)AB(IGG,IGM),IMMUNOASSAY
SARS CoV-2 AB IgG: NEGATIVE
SARS CoV-2 IgM: POSITIVE — AB

## 2020-06-03 ENCOUNTER — Encounter: Payer: Self-pay | Admitting: Family Medicine

## 2020-06-03 ENCOUNTER — Ambulatory Visit: Payer: Medicare PPO | Admitting: Family Medicine

## 2020-06-03 ENCOUNTER — Other Ambulatory Visit: Payer: Self-pay

## 2020-06-03 VITALS — BP 108/74 | HR 72 | Temp 98.6°F | Ht 66.5 in | Wt 117.6 lb

## 2020-06-03 DIAGNOSIS — R739 Hyperglycemia, unspecified: Secondary | ICD-10-CM

## 2020-06-03 DIAGNOSIS — E782 Mixed hyperlipidemia: Secondary | ICD-10-CM | POA: Diagnosis not present

## 2020-06-03 DIAGNOSIS — F419 Anxiety disorder, unspecified: Secondary | ICD-10-CM | POA: Diagnosis not present

## 2020-06-03 MED ORDER — ALPRAZOLAM 0.5 MG PO TABS: ORAL_TABLET | ORAL | 1 refills | Status: DC

## 2020-06-03 MED ORDER — ZOLPIDEM TARTRATE 10 MG PO TABS: ORAL_TABLET | ORAL | 1 refills | Status: DC

## 2020-06-03 NOTE — Patient Instructions (Signed)
1) if you test positive, I need to know right away so I can set you up with infusion. We have here at [facility] I just have to call and get you set up with them.   2) I am encouraging everyone to have a covid kit at home that includes a pulse ox and nebulizer machine. If you get covid you have these two vital things to monitor oxygen and then I do a diluted hydrogen peroxide neb. You need to get the 3% food grade hydrogen peroxide and dilute 50-50 with distilled water or saline (example 3cc hydrogen peroxide with 3 cc water) twice a day.   ONLY use this if you get covid.   Also while covid is rampant you can take vitamins for prevention then if you get covid, I increase the vitamins and start ivermectin and possibly other steroid/inhaled steroids if needed. Below is prevention protocol.   1) vitamin D3 2000IU/day 2) vitamin C: 500-1000mg twice a day 3) quercetin: 500mg daily 4) melatonin 6mg before bedtime (may cause drowsiness)  Gargle mouthwash like scope/act/crest twice a day.   Call me right away if you get covid/set up telehealth appointment so we can discuss treatment.   

## 2020-06-03 NOTE — Progress Notes (Signed)
Patient: Misty Bullock MRN: [id number] DOB: 10/14/1955 PCP: Orma Flaming, MD     Subjective:  Chief Complaint  Patient presents with  . Anxiety  . Hyperlipidemia    HPI: The patient is a 64 y.o. female who presents today for Hyperlipidemia and Anxiety. Pt is fasting for lab work.  Hyperlipidemia Here for repeat of fasting labs. She is not exercising. Labs were elevated on last check, but she has not changed much on this.   Anxiety She is currently on prozac 20mg  and xanax .5mg  prn. She has used this appropriately. She is needing refills on both of this. She has had more anxiety in her life due to family situation but is coping fine. No desire to change current management.     Review of Systems  Constitutional: Negative for chills, fatigue and fever.  HENT: Negative for dental problem, ear pain, hearing loss and trouble swallowing.   Eyes: Negative for visual disturbance.  Respiratory: Negative for cough, chest tightness and shortness of breath.   Cardiovascular: Negative for chest pain, palpitations and leg swelling.  Gastrointestinal: Negative for abdominal pain, blood in stool, diarrhea and nausea.  Endocrine: Negative for cold intolerance, polydipsia, polyphagia and polyuria.  Genitourinary: Negative for dysuria, frequency, hematuria, pelvic pain and urgency.  Musculoskeletal: Negative for arthralgias.  Skin: Negative for rash.  Neurological: Negative for dizziness and headaches.  Psychiatric/Behavioral: Negative for dysphoric mood and sleep disturbance. The patient is not nervous/anxious.     Allergies Patient is allergic to latex.  Past Medical History Patient  has a past medical history of Anxiety, History of chicken pox, Insomnia, and Restless leg syndrome.  Surgical History Patient  has a past surgical history that includes Wisdom tooth extraction; cataract surgery ; bilateral blerphoplasty ; and Open reduction internal fixation (orif) distal radial fracture  (Left, 03/03/2017).  Family History Pateint's family history is not on file.  Social History Patient  reports that she has never smoked. She has never used smokeless tobacco. She reports current alcohol use. She reports that she does not use drugs.    Objective: Vitals:   06/03/20 1359  BP: 108/74  Pulse: 72  Temp: 98.6 F (37 C)  TempSrc: Temporal  SpO2: 98%  Weight: 117 lb 9.6 oz (53.3 kg)  Height: 5' 6.5" (1.689 m)    Body mass index is 18.7 kg/m.  Physical Exam Vitals reviewed.  Constitutional:      Appearance: She is well-developed.  HENT:     Right Ear: External ear normal.     Left Ear: External ear normal.  Eyes:     Conjunctiva/sclera: Conjunctivae normal.     Pupils: Pupils are equal, round, and reactive to light.  Neck:     Thyroid: No thyromegaly.  Cardiovascular:     Rate and Rhythm: Normal rate and regular rhythm.     Heart sounds: Normal heart sounds. No murmur heard.   Pulmonary:     Effort: Pulmonary effort is normal.     Breath sounds: Normal breath sounds.  Abdominal:     General: Bowel sounds are normal. There is no distension.     Palpations: Abdomen is soft.     Tenderness: There is no abdominal tenderness.  Musculoskeletal:     Cervical back: Normal range of motion and neck supple.  Lymphadenopathy:     Cervical: No cervical adenopathy.  Skin:    General: Skin is warm and dry.     Capillary Refill: Capillary refill takes less than 2 seconds.  Findings: No rash.  Neurological:     General: No focal deficit present.     Mental Status: She is alert and oriented to person, place, and time.     Cranial Nerves: No cranial nerve deficit.     Coordination: Coordination normal.     Deep Tendon Reflexes: Reflexes normal.  Psychiatric:        Mood and Affect: Mood normal.        Behavior: Behavior normal.    GAD7: 3    Assessment/plan: 1. Mixed hyperlipidemia Repeat labs although likely not much change since no change in  exercise/lifestyle.  - CBC with Differential/Platelet; Future - CBC with Differential/Platelet - Lipid panel; Future - Comprehensive metabolic panel; Future - Lipid panel - Comprehensive metabolic panel  2. Anxiety GAD7 very well controlled. pmp website checked and filling appropriately. Continue current meds. F/u in 3 months.   3. Elevated blood sugar  - Hemoglobin A1c; Future - Hemoglobin A1c    This visit occurred during the SARS-CoV-2 public health emergency.  Safety protocols were in place, including screening questions prior to the visit, additional usage of staff PPE, and extensive cleaning of exam room while observing appropriate contact time as indicated for disinfecting solutions.     Return in about 6 months (around 12/04/2020) for medicare/lipid/anxiety f/u .    Orma Flaming, MD [place]   06/03/2020

## 2020-06-04 ENCOUNTER — Ambulatory Visit: Payer: Medicare PPO | Admitting: Family Medicine

## 2020-06-04 ENCOUNTER — Other Ambulatory Visit: Payer: Self-pay | Admitting: Family Medicine

## 2020-06-04 LAB — COMPREHENSIVE METABOLIC PANEL
AG Ratio: 1.9 (calc) (ref 1.0–2.5)
ALT: 14 U/L (ref 6–29)
AST: 34 U/L (ref 10–35)
Albumin: 4.5 g/dL (ref 3.6–5.1)
Alkaline phosphatase (APISO): 39 U/L (ref 37–153)
BUN: 8 mg/dL (ref 7–25)
CO2: 26 mmol/L (ref 20–32)
Calcium: 9.7 mg/dL (ref 8.6–10.4)
Chloride: 97 mmol/L — ABNORMAL LOW (ref 98–110)
Creat: 0.84 mg/dL (ref 0.50–0.99)
Globulin: 2.4 g/dL (calc) (ref 1.9–3.7)
Glucose, Bld: 91 mg/dL (ref 65–99)
Potassium: 5 mmol/L (ref 3.5–5.3)
Sodium: 134 mmol/L — ABNORMAL LOW (ref 135–146)
Total Bilirubin: 0.5 mg/dL (ref 0.2–1.2)
Total Protein: 6.9 g/dL (ref 6.1–8.1)

## 2020-06-04 LAB — CBC WITH DIFFERENTIAL/PLATELET
Absolute Monocytes: 566 cells/uL (ref 200–950)
Basophils Absolute: 41 cells/uL (ref 0–200)
Basophils Relative: 1 %
Eosinophils Absolute: 49 cells/uL (ref 15–500)
Eosinophils Relative: 1.2 %
HCT: 41.5 % (ref 35.0–45.0)
Hemoglobin: 14.1 g/dL (ref 11.7–15.5)
Lymphs Abs: 1107 cells/uL (ref 850–3900)
MCH: 32 pg (ref 27.0–33.0)
MCHC: 34 g/dL (ref 32.0–36.0)
MCV: 94.3 fL (ref 80.0–100.0)
MPV: 9.8 fL (ref 7.5–12.5)
Monocytes Relative: 13.8 %
Neutro Abs: 2337 cells/uL (ref 1500–7800)
Neutrophils Relative %: 57 %
Platelets: 232 10*3/uL (ref 140–400)
RBC: 4.4 10*6/uL (ref 3.80–5.10)
RDW: 12 % (ref 11.0–15.0)
Total Lymphocyte: 27 %
WBC: 4.1 10*3/uL (ref 3.8–10.8)

## 2020-06-04 LAB — HEMOGLOBIN A1C
Hgb A1c MFr Bld: 5.1 % of total Hgb (ref <5.7)
Mean Plasma Glucose: 100 (calc)
eAG (mmol/L): 5.5 (calc)

## 2020-06-04 LAB — LIPID PANEL
Cholesterol: 264 mg/dL — ABNORMAL HIGH (ref <200)
HDL: 121 mg/dL (ref >=50)
LDL Cholesterol (Calc): 129 mg/dL (calc) — ABNORMAL HIGH
Non-HDL Cholesterol (Calc): 143 mg/dL (calc) — ABNORMAL HIGH (ref <130)
Total CHOL/HDL Ratio: 2.2 (calc) (ref <5.0)
Triglycerides: 51 mg/dL (ref <150)

## 2020-06-04 NOTE — Telephone Encounter (Signed)
Pt pharmacy requesting Trazadone 50 mg tab LOV: 06/03/2020 No future visits scheduled. Per note 12/04/2019;Trial did not work.  Approve? Please Advise.

## 2020-06-05 ENCOUNTER — Other Ambulatory Visit: Payer: Self-pay | Admitting: Family Medicine

## 2020-06-05 ENCOUNTER — Encounter: Payer: Self-pay | Admitting: Family Medicine

## 2020-06-05 MED ORDER — TRAZODONE HCL 50 MG PO TABS: 25.0000 mg | ORAL_TABLET | Freq: Every evening | ORAL | 0 refills | Status: DC | PRN

## 2020-06-05 NOTE — Telephone Encounter (Signed)
Please advise 

## 2020-06-18 ENCOUNTER — Telehealth: Payer: Self-pay

## 2020-06-18 NOTE — Telephone Encounter (Signed)
Pt is unable to respond to message, but wanted to let Dr. Rogers Blocker know- She is going to monitor her cholesterol.

## 2020-08-02 ENCOUNTER — Telehealth: Payer: Self-pay | Admitting: Family Medicine

## 2020-08-03 NOTE — Telephone Encounter (Signed)
Patient states she doesn't need any, mistakenly hit refill for the medication.

## 2020-08-29 ENCOUNTER — Other Ambulatory Visit: Payer: Self-pay | Admitting: Family Medicine

## 2020-09-01 DIAGNOSIS — Z01419 Encounter for gynecological examination (general) (routine) without abnormal findings: Secondary | ICD-10-CM | POA: Diagnosis not present

## 2020-09-01 DIAGNOSIS — Z681 Body mass index (BMI) 19 or less, adult: Secondary | ICD-10-CM | POA: Diagnosis not present

## 2020-09-01 DIAGNOSIS — Z1231 Encounter for screening mammogram for malignant neoplasm of breast: Secondary | ICD-10-CM | POA: Diagnosis not present

## 2020-09-24 DIAGNOSIS — M810 Age-related osteoporosis without current pathological fracture: Secondary | ICD-10-CM | POA: Diagnosis not present

## 2020-09-29 ENCOUNTER — Other Ambulatory Visit: Payer: Self-pay | Admitting: Family Medicine

## 2020-09-29 NOTE — Telephone Encounter (Signed)
Pt is requesting Zolpidem 10 mg tab; Xanax 0.5 mg tab  LOV: 06/03/2020 No future visits scheduled Last refill 07/2020  Approve?

## 2020-10-20 DIAGNOSIS — M810 Age-related osteoporosis without current pathological fracture: Secondary | ICD-10-CM | POA: Diagnosis not present

## 2020-11-03 DIAGNOSIS — Z961 Presence of intraocular lens: Secondary | ICD-10-CM | POA: Diagnosis not present

## 2020-11-03 DIAGNOSIS — H353132 Nonexudative age-related macular degeneration, bilateral, intermediate dry stage: Secondary | ICD-10-CM | POA: Diagnosis not present

## 2020-11-03 DIAGNOSIS — H524 Presbyopia: Secondary | ICD-10-CM | POA: Diagnosis not present

## 2020-11-26 ENCOUNTER — Other Ambulatory Visit: Payer: Self-pay | Admitting: Family Medicine

## 2020-11-27 NOTE — Telephone Encounter (Signed)
Please schedule pt for 6 month Medicare/Cholesterol/Anxiety follow up.   Thank You

## 2020-11-27 NOTE — Telephone Encounter (Signed)
LVM asking patient to call back.  

## 2020-12-24 ENCOUNTER — Ambulatory Visit: Payer: Medicare PPO | Admitting: Family Medicine

## 2020-12-31 DIAGNOSIS — H353132 Nonexudative age-related macular degeneration, bilateral, intermediate dry stage: Secondary | ICD-10-CM | POA: Diagnosis not present

## 2021-01-05 ENCOUNTER — Encounter: Payer: Self-pay | Admitting: Family Medicine

## 2021-01-08 ENCOUNTER — Ambulatory Visit: Payer: Medicare PPO | Admitting: Family Medicine

## 2021-01-08 DIAGNOSIS — H353221 Exudative age-related macular degeneration, left eye, with active choroidal neovascularization: Secondary | ICD-10-CM | POA: Diagnosis not present

## 2021-01-08 DIAGNOSIS — H35371 Puckering of macula, right eye: Secondary | ICD-10-CM | POA: Diagnosis not present

## 2021-01-08 DIAGNOSIS — H353112 Nonexudative age-related macular degeneration, right eye, intermediate dry stage: Secondary | ICD-10-CM | POA: Diagnosis not present

## 2021-01-21 ENCOUNTER — Other Ambulatory Visit: Payer: Self-pay | Admitting: Family Medicine

## 2021-01-21 NOTE — Telephone Encounter (Signed)
Pt requesting Alprazolam (Xanax) 0.5 mg tab/Zolpidem 10 mg tab LOV: 06/03/2000 Next Visit: 02/01/2021  Please Advise.

## 2021-02-01 ENCOUNTER — Other Ambulatory Visit: Payer: Self-pay

## 2021-02-01 ENCOUNTER — Encounter: Payer: Self-pay | Admitting: Family Medicine

## 2021-02-01 ENCOUNTER — Ambulatory Visit: Payer: Medicare PPO | Admitting: Family Medicine

## 2021-02-01 VITALS — BP 102/60 | HR 68 | Temp 98.6°F | Ht 66.5 in | Wt 113.0 lb

## 2021-02-01 DIAGNOSIS — F419 Anxiety disorder, unspecified: Secondary | ICD-10-CM

## 2021-02-01 DIAGNOSIS — F5101 Primary insomnia: Secondary | ICD-10-CM | POA: Diagnosis not present

## 2021-02-01 DIAGNOSIS — F32A Depression, unspecified: Secondary | ICD-10-CM | POA: Diagnosis not present

## 2021-02-01 DIAGNOSIS — R2241 Localized swelling, mass and lump, right lower limb: Secondary | ICD-10-CM | POA: Diagnosis not present

## 2021-02-01 DIAGNOSIS — E782 Mixed hyperlipidemia: Secondary | ICD-10-CM | POA: Diagnosis not present

## 2021-02-01 MED ORDER — FLUOXETINE HCL 40 MG PO CAPS: 40.0000 mg | ORAL_CAPSULE | Freq: Every day | ORAL | 3 refills | Status: DC

## 2021-02-01 NOTE — Progress Notes (Signed)
Patient: Misty Bullock MRN: [id number] DOB: 1956/06/10 PCP: Orma Flaming, MD     Subjective:  Chief Complaint  Patient presents with  . Hyperlipidemia  . Anxiety    Missed 2 mnth f/u  . Hip Pain    She complains of a spot in the right side.    HPI: The patient is a 65 y.o. female who presents today for Cholesterol, Anxiety/depression and a small "bump" on her right hip.    Anxiety She is currently on prozac 20mg  and xanax .5mg  prn. She has used this appropriately. She has had more anxiety in her life due to family situation as well as depression. She is no longer in counseling due to medicare no longer covering this. She also has some complaints of depression as her son is back in drugs. She has lost 4 pounds since her last visit. Appetite is poor. She feels more down than usual. No si/hi/ah/vh. She is wondering about increasing her prozac.   Hyperlipidemia  -she is going to start exercising. [facility] at [facility]  -HDL: 121 and her LDL is 129.  -no family hx of heart attack or stroke.  -she doesn't smoke.   [name] Very well controlled on her 1/2 [place]. Takes as prescribed.   Right mass on her right hip She first noticed this months ago. She states it has not grown and doesn't hurt her. She just wants me to look at it. It's very mobile, hard nad feels like a pebble.    Review of Systems  Constitutional: Positive for unexpected weight change. Negative for chills, fatigue and fever.  HENT: Negative for congestion, dental problem, ear pain, hearing loss and trouble swallowing.   Eyes: Negative for visual disturbance.  Respiratory: Negative for cough, chest tightness and shortness of breath.   Cardiovascular: Negative for chest pain, palpitations and leg swelling.  Gastrointestinal: Negative for abdominal pain, blood in stool, diarrhea and nausea.  Endocrine: Negative for cold intolerance, polydipsia, polyphagia and polyuria.  Genitourinary: Negative for dysuria and  hematuria.  Musculoskeletal: Negative for arthralgias.  Skin: Negative for rash.  Neurological: Negative for dizziness and headaches.  Psychiatric/Behavioral: Negative for dysphoric mood, sleep disturbance and suicidal ideas. The patient is not nervous/anxious.     Allergies Patient is allergic to latex.  Past Medical History Patient  has a past medical history of Anxiety, History of chicken pox, Insomnia, and Restless leg syndrome.  Surgical History Patient  has a past surgical history that includes Wisdom tooth extraction; cataract surgery ; bilateral blerphoplasty ; and Open reduction internal fixation (orif) distal radial fracture (Left, 03/03/2017).  Family History Pateint's family history is not on file.  Social History Patient  reports that she has never smoked. She has never used smokeless tobacco. She reports current alcohol use. She reports that she does not use drugs.    Objective: Vitals:   02/01/21 1315  BP: 102/60  Pulse: 68  Temp: 98.6 F (37 C)  TempSrc: Temporal  SpO2: 95%  Weight: 113 lb (51.3 kg)  Height: 5' 6.5" (1.689 m)    Body mass index is 17.97 kg/m.  Physical Exam Vitals reviewed.  Constitutional:      Appearance: Normal appearance. She is normal weight.  HENT:     Head: Normocephalic and atraumatic.  Neck:     Vascular: No carotid bruit.  Cardiovascular:     Rate and Rhythm: Normal rate and regular rhythm.     Heart sounds: Normal heart sounds.  Pulmonary:  Effort: Pulmonary effort is normal.     Breath sounds: Normal breath sounds.  Abdominal:     General: Bowel sounds are normal.     Palpations: Abdomen is soft.  Musculoskeletal:     Cervical back: Normal range of motion and neck supple.     Comments: Right hip: pebble like mass that is movable and hard and very superficial. Less than 1cm.   Lymphadenopathy:     Cervical: No cervical adenopathy.  Skin:    Capillary Refill: Capillary refill takes less than 2 seconds.   Neurological:     General: No focal deficit present.     Mental Status: She is alert and oriented to person, place, and time.  Psychiatric:        Mood and Affect: Mood normal.        Behavior: Behavior normal.        GAD 7 : Generalized Anxiety Score 02/01/2021 06/03/2020 12/04/2019 03/29/2018  Nervous, Anxious, on Edge 1 0 0 0  Control/stop worrying 1 1 0 0  Worry too much - different things 0 1 0 0  Trouble relaxing 0 0 0 0  Restless 0 0 0 0  Easily annoyed or irritable 1 0 0 0  Afraid - awful might happen 2 1 0 1  Total GAD 7 Score 5 3 0 1  Anxiety Difficulty Not difficult at all Not difficult at all - Not difficult at all   [facility] Visit from 02/01/2021 in [place]  PHQ-9 Total Score 11       Assessment/plan: 1. Anxiety and depression GAD7 and phq9 score are mildly worse than last visit. Significant in phq9. Her son is trigger for most of this. She is on prozac 20mg  and we will increase this to 40mg /day to see if we can get her symptoms under better control before adding on more medication. She has lost 4 pounds as well and her appetite. Hopefully we can get depression under better control, but will watch this closely. Anxiety is mild. Continue with prozac and xanax prn. She is going to start exercising as well. Counseling no longer covered by medicare and she can not afford to self pay. Will watch her closely with 1 month f/u. Precautions given.   2. Mixed hyperlipidemia -she has opted to watch this. Her HDL is quite high which likely is affecting total cholesterol. Continue to work on diet and exercise and we will follow.   3. Primary insomnia pmp website checked. No refills needed. Working well for her. F/u in 6 months.   4. Hip calcification Feels like calcification with no pathological finding. Will observe. If starts to grow or bother her will need imaging. Will see how she is doing with this in a months time.    Return in about  1 month (around 03/03/2021) for f/u with Inna Tisdell for depression/anxiety .   Orma Flaming, MD [place]   02/01/2021

## 2021-02-01 NOTE — Patient Instructions (Signed)
Increased your prozac to 40mg /day. Let's see you back in 1 month to see if appetite is better, if not we can try something else.   -exercise will help depression and anxiety  -no labs today!  See you in a month!  aw

## 2021-02-08 DIAGNOSIS — H353112 Nonexudative age-related macular degeneration, right eye, intermediate dry stage: Secondary | ICD-10-CM | POA: Diagnosis not present

## 2021-02-08 DIAGNOSIS — H353221 Exudative age-related macular degeneration, left eye, with active choroidal neovascularization: Secondary | ICD-10-CM | POA: Diagnosis not present

## 2021-02-18 DIAGNOSIS — M810 Age-related osteoporosis without current pathological fracture: Secondary | ICD-10-CM | POA: Diagnosis not present

## 2021-02-19 LAB — COMPREHENSIVE METABOLIC PANEL
Calcium: 10.1 (ref 8.7–10.7)
GFR calc non Af Amer: 97

## 2021-02-19 LAB — BASIC METABOLIC PANEL: Creatinine: 0.7 (ref 0.5–1.1)

## 2021-02-23 ENCOUNTER — Other Ambulatory Visit: Payer: Self-pay | Admitting: Family Medicine

## 2021-03-03 ENCOUNTER — Ambulatory Visit: Payer: Medicare PPO | Admitting: Family Medicine

## 2021-03-03 ENCOUNTER — Other Ambulatory Visit: Payer: Self-pay

## 2021-03-03 ENCOUNTER — Encounter: Payer: Self-pay | Admitting: Family Medicine

## 2021-03-03 VITALS — BP 100/70 | HR 68 | Temp 98.5°F | Ht 66.5 in | Wt 113.6 lb

## 2021-03-03 DIAGNOSIS — F419 Anxiety disorder, unspecified: Secondary | ICD-10-CM | POA: Diagnosis not present

## 2021-03-03 DIAGNOSIS — F32A Depression, unspecified: Secondary | ICD-10-CM

## 2021-03-03 NOTE — Patient Instructions (Signed)
-  glad your increase in prozac is helping.  -you have enough of this for a year.   -f/u with dr. Jerline Pain in august (fast for this for your cholesterol/labs)   im going to miss you!!! Aw

## 2021-03-03 NOTE — Progress Notes (Signed)
Patient: Misty Bullock MRN: [id number] DOB: [birth year] PCP: Orma Flaming, MD     Subjective:  Chief Complaint  Patient presents with  . Anxiety  . Depression    HPI: The patient is a 65 y.o. female who presents today for anxiety and depression f/u after in increased her prozac to 40mg /day. She is doing much better on the increased dosage of medication. She is tolerating fine with no side effects. Her son is her biggest trigger. She was also in counseling, but cost was $175-300. She is exercising and finding herself wanting to go and exercise and liking it. No refills needed.   Review of Systems  Constitutional: Negative for chills, fatigue and fever.  HENT: Negative for dental problem, ear pain, hearing loss and trouble swallowing.   Eyes: Negative for visual disturbance.  Respiratory: Negative for cough, chest tightness and shortness of breath.   Cardiovascular: Negative for chest pain, palpitations and leg swelling.  Gastrointestinal: Negative for abdominal pain, blood in stool, diarrhea and nausea.  Endocrine: Negative for cold intolerance, polydipsia, polyphagia and polyuria.  Genitourinary: Negative for dysuria and hematuria.  Musculoskeletal: Negative for arthralgias.  Skin: Negative for rash.  Neurological: Negative for dizziness and headaches.  Psychiatric/Behavioral: Negative for dysphoric mood and sleep disturbance. The patient is not nervous/anxious.     Allergies Patient is allergic to latex.  Past Medical History Patient  has a past medical history of Anxiety, History of chicken pox, Insomnia, and Restless leg syndrome.  Surgical History Patient  has a past surgical history that includes Wisdom tooth extraction; cataract surgery ; bilateral blerphoplasty ; and Open reduction internal fixation (orif) distal radial fracture (Left, 03/03/2017).  Family History Pateint's family history is not on file.  Social History Patient  reports that she has never smoked. She  has never used smokeless tobacco. She reports current alcohol use. She reports that she does not use drugs.    Objective: Vitals:   03/03/21 1345  BP: 100/70  Pulse: 68  Temp: 98.5 F (36.9 C)  TempSrc: Temporal  SpO2: 98%  Weight: 113 lb 9.6 oz (51.5 kg)  Height: 5' 6.5" (1.689 m)    Body mass index is 18.06 kg/m.  Physical Exam Vitals reviewed.  Constitutional:      Appearance: Normal appearance. She is normal weight.  HENT:     Head: Normocephalic and atraumatic.  Pulmonary:     Effort: Pulmonary effort is normal.  Skin:    General: Skin is warm.     Capillary Refill: Capillary refill takes less than 2 seconds.  Neurological:     General: No focal deficit present.     Mental Status: She is alert and oriented to person, place, and time.  Psychiatric:        Mood and Affect: Mood normal.        Behavior: Behavior normal.        GAD 7 : Generalized Anxiety Score 03/03/2021 02/01/2021 06/03/2020 12/04/2019  Nervous, Anxious, on Edge 1 1 0 0  Control/stop worrying 1 1 1  0  Worry too much - different things 1 0 1 0  Trouble relaxing 1 0 0 0  Restless 1 0 0 0  Easily annoyed or irritable 0 1 0 0  Afraid - awful might happen 1 2 1  0  Total GAD 7 Score 6 5 3  0  Anxiety Difficulty Not difficult at all Not difficult at all Not difficult at all [facility] Visit from 03/03/2021  in [place]  PHQ-9 Total Score 4      Assessment/plan: 1. Anxiety and depression phq9 score significantly improved, GAD7 with no significant change. She is clinically doing better with increase in prozac and enjoying and looking forward to working out. Can not afford counseling. Trigger is her son and will likely always be a source of anxiety as a mom, but overall I feel like she has good coping strategies along with augmentation of medication. Continue to monitor.     Return in about 3 months (around 06/03/2021) for HLD/labs/insomnia and toc with dr.  Jerline Pain. Orma Flaming, MD [place]  03/03/2021

## 2021-03-09 DIAGNOSIS — H353221 Exudative age-related macular degeneration, left eye, with active choroidal neovascularization: Secondary | ICD-10-CM | POA: Diagnosis not present

## 2021-03-09 DIAGNOSIS — H353112 Nonexudative age-related macular degeneration, right eye, intermediate dry stage: Secondary | ICD-10-CM | POA: Diagnosis not present

## 2021-03-30 ENCOUNTER — Other Ambulatory Visit: Payer: Self-pay | Admitting: Family Medicine

## 2021-04-06 DIAGNOSIS — H35371 Puckering of macula, right eye: Secondary | ICD-10-CM | POA: Diagnosis not present

## 2021-04-06 DIAGNOSIS — H353112 Nonexudative age-related macular degeneration, right eye, intermediate dry stage: Secondary | ICD-10-CM | POA: Diagnosis not present

## 2021-04-06 DIAGNOSIS — H353221 Exudative age-related macular degeneration, left eye, with active choroidal neovascularization: Secondary | ICD-10-CM | POA: Diagnosis not present

## 2021-04-15 DIAGNOSIS — M810 Age-related osteoporosis without current pathological fracture: Secondary | ICD-10-CM | POA: Diagnosis not present

## 2021-05-04 DIAGNOSIS — H353112 Nonexudative age-related macular degeneration, right eye, intermediate dry stage: Secondary | ICD-10-CM | POA: Diagnosis not present

## 2021-05-04 DIAGNOSIS — H353221 Exudative age-related macular degeneration, left eye, with active choroidal neovascularization: Secondary | ICD-10-CM | POA: Diagnosis not present

## 2021-05-04 DIAGNOSIS — H35371 Puckering of macula, right eye: Secondary | ICD-10-CM | POA: Diagnosis not present

## 2021-05-21 ENCOUNTER — Other Ambulatory Visit: Payer: Self-pay | Admitting: Family Medicine

## 2021-05-24 ENCOUNTER — Other Ambulatory Visit: Payer: Self-pay | Admitting: Family Medicine

## 2021-05-24 NOTE — Telephone Encounter (Signed)
Pt requesting refill for Alprazolam 0.5 mg. She is scheduled to see you on 8/23.

## 2021-05-24 NOTE — Telephone Encounter (Signed)
Ok with me. Please place any necessary orders. 

## 2021-05-24 NOTE — Telephone Encounter (Signed)
Pt is scheduled to see you 8/23 for TOC.

## 2021-05-26 ENCOUNTER — Other Ambulatory Visit: Payer: Self-pay | Admitting: Family Medicine

## 2021-06-01 ENCOUNTER — Other Ambulatory Visit: Payer: Self-pay

## 2021-06-01 ENCOUNTER — Ambulatory Visit: Payer: Medicare PPO | Admitting: Family Medicine

## 2021-06-01 ENCOUNTER — Encounter: Payer: Self-pay | Admitting: Family Medicine

## 2021-06-01 VITALS — BP 107/72 | HR 83 | Temp 98.6°F | Ht 66.5 in | Wt 113.4 lb

## 2021-06-01 DIAGNOSIS — H35371 Puckering of macula, right eye: Secondary | ICD-10-CM | POA: Diagnosis not present

## 2021-06-01 DIAGNOSIS — R739 Hyperglycemia, unspecified: Secondary | ICD-10-CM

## 2021-06-01 DIAGNOSIS — E782 Mixed hyperlipidemia: Secondary | ICD-10-CM | POA: Diagnosis not present

## 2021-06-01 DIAGNOSIS — F419 Anxiety disorder, unspecified: Secondary | ICD-10-CM

## 2021-06-01 DIAGNOSIS — H353112 Nonexudative age-related macular degeneration, right eye, intermediate dry stage: Secondary | ICD-10-CM | POA: Diagnosis not present

## 2021-06-01 DIAGNOSIS — F5101 Primary insomnia: Secondary | ICD-10-CM

## 2021-06-01 DIAGNOSIS — M810 Age-related osteoporosis without current pathological fracture: Secondary | ICD-10-CM

## 2021-06-01 DIAGNOSIS — H353221 Exudative age-related macular degeneration, left eye, with active choroidal neovascularization: Secondary | ICD-10-CM | POA: Diagnosis not present

## 2021-06-01 LAB — COMPREHENSIVE METABOLIC PANEL
ALT: 10 U/L (ref 0–35)
AST: 22 U/L (ref 0–37)
Albumin: 4.2 g/dL (ref 3.5–5.2)
Alkaline Phosphatase: 42 U/L (ref 39–117)
BUN: 13 mg/dL (ref 6–23)
CO2: 29 mEq/L (ref 19–32)
Calcium: 9.8 mg/dL (ref 8.4–10.5)
Chloride: 97 mEq/L (ref 96–112)
Creatinine, Ser: 0.8 mg/dL (ref 0.40–1.20)
GFR: 76.44 mL/min (ref >60.00)
Glucose, Bld: 98 mg/dL (ref 70–99)
Potassium: 4.5 mEq/L (ref 3.5–5.1)
Sodium: 135 mEq/L (ref 135–145)
Total Bilirubin: 0.5 mg/dL (ref 0.2–1.2)
Total Protein: 7.3 g/dL (ref 6.0–8.3)

## 2021-06-01 LAB — LIPID PANEL
Cholesterol: 229 mg/dL — ABNORMAL HIGH (ref 0–200)
HDL: 113.7 mg/dL (ref >39.00)
LDL Cholesterol: 102 mg/dL — ABNORMAL HIGH (ref 0–99)
NonHDL: 115
Total CHOL/HDL Ratio: 2
Triglycerides: 66 mg/dL (ref 0.0–149.0)
VLDL: 13.2 mg/dL (ref 0.0–40.0)

## 2021-06-01 LAB — TSH: TSH: 1.16 u[IU]/mL (ref 0.35–5.50)

## 2021-06-01 LAB — CBC
HCT: 44.1 % (ref 36.0–46.0)
Hemoglobin: 14.7 g/dL (ref 12.0–15.0)
MCHC: 33.2 g/dL (ref 30.0–36.0)
MCV: 93.1 fl (ref 78.0–100.0)
Platelets: 235 10*3/uL (ref 150.0–400.0)
RBC: 4.74 Mil/uL (ref 3.87–5.11)
RDW: 13.4 % (ref 11.5–15.5)
WBC: 4.4 10*3/uL (ref 4.0–10.5)

## 2021-06-01 LAB — HEMOGLOBIN A1C: Hgb A1c MFr Bld: 5.4 % (ref 4.6–6.5)

## 2021-06-01 NOTE — Assessment & Plan Note (Signed)
on Ambien 5 mg nightly as needed.  Does not need refill today.  Tolerating well without side effects.

## 2021-06-01 NOTE — Progress Notes (Signed)
Misty Bullock is a 65 y.o. female who presents today for an office visit.  Assessment/Plan:  Chronic Problems Addressed Today: Osteoporosis Follows with GYN.  On Prolia.  Hyperlipidemia Discussed lifestyle modifications.  Check lipids.  Insomnia  on Ambien 5 mg nightly as needed.  Does not need refill today.  Tolerating well without side effects.  Anxiety On Prozac 40 mg daily and Xanax 0.5 mg daily as needed.  Tolerating both of these well.  She does not need refill today.  Follow-up in 6 months.    Subjective:  HPI:  She has no acute complaint today.   She is here to transfer of care. Previous PCP no longer works at this office.   She is doing better and tolerating her anxiety medication well with no side affects. She is exercising about 3 times a week.   PMH:  The following were reviewed and entered/updated in epic: Past Medical History:  Diagnosis Date   Anxiety    History of chicken pox    Insomnia    Restless leg syndrome    Patient Active Problem List   Diagnosis Date Noted   Hyperlipidemia 12/04/2019   Osteoporosis 12/04/2019   RLS (restless legs syndrome) 03/29/2018   Anxiety 03/29/2018   Insomnia 03/29/2018   Past Surgical History:  Procedure Laterality Date   bilateral blerphoplasty      cataract surgery      OPEN REDUCTION INTERNAL FIXATION (ORIF) DISTAL RADIAL FRACTURE Left 03/03/2017   Procedure: OPEN REDUCTION INTERNAL FIXATION (ORIF) LEFT DISTAL RADIUS FRACTURE;  Surgeon: Mcarthur Rossetti, MD;  Location: [facility];  Service: Orthopedics;  Laterality: Left;   WISDOM TOOTH EXTRACTION      History reviewed. No pertinent family history.  Medications- reviewed and updated Current Outpatient Medications  Medication Sig Dispense Refill   ALPRAZolam (XANAX) 0.5 MG tablet TAKE 1 TABLET BY MOUTH AT BEDTIME AS NEEDED FOR ANXIETY 30 tablet 0   denosumab (PROLIA) 60 MG/ML SOSY injection Prolia 60 mg/mL subcutaneous syringe  Dispense 1 prefilled  syringe to MD office for SQ injection Q 6 months     FLUoxetine (PROZAC) 40 MG capsule Take 1 capsule (40 mg total) by mouth daily. 90 capsule 3   valACYclovir (VALTREX) 500 MG tablet valacyclovir 500 mg tablet     zolpidem (AMBIEN) 10 MG tablet TAKE 1/2 TABLET BY MOUTH AT BEDTIME AS NEEDED FOR SLEEP 30 tablet 1   traZODone (DESYREL) 50 MG tablet TAKE 1/2 TO 1 TABLET BY MOUTH AT BEDTIME AS NEEDED FOR SLEEP (Patient not taking: Reported on 06/01/2021) 30 tablet 0   No current facility-administered medications for this visit.    Allergies-reviewed and updated Allergies  Allergen Reactions   Latex Swelling    Swelling of lips during dental procedure    Social History   Socioeconomic History   Marital status: Single    Spouse name: Not on file   Number of children: Not on file   Years of education: Not on file   Highest education level: Not on file  Occupational History   Not on file  Tobacco Use   Smoking status: Never   Smokeless tobacco: Never  Vaping Use   Vaping Use: Never used  Substance and Sexual Activity   Alcohol use: Yes    Alcohol/week: 0.0 standard drinks    Comment: wine   Drug use: No   Sexual activity: Not on file  Other Topics Concern   Not on file  Social History Narrative  Not on file   Social Determinants of Health   Financial Resource Strain: Not on file  Food Insecurity: Not on file  Transportation Needs: Not on file  Physical Activity: Not on file  Stress: Not on file  Social Connections: Not on file            Objective:  Physical Exam: BP 107/72   Pulse 83   Temp 98.6 F (37 C) (Temporal)   Ht 5' 6.5" (1.689 m)   Wt 113 lb 6.4 oz (51.4 kg)   LMP  (LMP Unknown)   SpO2 98%   BMI 18.03 kg/m   Gen: No acute distress, resting comfortably CV: Regular rate and rhythm with no murmurs appreciated Pulm: Normal work of breathing, clear to auscultation bilaterally with no crackles, wheezes, or rhonchi Neuro: Grossly normal, moves all  extremities Psych: Normal affect and thought content       I,Savera Zaman,acting as a scribe for Dimas Chyle, MD.,have documented all relevant documentation on the behalf of Dimas Chyle, MD,as directed by  Dimas Chyle, MD while in the presence of Dimas Chyle, MD.  I, Dimas Chyle, MD, have reviewed all documentation for this visit. The documentation on 06/01/21 for the exam, diagnosis, procedures, and orders are all accurate and complete.  Algis Greenhouse. Jerline Pain, MD 06/01/2021 2:03 PM

## 2021-06-01 NOTE — Assessment & Plan Note (Signed)
Discussed lifestyle modifications. Check lipids.  ?

## 2021-06-01 NOTE — Patient Instructions (Signed)
It was very nice to see you today!  We will check blood work today.  No other changes today.  Please let me know when the refills of any medications.  I will see you back in 6 months.  Please come back to see me sooner if needed.  Take care, Dr Jerline Pain  PLEASE NOTE:  If you had any lab tests please let us know if you have not heard back within a few days. You may see your results on mychart before we have a chance to review them but we will give you a call once they are reviewed by [place]. If we ordered any referrals today, please let us know if you have not heard from their office within the next week.   Please try these tips to maintain a healthy lifestyle:  Eat at least 3 REAL meals and 1-2 snacks per day.  Aim for no more than 5 hours between eating.  If you eat breakfast, please do so within one hour of getting up.   Each meal should contain half fruits/vegetables, one quarter protein, and one quarter carbs (no bigger than a computer mouse)  Cut down on sweet beverages. This includes juice, soda, and sweet tea.   Drink at least 1 glass of water with each meal and aim for at least 8 glasses per day  Exercise at least 150 minutes every week.

## 2021-06-01 NOTE — Assessment & Plan Note (Signed)
On Prozac 40 mg daily and Xanax 0.5 mg daily as needed.  Tolerating both of these well.  She does not need refill today.  Follow-up in 6 months.

## 2021-06-01 NOTE — Assessment & Plan Note (Signed)
Follows with GYN.  On Prolia.

## 2021-06-02 NOTE — Progress Notes (Signed)
Please inform patient of the following:  Cholesterol borderline but stable.  Everything else is normal.  Do not need to make any medication changes.  She should continue working on diet and exercise and we can recheck in year.

## 2021-06-07 ENCOUNTER — Encounter: Payer: Self-pay | Admitting: Family Medicine

## 2021-06-08 ENCOUNTER — Encounter: Payer: Medicare PPO | Admitting: Family Medicine

## 2021-06-21 ENCOUNTER — Other Ambulatory Visit: Payer: Self-pay | Admitting: Family Medicine

## 2021-06-29 DIAGNOSIS — H35371 Puckering of macula, right eye: Secondary | ICD-10-CM | POA: Diagnosis not present

## 2021-06-29 DIAGNOSIS — H353221 Exudative age-related macular degeneration, left eye, with active choroidal neovascularization: Secondary | ICD-10-CM | POA: Diagnosis not present

## 2021-06-29 DIAGNOSIS — H353112 Nonexudative age-related macular degeneration, right eye, intermediate dry stage: Secondary | ICD-10-CM | POA: Diagnosis not present

## 2021-07-19 ENCOUNTER — Other Ambulatory Visit: Payer: Self-pay | Admitting: Family Medicine

## 2021-08-06 ENCOUNTER — Other Ambulatory Visit: Payer: Self-pay

## 2021-08-06 ENCOUNTER — Ambulatory Visit (INDEPENDENT_AMBULATORY_CARE_PROVIDER_SITE_OTHER): Payer: Medicare PPO

## 2021-08-06 VITALS — BP 110/64 | HR 102 | Temp 98.2°F | Wt 112.4 lb

## 2021-08-06 DIAGNOSIS — Z0000 Encounter for general adult medical examination without abnormal findings: Secondary | ICD-10-CM

## 2021-08-06 NOTE — Progress Notes (Signed)
Virtual Visit via Telephone Note  I connected with  Rosezella Kronick Bignell on 08/06/21 at  1:00 PM EDT by telephone and verified that I am speaking with the correct person using two identifiers.  Medicare Annual Wellness visit completed telephonically due to Covid-19 pandemic.   Persons participating in this call: This Health Coach and this patient.   Location: Patient: home Provider: office   I discussed the limitations, risks, security and privacy concerns of performing an evaluation and management service by telephone and the availability of in person appointments. The patient expressed understanding and agreed to proceed.  Unable to perform video visit due to video visit attempted and failed and/or patient does not have video capability.   Some vital signs may be absent or patient reported.   Willette Brace, LPN   Subjective:   Rennee Coyne Hawkey is a 65 y.o. female who presents for an Initial Medicare Annual Wellness Visit.  Review of Systems     Cardiac Risk Factors include: advanced age (>80men, >35 women);dyslipidemia     Objective:    Today's Vitals   08/06/21 1300  BP: 110/64  Pulse: (!) 102  Temp: 98.2 F (36.8 C)  SpO2: 95%  Weight: 112 lb 6.4 oz (51 kg)   Body mass index is 17.87 kg/m.  Advanced Directives 08/06/2021 03/03/2017 03/03/2017 03/02/2017 02/27/2017 02/22/2017  Does Patient Have a Medical Advance Directive? Yes No No No Yes No  Type of Paramedic of [place];Living will [place];Living will [place];Living will Living will -  Copy of [place] in Chart? No - copy requested No - copy requested No - copy requested - - -  Would patient like information on creating a medical advance directive? - - No - Patient declined - - No - Patient declined    Current Medications (verified) Outpatient Encounter Medications as of 08/06/2021  Medication Sig   ALPRAZolam  (XANAX) 0.5 MG tablet TAKE 1 TABLET BY MOUTH AT BEDTIME AS NEEDED FOR ANXIETY   denosumab (PROLIA) 60 MG/ML SOSY injection Prolia 60 mg/mL subcutaneous syringe  Dispense 1 prefilled syringe to MD office for SQ injection Q 6 months   FLUoxetine (PROZAC) 40 MG capsule Take 1 capsule (40 mg total) by mouth daily.   valACYclovir (VALTREX) 500 MG tablet valacyclovir 500 mg tablet   zolpidem (AMBIEN) 10 MG tablet TAKE 1/2 TABLET BY MOUTH AT BEDTIME AS NEEDED FOR SLEEP   [DISCONTINUED] traZODone (DESYREL) 50 MG tablet TAKE 1/2 TO 1 TABLET BY MOUTH AT BEDTIME AS NEEDED FOR SLEEP   No facility-administered encounter medications on file as of 08/06/2021.    Allergies (verified) Latex   History: Past Medical History:  Diagnosis Date   Anxiety    History of chicken pox    Insomnia    Restless leg syndrome    Past Surgical History:  Procedure Laterality Date   bilateral blerphoplasty      cataract surgery      OPEN REDUCTION INTERNAL FIXATION (ORIF) DISTAL RADIAL FRACTURE Left 03/03/2017   Procedure: OPEN REDUCTION INTERNAL FIXATION (ORIF) LEFT DISTAL RADIUS FRACTURE;  Surgeon: Mcarthur Rossetti, MD;  Location: [facility];  Service: Orthopedics;  Laterality: Left;   WISDOM TOOTH EXTRACTION     History reviewed. No pertinent family history. Social History   Socioeconomic History   Marital status: Single    Spouse name: Not on file   Number of children: Not on file  Years of education: Not on file   Highest education level: Not on file  Occupational History   Not on file  Tobacco Use   Smoking status: Never   Smokeless tobacco: Never  Vaping Use   Vaping Use: Never used  Substance and Sexual Activity   Alcohol use: Yes    Alcohol/week: 0.0 standard drinks    Comment: wine   Drug use: No   Sexual activity: Not on file  Other Topics Concern   Not on file  Social History Narrative   Not on file   Social Determinants of Health   Financial Resource Strain: Low Risk     Difficulty of Paying Living Expenses: Not hard at all  Food Insecurity: No Food Insecurity   Worried About Charity fundraiser in the Last Year: Never true   [place] in the Last Year: Never true  Transportation Needs: No Transportation Needs   Lack of Transportation (Medical): No   Lack of Transportation (Non-Medical): No  Physical Activity: Insufficiently Active   Days of Exercise per Week: 2 days   Minutes of Exercise per Session: 60 min  Stress: Stress Concern Present   Feeling of Stress : To some extent  Social Connections: Moderately Isolated   Frequency of Communication with Friends and Family: More than three times a week   Frequency of Social Gatherings with Friends and Family: More than three times a week   Attends Religious Services: 1 to 4 times per year   Active Member of [employer] or Organizations: No   Attends Music therapist: Never   Marital Status: Never married    Tobacco Counseling Counseling given: Not Answered   Clinical Intake:  Pre-visit preparation completed: Yes  Pain : No/denies pain     BMI - recorded: 17.87 Nutritional Status: BMI <19  Underweight Nutritional Risks: None Diabetes: No  How often do you need to have someone help you when you read instructions, pamphlets, or other written materials from your doctor or pharmacy?: 1 - Never  Diabetic?no  Interpreter Needed?: No  Information entered by :: Charlott Rakes, LPN   Activities of Daily Living In your present state of health, do you have any difficulty performing the following activities: 08/06/2021  Hearing? N  Vision? N  Difficulty concentrating or making decisions? N  Walking or climbing stairs? N  Dressing or bathing? N  Doing errands, shopping? N  Preparing Food and eating ? N  Using the Toilet? N  In the past six months, have you accidently leaked urine? N  Do you have problems with loss of bowel control? N  Managing your Medications? N  Managing your  Finances? N  Housekeeping or managing your Housekeeping? N  Some recent data might be hidden    Patient Care Team: Vivi Barrack, MD as PCP - General (Family Medicine) Luberta Mutter, MD as Consulting Physician (Ophthalmology) Arvella Nigh, MD as Consulting Physician (Obstetrics and Gynecology) [place], Amy, MD as Consulting Physician (Dermatology)  Indicate any recent Medical Services you may have received from other than [facility] providers in the past year (date may be approximate).     Assessment:   This is a routine wellness examination for [place].  Hearing/Vision screen Hearing Screening - Comments:: Pt denies any hearing issues  Vision Screening - Comments:: Pt follows up with [facility] opthalmology for annual eye exams   Dietary issues and exercise activities discussed: Current Exercise Habits: Home exercise routine, Type of exercise: Other - see  comments (silver sneakers), Time (Minutes): > 60, Frequency (Times/Week): 2, Weekly Exercise (Minutes/Week): 0   Goals Addressed             This Visit's Progress    Patient Stated       None at this time       Depression Screen PHQ 2/9 Scores 08/06/2021 06/01/2021 03/03/2021 02/01/2021 12/04/2019 03/29/2018  PHQ - 2 Score 1 0 1 3 1 3   PHQ- 9 Score 3 - 4 11 2 4     Fall Risk Fall Risk  08/06/2021 06/01/2021 06/03/2020 12/04/2019  Falls in the past year? 0 0 0 0  Number falls in past yr: 0 0 - -  Injury with Fall? 0 0 - -  Risk for fall due to : - No Fall Risks No Fall Risks -  Follow up Falls prevention discussed - - -    FALL RISK PREVENTION PERTAINING TO THE HOME:  Any stairs in or around the home? Yes  If so, are there any without handrails? No  Home free of loose throw rugs in walkways, pet beds, electrical cords, etc? Yes  Adequate lighting in your home to reduce risk of falls? Yes   ASSISTIVE DEVICES UTILIZED TO PREVENT FALLS:  Life alert? No  Use of a cane, walker or w/c? No  Grab bars in the bathroom? No   Shower chair or bench in shower? No  Elevated toilet seat or a handicapped toilet? No   TIMED UP AND GO:  Was the test performed? Yes .  Length of time to ambulate 10 feet: 10 sec.   Gait steady and fast without use of assistive device  Cognitive Function:     6CIT Screen 08/06/2021  What Year? 0 points  What month? 0 points  What time? 0 points  Count back from 20 0 points  Months in reverse 0 points  Repeat phrase 0 points  Total Score 0    Immunizations Immunization History  Administered Date(s) Administered   Influenza, High Dose Seasonal PF 06/03/2019   Influenza-Unspecified 08/12/2019   PNEUMOCOCCAL CONJUGATE-20 01/03/2021   Pneumococcal Polysaccharide-23 10/23/2019   Tdap 03/29/2018   Zoster Recombinat (Shingrix) 11/15/2017, 01/27/2018    TDAP status: Up to date  Flu Vaccine status: Due, Education has been provided regarding the importance of this vaccine. Advised may receive this vaccine at local pharmacy or Health Dept. Aware to provide a copy of the vaccination record if obtained from local pharmacy or Health Dept. Verbalized acceptance and understanding.  Pneumococcal vaccine status: Up to date  Covid-19 vaccine status: Declined, Education has been provided regarding the importance of this vaccine but patient still declined. Advised may receive this vaccine at local pharmacy or Health Dept.or vaccine clinic. Aware to provide a copy of the vaccination record if obtained from local pharmacy or Health Dept. Verbalized acceptance and understanding.  Qualifies for Shingles Vaccine? Yes   Zostavax completed Yes   Shingrix Completed?: Yes  Screening Tests Health Maintenance  Topic Date Due   INFLUENZA VACCINE  05/10/2021   MAMMOGRAM  07/10/2021   COLONOSCOPY (Pts 45-25yrs Insurance coverage will need to be confirmed)  03/24/2025   TETANUS/TDAP  03/29/2028   Pneumonia Vaccine 43+ Years old  Completed   DEXA SCAN  Completed   Hepatitis C Screening  Completed    Zoster Vaccines- Shingrix  Completed   HPV VACCINES  Aged Out   COVID-19 Vaccine  Discontinued    Health Maintenance  Health Maintenance Due  Topic Date  Due   INFLUENZA VACCINE  05/10/2021   MAMMOGRAM  07/10/2021     Colorectal cancer screening: Type of screening: Colonoscopy. Completed 03/25/15. Repeat every 10 years  Mammogram status: Completed 07/11/19. Repeat every year  Bone Density status: Completed 08/13/19. Results reflect: Bone density results: OSTEOPOROSIS. Repeat every 2 years.   Additional Screening:  Hepatitis C Screening:  Completed 03/29/18  Vision Screening: Recommended annual ophthalmology exams for early detection of glaucoma and other disorders of the eye. Is the patient up to date with their annual eye exam?  Yes  Who is the provider or what is the name of the office in which the patient attends annual eye exams? [facility] opthalmology  If pt is not established with a provider, would they like to be referred to a provider to establish care? No .   Dental Screening: Recommended annual dental exams for proper oral hygiene  Community Resource Referral / Chronic Care Management: CRR required this visit?  No   CCM required this visit?  No      Plan:     I have personally reviewed and noted the following in the patient's chart:   Medical and social history Use of alcohol, tobacco or illicit drugs  Current medications and supplements including opioid prescriptions. Patient is not currently taking opioid prescriptions. Functional ability and status Nutritional status Physical activity Advanced directives List of other physicians Hospitalizations, surgeries, and ER visits in previous 12 months Vitals Screenings to include cognitive, depression, and falls Referrals and appointments  In addition, I have reviewed and discussed with patient certain preventive protocols, quality metrics, and best practice recommendations. A written personalized care plan  for preventive services as well as general preventive health recommendations were provided to patient.     Willette Brace, LPN   [id number]   Nurse Notes: None

## 2021-08-06 NOTE — Patient Instructions (Signed)
Misty Bullock , Thank you for taking time to come for your Medicare Wellness Visit. I appreciate your ongoing commitment to your health goals. Please review the following plan we discussed and let me know if I can assist you in the future.   Screening recommendations/referrals: Colonoscopy: Done 02/03/16 repeat every 10 years  Mammogram: Scheduled 09/2021 Bone Density: Done 08/13/19 repeat every 2 years  Recommended yearly ophthalmology/optometry visit for glaucoma screening and checkup Recommended yearly dental visit for hygiene and checkup  Vaccinations: Influenza vaccine: Due and discussed Pneumococcal vaccine: Up to date Tdap vaccine: Done 03/29/18 repeat every 10 years  Shingles vaccine: Completed 2/6, 01/27/18   Covid-19:declined and discussed  Advanced directives: Please bring a copy of your health care power of attorney and living will to the office at your convenience.  Conditions/risks identified: None at this time   Next appointment: Follow up in one year for your annual wellness visit    Preventive Care 65 Years and Older, Female Preventive care refers to lifestyle choices and visits with your health care provider that can promote health and wellness. What does preventive care include? A yearly physical exam. This is also called an annual well check. Dental exams once or twice a year. Routine eye exams. Ask your health care provider how often you should have your eyes checked. Personal lifestyle choices, including: Daily care of your teeth and gums. Regular physical activity. Eating a healthy diet. Avoiding tobacco and drug use. Limiting alcohol use. Practicing safe sex. Taking low-dose aspirin every day. Taking vitamin and mineral supplements as recommended by your health care provider. What happens during an annual well check? The services and screenings done by your health care provider during your annual well check will depend on your age, overall health, lifestyle risk  factors, and family history of disease. Counseling  Your health care provider may ask you questions about your: Alcohol use. Tobacco use. Drug use. Emotional well-being. Home and relationship well-being. Sexual activity. Eating habits. History of falls. Memory and ability to understand (cognition). Work and work Statistician. Reproductive health. Screening  You may have the following tests or measurements: Height, weight, and BMI. Blood pressure. Lipid and cholesterol levels. These may be checked every 5 years, or more frequently if you are over 82 years old. Skin check. Lung cancer screening. You may have this screening every year starting at age 54 if you have a 30-pack-year history of smoking and currently smoke or have quit within the past 15 years. Fecal occult blood test (FOBT) of the stool. You may have this test every year starting at age 12. Flexible sigmoidoscopy or colonoscopy. You may have a sigmoidoscopy every 5 years or a colonoscopy every 10 years starting at age 47. Hepatitis C blood test. Hepatitis B blood test. Sexually transmitted disease (STD) testing. Diabetes screening. This is done by checking your blood sugar (glucose) after you have not eaten for a while (fasting). You may have this done every 1-3 years. Bone density scan. This is done to screen for osteoporosis. You may have this done starting at age 78. Mammogram. This may be done every 1-2 years. Talk to your health care provider about how often you should have regular mammograms. Talk with your health care provider about your test results, treatment options, and if necessary, the need for more tests. Vaccines  Your health care provider may recommend certain vaccines, such as: Influenza vaccine. This is recommended every year. Tetanus, diphtheria, and acellular pertussis (Tdap, Td) vaccine. You may need  a Td booster every 10 years. Zoster vaccine. You may need this after age 30. Pneumococcal 13-valent  conjugate (PCV13) vaccine. One dose is recommended after age 15. Pneumococcal polysaccharide (PPSV23) vaccine. One dose is recommended after age 20. Talk to your health care provider about which screenings and vaccines you need and how often you need them. This information is not intended to replace advice given to you by your health care provider. Make sure you discuss any questions you have with your health care provider. Document Released: 10/23/2015 Document Revised: 06/15/2016 Document Reviewed: 07/28/2015 Elsevier Interactive Patient Education  2017 [place] Prevention in the Home Falls can cause injuries. They can happen to people of all ages. There are many things you can do to make your home safe and to help prevent falls. What can I do on the outside of my home? Regularly fix the edges of walkways and driveways and fix any cracks. Remove anything that might make you trip as you walk through a door, such as a raised step or threshold. Trim any bushes or trees on the path to your home. Use bright outdoor lighting. Clear any walking paths of anything that might make someone trip, such as rocks or tools. Regularly check to see if handrails are loose or broken. Make sure that both sides of any steps have handrails. Any raised decks and porches should have guardrails on the edges. Have any leaves, snow, or ice cleared regularly. Use sand or salt on walking paths during winter. Clean up any spills in your garage right away. This includes oil or grease spills. What can I do in the bathroom? Use night lights. Install grab bars by the toilet and in the tub and shower. Do not use towel bars as grab bars. Use non-skid mats or decals in the tub or shower. If you need to sit down in the shower, use a plastic, non-slip stool. Keep the floor dry. Clean up any water that spills on the floor as soon as it happens. Remove soap buildup in the tub or shower regularly. Attach bath mats  securely with double-sided non-slip rug tape. Do not have throw rugs and other things on the floor that can make you trip. What can I do in the bedroom? Use night lights. Make sure that you have a light by your bed that is easy to reach. Do not use any sheets or blankets that are too big for your bed. They should not hang down onto the floor. Have a firm chair that has side arms. You can use this for support while you get dressed. Do not have throw rugs and other things on the floor that can make you trip. What can I do in the kitchen? Clean up any spills right away. Avoid walking on wet floors. Keep items that you use a lot in easy-to-reach places. If you need to reach something above you, use a strong step stool that has a grab bar. Keep electrical cords out of the way. Do not use floor polish or wax that makes floors slippery. If you must use wax, use non-skid floor wax. Do not have throw rugs and other things on the floor that can make you trip. What can I do with my stairs? Do not leave any items on the stairs. Make sure that there are handrails on both sides of the stairs and use them. Fix handrails that are broken or loose. Make sure that handrails are as long as the stairways. Check  any carpeting to make sure that it is firmly attached to the stairs. Fix any carpet that is loose or worn. Avoid having throw rugs at the top or bottom of the stairs. If you do have throw rugs, attach them to the floor with carpet tape. Make sure that you have a light switch at the top of the stairs and the bottom of the stairs. If you do not have them, ask someone to add them for you. What else can I do to help prevent falls? Wear shoes that: Do not have high heels. Have rubber bottoms. Are comfortable and fit you well. Are closed at the toe. Do not wear sandals. If you use a stepladder: Make sure that it is fully opened. Do not climb a closed stepladder. Make sure that both sides of the stepladder  are locked into place. Ask someone to hold it for you, if possible. Clearly mark and make sure that you can see: Any grab bars or handrails. First and last steps. Where the edge of each step is. Use tools that help you move around (mobility aids) if they are needed. These include: Canes. Walkers. Scooters. Crutches. Turn on the lights when you go into a dark area. Replace any light bulbs as soon as they burn out. Set up your furniture so you have a clear path. Avoid moving your furniture around. If any of your floors are uneven, fix them. If there are any pets around you, be aware of where they are. Review your medicines with your doctor. Some medicines can make you feel dizzy. This can increase your chance of falling. Ask your doctor what other things that you can do to help prevent falls. This information is not intended to replace advice given to you by your health care provider. Make sure you discuss any questions you have with your health care provider. Document Released: 07/23/2009 Document Revised: 03/03/2016 Document Reviewed: 10/31/2014 Elsevier Interactive Patient Education  2017 [employer].

## 2021-09-16 ENCOUNTER — Other Ambulatory Visit: Payer: Self-pay | Admitting: Family Medicine

## 2021-09-21 LAB — HM PAP SMEAR

## 2021-10-19 DIAGNOSIS — H353112 Nonexudative age-related macular degeneration, right eye, intermediate dry stage: Secondary | ICD-10-CM | POA: Diagnosis not present

## 2021-10-19 DIAGNOSIS — H35371 Puckering of macula, right eye: Secondary | ICD-10-CM | POA: Diagnosis not present

## 2021-10-19 DIAGNOSIS — H353221 Exudative age-related macular degeneration, left eye, with active choroidal neovascularization: Secondary | ICD-10-CM | POA: Diagnosis not present

## 2021-11-05 DIAGNOSIS — H52203 Unspecified astigmatism, bilateral: Secondary | ICD-10-CM | POA: Diagnosis not present

## 2021-11-05 DIAGNOSIS — Z961 Presence of intraocular lens: Secondary | ICD-10-CM | POA: Diagnosis not present

## 2021-11-16 ENCOUNTER — Other Ambulatory Visit: Payer: Self-pay | Admitting: Family Medicine

## 2021-11-19 ENCOUNTER — Other Ambulatory Visit: Payer: Self-pay | Admitting: Family Medicine

## 2021-11-19 NOTE — Telephone Encounter (Signed)
Dr. Jerline Pain, Rx did not go through on 2/8 it said print. Please resend. Thanks

## 2021-11-24 DIAGNOSIS — M810 Age-related osteoporosis without current pathological fracture: Secondary | ICD-10-CM | POA: Diagnosis not present

## 2021-11-29 DIAGNOSIS — M810 Age-related osteoporosis without current pathological fracture: Secondary | ICD-10-CM | POA: Diagnosis not present

## 2021-11-30 DIAGNOSIS — H353221 Exudative age-related macular degeneration, left eye, with active choroidal neovascularization: Secondary | ICD-10-CM | POA: Diagnosis not present

## 2021-11-30 DIAGNOSIS — H353112 Nonexudative age-related macular degeneration, right eye, intermediate dry stage: Secondary | ICD-10-CM | POA: Diagnosis not present

## 2021-12-03 ENCOUNTER — Ambulatory Visit: Payer: Medicare PPO | Admitting: Family Medicine

## 2021-12-03 ENCOUNTER — Other Ambulatory Visit: Payer: Self-pay

## 2021-12-03 ENCOUNTER — Encounter: Payer: Self-pay | Admitting: Family Medicine

## 2021-12-03 VITALS — BP 119/78 | HR 69 | Temp 98.2°F | Ht 66.5 in | Wt 113.4 lb

## 2021-12-03 DIAGNOSIS — F419 Anxiety disorder, unspecified: Secondary | ICD-10-CM | POA: Diagnosis not present

## 2021-12-03 DIAGNOSIS — F5101 Primary insomnia: Secondary | ICD-10-CM | POA: Diagnosis not present

## 2021-12-03 DIAGNOSIS — E782 Mixed hyperlipidemia: Secondary | ICD-10-CM

## 2021-12-03 MED ORDER — ZOLPIDEM TARTRATE 10 MG PO TABS: 5.0000 mg | ORAL_TABLET | Freq: Every day | ORAL | 1 refills | Status: DC

## 2021-12-03 NOTE — Progress Notes (Signed)
° °  Misty Bullock is a 66 y.o. female who presents today for an office visit.  Assessment/Plan:  Chronic Problems Addressed Today: Anxiety On Prozac 40 mg daily and Xanax 0.5 mg daily as needed.  She is tolerating both of these and both seem to be working well.  She has been under a lot of stress due to issues with her son.  We will place referral to therapy.  Follow-up 6 months.  Insomnia On Ambien 5 mg nightly as needed.  Will refill today.    Subjective:  HPI:  See A/P for status of chronic conditions.  She is still has a lot of stress related to the addiction issues with her son.  Medications seem to be working well.  She has no acute concerns today.       Objective:  Physical Exam: BP 119/78 (BP Location: Left Arm)    Pulse 69    Temp 98.2 F (36.8 C) (Temporal)    Ht 5' 6.5" (1.689 m)    Wt 113 lb 6.4 oz (51.4 kg)    LMP  (LMP Unknown)    SpO2 99%    BMI 18.03 kg/m   Gen: No acute distress, resting comfortably CV: Regular rate and rhythm with no murmurs appreciated Pulm: Normal work of breathing, clear to auscultation bilaterally with no crackles, wheezes, or rhonchi Neuro: Grossly normal, moves all extremities Psych: Normal affect and thought content      Misty Bullock M. Jerline Pain, MD 12/03/2021 2:31 PM

## 2021-12-03 NOTE — Patient Instructions (Signed)
It was very nice to see you today!  I will refill your medications today.  We will refer you to see a therapist.  Please come back in 6 months for your annual checkup with blood work.  Come back sooner if needed.  Take care, Dr Jerline Pain  PLEASE NOTE:  If you had any lab tests please let us know if you have not heard back within a few days. You may see your results on mychart before we have a chance to review them but we will give you a call once they are reviewed by [place]. If we ordered any referrals today, please let us know if you have not heard from their office within the next week.   Please try these tips to maintain a healthy lifestyle:  Eat at least 3 REAL meals and 1-2 snacks per day.  Aim for no more than 5 hours between eating.  If you eat breakfast, please do so within one hour of getting up.   Each meal should contain half fruits/vegetables, one quarter protein, and one quarter carbs (no bigger than a computer mouse)  Cut down on sweet beverages. This includes juice, soda, and sweet tea.   Drink at least 1 glass of water with each meal and aim for at least 8 glasses per day  Exercise at least 150 minutes every week.

## 2021-12-03 NOTE — Assessment & Plan Note (Signed)
On Ambien 5 mg nightly as needed.  Will refill today.

## 2021-12-03 NOTE — Assessment & Plan Note (Signed)
On Prozac 40 mg daily and Xanax 0.5 mg daily as needed.  She is tolerating both of these and both seem to be working well.  She has been under a lot of stress due to issues with her son.  We will place referral to therapy.  Follow-up 6 months.

## 2022-01-11 ENCOUNTER — Other Ambulatory Visit: Payer: Self-pay | Admitting: Family Medicine

## 2022-01-25 DIAGNOSIS — H353221 Exudative age-related macular degeneration, left eye, with active choroidal neovascularization: Secondary | ICD-10-CM | POA: Diagnosis not present

## 2022-02-08 DIAGNOSIS — Z86010 Personal history of colonic polyps: Secondary | ICD-10-CM | POA: Diagnosis not present

## 2022-02-08 DIAGNOSIS — D175 Benign lipomatous neoplasm of intra-abdominal organs: Secondary | ICD-10-CM | POA: Diagnosis not present

## 2022-02-08 DIAGNOSIS — K635 Polyp of colon: Secondary | ICD-10-CM | POA: Diagnosis not present

## 2022-02-08 DIAGNOSIS — K644 Residual hemorrhoidal skin tags: Secondary | ICD-10-CM | POA: Diagnosis not present

## 2022-02-08 DIAGNOSIS — K648 Other hemorrhoids: Secondary | ICD-10-CM | POA: Diagnosis not present

## 2022-02-08 DIAGNOSIS — Q438 Other specified congenital malformations of intestine: Secondary | ICD-10-CM | POA: Diagnosis not present

## 2022-02-08 LAB — HM COLONOSCOPY

## 2022-02-10 DIAGNOSIS — K635 Polyp of colon: Secondary | ICD-10-CM | POA: Diagnosis not present

## 2022-02-23 ENCOUNTER — Encounter: Payer: Self-pay | Admitting: Family Medicine

## 2022-03-08 DIAGNOSIS — H35371 Puckering of macula, right eye: Secondary | ICD-10-CM | POA: Diagnosis not present

## 2022-03-08 DIAGNOSIS — H353221 Exudative age-related macular degeneration, left eye, with active choroidal neovascularization: Secondary | ICD-10-CM | POA: Diagnosis not present

## 2022-03-08 DIAGNOSIS — H353112 Nonexudative age-related macular degeneration, right eye, intermediate dry stage: Secondary | ICD-10-CM | POA: Diagnosis not present

## 2022-04-04 ENCOUNTER — Other Ambulatory Visit: Payer: Self-pay | Admitting: Family Medicine

## 2022-04-18 DIAGNOSIS — H353112 Nonexudative age-related macular degeneration, right eye, intermediate dry stage: Secondary | ICD-10-CM | POA: Diagnosis not present

## 2022-04-18 DIAGNOSIS — H35373 Puckering of macula, bilateral: Secondary | ICD-10-CM | POA: Diagnosis not present

## 2022-04-18 DIAGNOSIS — H353221 Exudative age-related macular degeneration, left eye, with active choroidal neovascularization: Secondary | ICD-10-CM | POA: Diagnosis not present

## 2022-05-30 DIAGNOSIS — H353221 Exudative age-related macular degeneration, left eye, with active choroidal neovascularization: Secondary | ICD-10-CM | POA: Diagnosis not present

## 2022-05-30 DIAGNOSIS — H353112 Nonexudative age-related macular degeneration, right eye, intermediate dry stage: Secondary | ICD-10-CM | POA: Diagnosis not present

## 2022-05-30 DIAGNOSIS — M810 Age-related osteoporosis without current pathological fracture: Secondary | ICD-10-CM | POA: Diagnosis not present

## 2022-06-03 ENCOUNTER — Telehealth: Payer: Self-pay | Admitting: *Deleted

## 2022-06-03 ENCOUNTER — Ambulatory Visit: Payer: Medicare PPO | Admitting: Family Medicine

## 2022-06-03 ENCOUNTER — Encounter: Payer: Self-pay | Admitting: Family Medicine

## 2022-06-03 VITALS — BP 130/86 | HR 71 | Temp 98.1°F | Ht 66.5 in | Wt 113.2 lb

## 2022-06-03 DIAGNOSIS — E559 Vitamin D deficiency, unspecified: Secondary | ICD-10-CM

## 2022-06-03 DIAGNOSIS — E782 Mixed hyperlipidemia: Secondary | ICD-10-CM

## 2022-06-03 DIAGNOSIS — R739 Hyperglycemia, unspecified: Secondary | ICD-10-CM

## 2022-06-03 DIAGNOSIS — F419 Anxiety disorder, unspecified: Secondary | ICD-10-CM

## 2022-06-03 DIAGNOSIS — Z79899 Other long term (current) drug therapy: Secondary | ICD-10-CM | POA: Diagnosis not present

## 2022-06-03 DIAGNOSIS — F5101 Primary insomnia: Secondary | ICD-10-CM

## 2022-06-03 DIAGNOSIS — E538 Deficiency of other specified B group vitamins: Secondary | ICD-10-CM

## 2022-06-03 LAB — CBC
HCT: 47 % — ABNORMAL HIGH (ref 36.0–46.0)
Hemoglobin: 15.7 g/dL — ABNORMAL HIGH (ref 12.0–15.0)
MCHC: 33.4 g/dL (ref 30.0–36.0)
MCV: 95.2 fl (ref 78.0–100.0)
Platelets: 250 10*3/uL (ref 150.0–400.0)
RBC: 4.93 Mil/uL (ref 3.87–5.11)
RDW: 13.7 % (ref 11.5–15.5)
WBC: 5.5 10*3/uL (ref 4.0–10.5)

## 2022-06-03 LAB — LIPID PANEL
Cholesterol: 266 mg/dL — ABNORMAL HIGH (ref 0–200)
HDL: 127 mg/dL (ref >39.00)
LDL Cholesterol: 128 mg/dL — ABNORMAL HIGH (ref 0–99)
NonHDL: 139.14
Total CHOL/HDL Ratio: 2
Triglycerides: 55 mg/dL (ref 0.0–149.0)
VLDL: 11 mg/dL (ref 0.0–40.0)

## 2022-06-03 LAB — COMPREHENSIVE METABOLIC PANEL
ALT: 13 U/L (ref 0–35)
AST: 27 U/L (ref 0–37)
Albumin: 4.4 g/dL (ref 3.5–5.2)
Alkaline Phosphatase: 48 U/L (ref 39–117)
BUN: 10 mg/dL (ref 6–23)
CO2: 29 mEq/L (ref 19–32)
Calcium: 9.8 mg/dL (ref 8.4–10.5)
Chloride: 93 mEq/L — ABNORMAL LOW (ref 96–112)
Creatinine, Ser: 0.83 mg/dL (ref 0.40–1.20)
GFR: 72.62 mL/min (ref >60.00)
Glucose, Bld: 86 mg/dL (ref 70–99)
Potassium: 5.1 mEq/L (ref 3.5–5.1)
Sodium: 133 mEq/L — ABNORMAL LOW (ref 135–145)
Total Bilirubin: 0.8 mg/dL (ref 0.2–1.2)
Total Protein: 7.1 g/dL (ref 6.0–8.3)

## 2022-06-03 LAB — TSH: TSH: 1.28 u[IU]/mL (ref 0.35–5.50)

## 2022-06-03 LAB — HEMOGLOBIN A1C: Hgb A1c MFr Bld: 5.4 % (ref 4.6–6.5)

## 2022-06-03 LAB — VITAMIN D 25 HYDROXY (VIT D DEFICIENCY, FRACTURES): VITD: 115.98 ng/mL (ref 30.00–100.00)

## 2022-06-03 LAB — VITAMIN B12: Vitamin B-12: 106 pg/mL — ABNORMAL LOW (ref 211–911)

## 2022-06-03 MED ORDER — ALPRAZOLAM 0.5 MG PO TABS
0.5000 mg | ORAL_TABLET | Freq: Every evening | ORAL | 5 refills | Status: DC | PRN
Start: 2022-06-03 — End: 2022-12-16

## 2022-06-03 MED ORDER — FLUOXETINE HCL 40 MG PO CAPS
40.0000 mg | ORAL_CAPSULE | Freq: Every day | ORAL | 3 refills | Status: DC
Start: 2022-06-03 — End: 2023-06-19

## 2022-06-03 MED ORDER — ZOLPIDEM TARTRATE 10 MG PO TABS
ORAL_TABLET | ORAL | 5 refills | Status: DC
Start: 2022-06-03 — End: 2022-07-14

## 2022-06-03 NOTE — Assessment & Plan Note (Signed)
On Ambien 5 mg nightly as needed.  Refill today.

## 2022-06-03 NOTE — Assessment & Plan Note (Signed)
Check labs.  Discussed lifestyle modifications. 

## 2022-06-03 NOTE — Assessment & Plan Note (Signed)
She has been under a lot of stress recently.  Symptoms are relatively well controlled though still has significant amount of symptoms.  We will continue current regimen Prozac 40 mg daily and Xanax 0.5 mg daily as needed.  Both these seem to be working well.  We will place referral for her to see a therapist.  We referred her at her last visit however she was unable to follow-up.  Follow-up in 6 months.

## 2022-06-03 NOTE — Telephone Encounter (Signed)
Hope from [facility] lab called to give critical lab: Vitamin D is high at 115.98. PCP out of the office, spoke with Dr. Randol Kern concerning lab and he advised patient to stop taking vitamin d if she was taking it and if she started feeling sick to go to ED. Spoke with patient and advised of Dr. Dennard Nip recommendations and patient verbalized understanding. Patient stated that she would stop taking vitamin d.

## 2022-06-03 NOTE — Progress Notes (Signed)
   Lynlee Stratton Boulais is a 66 y.o. female who presents today for an office visit.  Assessment/Plan:  Chronic Problems Addressed Today: Anxiety She has been under a lot of stress recently.  Symptoms are relatively well controlled though still has significant amount of symptoms.  We will continue current regimen Prozac 40 mg daily and Xanax 0.5 mg daily as needed.  Both these seem to be working well.  We will place referral for her to see a therapist.  We referred her at her last visit however she was unable to follow-up.  Follow-up in 6 months.  Insomnia On Ambien 5 mg nightly as needed.  Refill today.  Hyperlipidemia Check labs.  Discussed lifestyle modifications.    Subjective:  HPI:  See A/p for status of chronic conditions.         Objective:  Physical Exam: BP 130/86   Pulse 71   Temp 98.1 F (36.7 C) (Temporal)   Ht 5' 6.5" (1.689 m)   Wt 113 lb 3.2 oz (51.3 kg)   LMP  (LMP Unknown)   SpO2 95%   BMI 18.00 kg/m   Gen: No acute distress, resting comfortably CV: Regular rate and rhythm with no murmurs appreciated Pulm: Normal work of breathing, clear to auscultation bilaterally with no crackles, wheezes, or rhonchi Neuro: Grossly normal, moves all extremities Psych: Normal affect and thought content      Shawne Eskelson M. Jerline Pain, MD 06/03/2022 1:55 PM

## 2022-06-03 NOTE — Patient Instructions (Signed)
It was very nice to see you today!  I will refill your medications today.  We will refer you to see a therapist.  We will check blood work today.  Please try taking a multivitamin to see if this helps with the hair thinning.  Take care, Dr Jerline Pain  PLEASE NOTE:  If you had any lab tests please let us know if you have not heard back within a few days. You may see your results on mychart before we have a chance to review them but we will give you a call once they are reviewed by [place]. If we ordered any referrals today, please let us know if you have not heard from their office within the next week.   Please try these tips to maintain a healthy lifestyle:  Eat at least 3 REAL meals and 1-2 snacks per day.  Aim for no more than 5 hours between eating.  If you eat breakfast, please do so within one hour of getting up.   Each meal should contain half fruits/vegetables, one quarter protein, and one quarter carbs (no bigger than a computer mouse)  Cut down on sweet beverages. This includes juice, soda, and sweet tea.   Drink at least 1 glass of water with each meal and aim for at least 8 glasses per day  Exercise at least 150 minutes every week.

## 2022-06-07 DIAGNOSIS — M810 Age-related osteoporosis without current pathological fracture: Secondary | ICD-10-CM | POA: Diagnosis not present

## 2022-06-08 NOTE — Progress Notes (Signed)
Please inform patient of the following:  Her Vitamin D is elevated.  Recommend holding vitamin D for now.  We should recheck in 3 months.  Her B12 is low.  Recommend she start B12 protocol here or start taking oral supplement 1000 mcg daily.  We can recheck this again in 3 months.  Her "bad" cholesterol is borderline elevated but her good cholesterol is excellent.  Rest of her labs are all stable and we can recheck in a year.

## 2022-07-04 ENCOUNTER — Encounter: Payer: Self-pay | Admitting: Family Medicine

## 2022-07-11 DIAGNOSIS — H353221 Exudative age-related macular degeneration, left eye, with active choroidal neovascularization: Secondary | ICD-10-CM | POA: Diagnosis not present

## 2022-07-11 DIAGNOSIS — H353112 Nonexudative age-related macular degeneration, right eye, intermediate dry stage: Secondary | ICD-10-CM | POA: Diagnosis not present

## 2022-07-11 DIAGNOSIS — H35373 Puckering of macula, bilateral: Secondary | ICD-10-CM | POA: Diagnosis not present

## 2022-07-12 ENCOUNTER — Other Ambulatory Visit: Payer: Self-pay | Admitting: Family Medicine

## 2022-07-20 DIAGNOSIS — Z85828 Personal history of other malignant neoplasm of skin: Secondary | ICD-10-CM | POA: Diagnosis not present

## 2022-07-20 DIAGNOSIS — L570 Actinic keratosis: Secondary | ICD-10-CM | POA: Diagnosis not present

## 2022-07-20 DIAGNOSIS — L700 Acne vulgaris: Secondary | ICD-10-CM | POA: Diagnosis not present

## 2022-07-20 DIAGNOSIS — C44529 Squamous cell carcinoma of skin of other part of trunk: Secondary | ICD-10-CM | POA: Diagnosis not present

## 2022-08-12 DIAGNOSIS — S60222A Contusion of left hand, initial encounter: Secondary | ICD-10-CM | POA: Diagnosis not present

## 2022-08-12 DIAGNOSIS — S63502A Unspecified sprain of left wrist, initial encounter: Secondary | ICD-10-CM | POA: Diagnosis not present

## 2022-08-15 ENCOUNTER — Ambulatory Visit: Payer: Medicare PPO | Admitting: Orthopaedic Surgery

## 2022-08-19 ENCOUNTER — Ambulatory Visit (INDEPENDENT_AMBULATORY_CARE_PROVIDER_SITE_OTHER): Payer: Medicare PPO

## 2022-08-19 VITALS — BP 118/70 | HR 75 | Temp 98.1°F | Wt 112.6 lb

## 2022-08-19 DIAGNOSIS — Z0000 Encounter for general adult medical examination without abnormal findings: Secondary | ICD-10-CM | POA: Diagnosis not present

## 2022-08-19 NOTE — Progress Notes (Signed)
Subjective:   Misty Bullock is a 66 y.o. female who presents for Medicare Annual (Subsequent) preventive examination.  Review of Systems     Cardiac Risk Factors include: advanced age (>76mn, >>68women);dyslipidemia     Objective:    Today's Vitals   08/19/22 1340  BP: 118/70  Pulse: 75  Temp: 98.1 F (36.7 C)  SpO2: 95%  Weight: 112 lb 9.6 oz (51.1 kg)   Body mass index is 17.9 kg/m.     08/19/2022    1:59 PM 08/06/2021    1:08 PM 03/03/2017    2:06 PM 03/03/2017    9:37 AM 03/02/2017    1:09 PM 02/27/2017    3:22 AM 02/22/2017    2:47 AM  Advanced Directives  Does Patient Have a Medical Advance Directive? Yes Yes No No No Yes No  Type of AParamed[place]of [place] will Healthcare Power of [place]Living will H[place]Living will [place]Living will Living will   Copy of H[place]wellin Chart? No - copy requested No - copy requested No - copy requested No - copy requested     Would patient like information on creating a medical advance directive?    No - Patient declined   No - Patient declined    Current Medications (verified) Outpatient Encounter Medications as of 08/19/2022  Medication Sig   ALPRAZolam (XANAX) 0.5 MG tablet Take 1 tablet (0.5 mg total) by mouth at bedtime as needed for anxiety.   denosumab (PROLIA) 60 MG/ML SOSY injection Prolia 60 mg/mL subcutaneous syringe  Dispense 1 prefilled syringe to MD office for SQ injection Q 6 months   FLUoxetine (PROZAC) 40 MG capsule Take 1 capsule (40 mg total) by mouth daily.   valACYclovir (VALTREX) 500 MG tablet valacyclovir 500 mg tablet   zolpidem (AMBIEN) 10 MG tablet TAKE 0.5 TABLETS BY MOUTH AT BEDTIME.   No facility-administered encounter medications on file as of 08/19/2022.    Allergies (verified) Latex   History: Past Medical History:  Diagnosis Date   Anxiety    History of chicken pox     Insomnia    Restless leg syndrome    Past Surgical History:  Procedure Laterality Date   bilateral blerphoplasty      cataract surgery      OPEN REDUCTION INTERNAL FIXATION (ORIF) DISTAL RADIAL FRACTURE Left 03/03/2017   Procedure: OPEN REDUCTION INTERNAL FIXATION (ORIF) LEFT DISTAL RADIUS FRACTURE;  Surgeon: BMcarthur Rossetti MD;  Location: [facility];  Service: Orthopedics;  Laterality: Left;   WISDOM TOOTH EXTRACTION     History reviewed. No pertinent family history. Social History   Socioeconomic History   Marital status: Single    Spouse name: Not on file   Number of children: Not on file   Years of education: Not on file   Highest education level: Not on file  Occupational History   Not on file  Tobacco Use   Smoking status: Never   Smokeless tobacco: Never  Vaping Use   Vaping Use: Never used  Substance and Sexual Activity   Alcohol use: Yes    Alcohol/week: 0.0 standard drinks of alcohol    Comment: wine   Drug use: No   Sexual activity: Not on file  Other Topics Concern   Not on file  Social History Narrative   Not on file   Social Determinants of Health   Financial Resource Strain: Low Risk  (  08/19/2022)   Overall Financial Resource Strain (CARDIA)    Difficulty of Paying Living Expenses: Not hard at all  Food Insecurity: No Food Insecurity (08/19/2022)   Hunger Vital Sign    Worried About Running Out of Food in the Last Year: Never true    Ran Out of Food in the Last Year: Never true  Transportation Needs: No Transportation Needs (08/19/2022)   PRAPARE - Hydrologist (Medical): No    Lack of Transportation (Non-Medical): No  Physical Activity: Inactive (08/19/2022)   Exercise Vital Sign    Days of Exercise per Week: 0 days    Minutes of Exercise per Session: 0 min  Stress: Stress Concern Present (08/19/2022)   [place]    Feeling of Stress : To some  extent  Social Connections: Socially Isolated (08/19/2022)   Social Connection and Isolation Panel [NHANES]    Frequency of Communication with Friends and Family: More than three times a week    Frequency of Social Gatherings with Friends and Family: More than three times a week    Attends Religious Services: Never    Marine scientist or Organizations: No    Attends Music therapist: Never    Marital Status: Divorced    Tobacco Counseling Counseling given: Not Answered   Clinical Intake:  Pre-visit preparation completed: Yes  Pain : No/denies pain     BMI - recorded: 17.9 Nutritional Status: BMI <19  Underweight Nutritional Risks: None Diabetes: No  How often do you need to have someone help you when you read instructions, pamphlets, or other written materials from your doctor or pharmacy?: 1 - Never  Diabetic?no  Interpreter Needed?: No  Information entered by :: Misty Rakes, LPN   Activities of Daily Living    08/19/2022    2:01 PM  In your present state of health, do you have any difficulty performing the following activities:  Hearing? 0  Vision? 0  Difficulty concentrating or making decisions? 0  Walking or climbing stairs? 0  Dressing or bathing? 0  Doing errands, shopping? 0  Preparing Food and eating ? N  Using the Toilet? N  In the past six months, have you accidently leaked urine? N  Do you have problems with loss of bowel control? N  Managing your Medications? N  Managing your Finances? N  Housekeeping or managing your Housekeeping? N    Patient Care Team: Vivi Barrack, MD as PCP - General (Family Medicine) Luberta Mutter, MD as Consulting Physician (Ophthalmology) Arvella Nigh, MD as Consulting Physician (Obstetrics and Gynecology) Martinique, Amy, MD as Consulting Physician (Dermatology)  Indicate any recent Medical Services you may have received from other than [facility] providers in the past year (date may be  approximate).     Assessment:   This is a routine wellness examination for Misty Bullock.  Hearing/Vision screen Hearing Screening - Comments:: Pt denies any hearing issues  Vision Screening - Comments:: Pt follows up with Dr Ellie Lunch for annul eye exams   Dietary issues and exercise activities discussed: Current Exercise Habits: The patient does not participate in regular exercise at present   Goals Addressed             This Visit's Progress    Patient Stated       Get back to exercising        Depression Screen    08/19/2022    1:49 PM 06/03/2022  2:15 PM 08/06/2021    1:06 PM 06/01/2021    1:36 PM 03/03/2021    2:14 PM 02/01/2021    2:20 PM 12/04/2019   10:43 AM  PHQ 2/9 Scores  PHQ - 2 Score 0 2 1 0 '1 3 1  '$ PHQ- 9 Score  '6 3  4 11 2    '$ Fall Risk    08/19/2022    2:00 PM 06/03/2022    1:23 PM 08/06/2021    1:09 PM 06/01/2021    1:37 PM 06/03/2020    2:04 PM  Fall Risk   Falls in the past year? 1 0 0 0 0  Number falls in past yr: 1 0 0 0   Injury with Fall? 1 0 0 0   Comment left wrist      Risk for fall due to :  No Fall Risks  No Fall Risks No Fall Risks  Follow up Falls prevention discussed  Falls prevention discussed      FALL RISK PREVENTION PERTAINING TO THE HOME:  Any stairs in or around the home? Yes  If so, are there any without handrails? No  Home free of loose throw rugs in walkways, pet beds, electrical cords, etc? Yes  Adequate lighting in your home to reduce risk of falls? Yes   ASSISTIVE DEVICES UTILIZED TO PREVENT FALLS:  Life alert? No  Use of a cane, walker or w/c? No  Grab bars in the bathroom? No  Shower chair or bench in shower? No  Elevated toilet seat or a handicapped toilet? No   TIMED UP AND GO: 118 10 Was the test performed? Yes .  Length of time to ambulate 10 feet: 10 sec.   Gait steady and fast without use of assistive device  Cognitive Function:        08/19/2022    2:02 PM 08/06/2021    1:11 PM  6CIT Screen  What  Year? 0 points 0 points  What month? 0 points 0 points  What time? 0 points 0 points  Count back from 20 0 points 0 points  Months in reverse 0 points 0 points  Repeat phrase 0 points 0 points  Total Score 0 points 0 points    Immunizations Immunization History  Administered Date(s) Administered   Influenza, High Dose Seasonal PF 06/03/2019   Influenza-Unspecified 08/12/2019   PNEUMOCOCCAL CONJUGATE-20 01/03/2021   Pneumococcal Polysaccharide-23 10/23/2019   Tdap 03/29/2018   Zoster Recombinat (Shingrix) 11/15/2017, 01/27/2018    TDAP status: Up to date  Flu Vaccine status: Due, Education has been provided regarding the importance of this vaccine. Advised may receive this vaccine at local pharmacy or Health Dept. Aware to provide a copy of the vaccination record if obtained from local pharmacy or Health Dept. Verbalized acceptance and understanding.  Pneumococcal vaccine status: Up to date  Covid-19 vaccine status: Completed vaccines  Qualifies for Shingles Vaccine? Yes   Zostavax completed Yes   Shingrix Completed?: Yes  Screening Tests Health Maintenance  Topic Date Due   MAMMOGRAM  07/10/2021   Medicare Annual Wellness (AWV)  08/20/2023   COLONOSCOPY (Pts 45-69yr Insurance coverage will need to be confirmed)  03/01/2027   TETANUS/TDAP  03/29/2028   Pneumonia Vaccine 68 Years old  Completed   DEXA SCAN  Completed   Hepatitis C Screening  Completed   Zoster Vaccines- Shingrix  Completed   HPV VACCINES  Aged Out   INFLUENZA VACCINE  Discontinued   COVID-19 Vaccine  Discontinued  Health Maintenance  Health Maintenance Due  Topic Date Due   MAMMOGRAM  07/10/2021    Colorectal cancer screening: Type of screening: Colonoscopy. Completed 02/28/22. Repeat every 5 years  Mammogram pt stated 11/2021  Bone Density status: Completed 08/13/19. Results reflect: Bone density results: OSTEOPOROSIS. Repeat every 2 years.   Additional Screening:  Hepatitis C  Screening: Completed 03/29/18  Vision Screening: Recommended annual ophthalmology exams for early detection of glaucoma and other disorders of the eye. Is the patient up to date with their annual eye exam?  Yes  Who is the provider or what is the name of the office in which the patient attends annual eye exams? Dr Ellie Lunch  If pt is not established with a provider, would they like to be referred to a provider to establish care? No .   Dental Screening: Recommended annual dental exams for proper oral hygiene  Community Resource Referral / Chronic Care Management: CRR required this visit?  No   CCM required this visit?  No      Plan:     I have personally reviewed and noted the following in the patient's chart:   Medical and social history Use of alcohol, tobacco or illicit drugs  Current medications and supplements including opioid prescriptions. Patient is not currently taking opioid prescriptions. Functional ability and status Nutritional status Physical activity Advanced directives List of other physicians Hospitalizations, surgeries, and ER visits in previous 12 months Vitals Screenings to include cognitive, depression, and falls Referrals and appointments  In addition, I have reviewed and discussed with patient certain preventive protocols, quality metrics, and best practice recommendations. A written personalized care plan for preventive services as well as general preventive health recommendations were provided to patient.     Willette Brace, LPN   54/27/0623   Nurse Notes: none

## 2022-08-19 NOTE — Patient Instructions (Signed)
[name] , Thank you for taking time to come for your Medicare Wellness Visit. I appreciate your ongoing commitment to your health goals. Please review the following plan we discussed and let me know if I can assist you in the future.   These are the goals we discussed:  Goals      Patient Stated     None at this time        This is a list of the screening recommended for you and due dates:  Health Maintenance  Topic Date Due   Mammogram  07/10/2021   Medicare Annual Wellness Visit  08/20/2023   Colon Cancer Screening  03/01/2027   Tetanus Vaccine  03/29/2028   Pneumonia Vaccine  Completed   DEXA scan (bone density measurement)  Completed   Hepatitis C Screening: USPSTF Recommendation to screen - Ages 6-79 yo.  Completed   Zoster (Shingles) Vaccine  Completed   HPV Vaccine  Aged Out   Flu Shot  Discontinued   COVID-19 Vaccine  Discontinued    Advanced directives: Please bring a copy of your health care power of attorney and living will to the office at your convenience.  Conditions/risks identified: get back to exercise   Next appointment: Follow up in one year for your annual wellness visit    Preventive Care 65 Years and Older, Female Preventive care refers to lifestyle choices and visits with your health care provider that can promote health and wellness. What does preventive care include? A yearly physical exam. This is also called an annual well check. Dental exams once or twice a year. Routine eye exams. Ask your health care provider how often you should have your eyes checked. Personal lifestyle choices, including: Daily care of your teeth and gums. Regular physical activity. Eating a healthy diet. Avoiding tobacco and drug use. Limiting alcohol use. Practicing safe sex. Taking low-dose aspirin every day. Taking vitamin and mineral supplements as recommended by your health care provider. What happens during an annual well check? The services and screenings  done by your health care provider during your annual well check will depend on your age, overall health, lifestyle risk factors, and family history of disease. Counseling  Your health care provider may ask you questions about your: Alcohol use. Tobacco use. Drug use. Emotional well-being. Home and relationship well-being. Sexual activity. Eating habits. History of falls. Memory and ability to understand (cognition). Work and work Statistician. Reproductive health. Screening  You may have the following tests or measurements: Height, weight, and BMI. Blood pressure. Lipid and cholesterol levels. These may be checked every 5 years, or more frequently if you are over 68 years old. Skin check. Lung cancer screening. You may have this screening every year starting at age 33 if you have a 30-pack-year history of smoking and currently smoke or have quit within the past 15 years. Fecal occult blood test (FOBT) of the stool. You may have this test every year starting at age 81. Flexible sigmoidoscopy or colonoscopy. You may have a sigmoidoscopy every 5 years or a colonoscopy every 10 years starting at age 44. Hepatitis C blood test. Hepatitis B blood test. Sexually transmitted disease (STD) testing. Diabetes screening. This is done by checking your blood sugar (glucose) after you have not eaten for a while (fasting). You may have this done every 1-3 years. Bone density scan. This is done to screen for osteoporosis. You may have this done starting at age 29. Mammogram. This may be done every 1-2 years.  Talk to your health care provider about how often you should have regular mammograms. Talk with your health care provider about your test results, treatment options, and if necessary, the need for more tests. Vaccines  Your health care provider may recommend certain vaccines, such as: Influenza vaccine. This is recommended every year. Tetanus, diphtheria, and acellular pertussis (Tdap, Td)  vaccine. You may need a Td booster every 10 years. Zoster vaccine. You may need this after age 19. Pneumococcal 13-valent conjugate (PCV13) vaccine. One dose is recommended after age 48. Pneumococcal polysaccharide (PPSV23) vaccine. One dose is recommended after age 13. Talk to your health care provider about which screenings and vaccines you need and how often you need them. This information is not intended to replace advice given to you by your health care provider. Make sure you discuss any questions you have with your health care provider. Document Released: 10/23/2015 Document Revised: 06/15/2016 Document Reviewed: 07/28/2015 Elsevier Interactive Patient Education  2017 [place] Prevention in the Home Falls can cause injuries. They can happen to people of all ages. There are many things you can do to make your home safe and to help prevent falls. What can I do on the outside of my home? Regularly fix the edges of walkways and driveways and fix any cracks. Remove anything that might make you trip as you walk through a door, such as a raised step or threshold. Trim any bushes or trees on the path to your home. Use bright outdoor lighting. Clear any walking paths of anything that might make someone trip, such as rocks or tools. Regularly check to see if handrails are loose or broken. Make sure that both sides of any steps have handrails. Any raised decks and porches should have guardrails on the edges. Have any leaves, snow, or ice cleared regularly. Use sand or salt on walking paths during winter. Clean up any spills in your garage right away. This includes oil or grease spills. What can I do in the bathroom? Use night lights. Install grab bars by the toilet and in the tub and shower. Do not use towel bars as grab bars. Use non-skid mats or decals in the tub or shower. If you need to sit down in the shower, use a plastic, non-slip stool. Keep the floor dry. Clean up any  water that spills on the floor as soon as it happens. Remove soap buildup in the tub or shower regularly. Attach bath mats securely with double-sided non-slip rug tape. Do not have throw rugs and other things on the floor that can make you trip. What can I do in the bedroom? Use night lights. Make sure that you have a light by your bed that is easy to reach. Do not use any sheets or blankets that are too big for your bed. They should not hang down onto the floor. Have a firm chair that has side arms. You can use this for support while you get dressed. Do not have throw rugs and other things on the floor that can make you trip. What can I do in the kitchen? Clean up any spills right away. Avoid walking on wet floors. Keep items that you use a lot in easy-to-reach places. If you need to reach something above you, use a strong step stool that has a grab bar. Keep electrical cords out of the way. Do not use floor polish or wax that makes floors slippery. If you must use wax, use non-skid floor wax.  Do not have throw rugs and other things on the floor that can make you trip. What can I do with my stairs? Do not leave any items on the stairs. Make sure that there are handrails on both sides of the stairs and use them. Fix handrails that are broken or loose. Make sure that handrails are as long as the stairways. Check any carpeting to make sure that it is firmly attached to the stairs. Fix any carpet that is loose or worn. Avoid having throw rugs at the top or bottom of the stairs. If you do have throw rugs, attach them to the floor with carpet tape. Make sure that you have a light switch at the top of the stairs and the bottom of the stairs. If you do not have them, ask someone to add them for you. What else can I do to help prevent falls? Wear shoes that: Do not have high heels. Have rubber bottoms. Are comfortable and fit you well. Are closed at the toe. Do not wear sandals. If you use a  stepladder: Make sure that it is fully opened. Do not climb a closed stepladder. Make sure that both sides of the stepladder are locked into place. Ask someone to hold it for you, if possible. Clearly mark and make sure that you can see: Any grab bars or handrails. First and last steps. Where the edge of each step is. Use tools that help you move around (mobility aids) if they are needed. These include: Canes. Walkers. Scooters. Crutches. Turn on the lights when you go into a dark area. Replace any light bulbs as soon as they burn out. Set up your furniture so you have a clear path. Avoid moving your furniture around. If any of your floors are uneven, fix them. If there are any pets around you, be aware of where they are. Review your medicines with your doctor. Some medicines can make you feel dizzy. This can increase your chance of falling. Ask your doctor what other things that you can do to help prevent falls. This information is not intended to replace advice given to you by your health care provider. Make sure you discuss any questions you have with your health care provider. Document Released: 07/23/2009 Document Revised: 03/03/2016 Document Reviewed: 10/31/2014 Elsevier Interactive Patient Education  2017 [employer].

## 2022-08-22 DIAGNOSIS — H35373 Puckering of macula, bilateral: Secondary | ICD-10-CM | POA: Diagnosis not present

## 2022-08-22 DIAGNOSIS — H353221 Exudative age-related macular degeneration, left eye, with active choroidal neovascularization: Secondary | ICD-10-CM | POA: Diagnosis not present

## 2022-09-26 DIAGNOSIS — H353112 Nonexudative age-related macular degeneration, right eye, intermediate dry stage: Secondary | ICD-10-CM | POA: Diagnosis not present

## 2022-09-26 DIAGNOSIS — H35371 Puckering of macula, right eye: Secondary | ICD-10-CM | POA: Diagnosis not present

## 2022-09-26 DIAGNOSIS — H353221 Exudative age-related macular degeneration, left eye, with active choroidal neovascularization: Secondary | ICD-10-CM | POA: Diagnosis not present

## 2022-09-28 DIAGNOSIS — Z20822 Contact with and (suspected) exposure to covid-19: Secondary | ICD-10-CM | POA: Diagnosis not present

## 2022-09-28 DIAGNOSIS — U071 COVID-19: Secondary | ICD-10-CM | POA: Diagnosis not present

## 2022-09-28 DIAGNOSIS — R059 Cough, unspecified: Secondary | ICD-10-CM | POA: Diagnosis not present

## 2022-10-24 DIAGNOSIS — L821 Other seborrheic keratosis: Secondary | ICD-10-CM | POA: Diagnosis not present

## 2022-10-24 DIAGNOSIS — Z85828 Personal history of other malignant neoplasm of skin: Secondary | ICD-10-CM | POA: Diagnosis not present

## 2022-10-24 DIAGNOSIS — D225 Melanocytic nevi of trunk: Secondary | ICD-10-CM | POA: Diagnosis not present

## 2022-10-24 DIAGNOSIS — L570 Actinic keratosis: Secondary | ICD-10-CM | POA: Diagnosis not present

## 2022-11-14 DIAGNOSIS — M810 Age-related osteoporosis without current pathological fracture: Secondary | ICD-10-CM | POA: Diagnosis not present

## 2022-11-14 DIAGNOSIS — Z1231 Encounter for screening mammogram for malignant neoplasm of breast: Secondary | ICD-10-CM | POA: Diagnosis not present

## 2022-11-14 DIAGNOSIS — Z681 Body mass index (BMI) 19 or less, adult: Secondary | ICD-10-CM | POA: Diagnosis not present

## 2022-11-14 DIAGNOSIS — Z01419 Encounter for gynecological examination (general) (routine) without abnormal findings: Secondary | ICD-10-CM | POA: Diagnosis not present

## 2022-11-15 DIAGNOSIS — H52203 Unspecified astigmatism, bilateral: Secondary | ICD-10-CM | POA: Diagnosis not present

## 2022-11-15 DIAGNOSIS — Z961 Presence of intraocular lens: Secondary | ICD-10-CM | POA: Diagnosis not present

## 2022-11-21 DIAGNOSIS — H353221 Exudative age-related macular degeneration, left eye, with active choroidal neovascularization: Secondary | ICD-10-CM | POA: Diagnosis not present

## 2022-11-23 DIAGNOSIS — M810 Age-related osteoporosis without current pathological fracture: Secondary | ICD-10-CM | POA: Diagnosis not present

## 2022-11-23 DIAGNOSIS — N958 Other specified menopausal and perimenopausal disorders: Secondary | ICD-10-CM | POA: Diagnosis not present

## 2022-11-23 DIAGNOSIS — R29890 Loss of height: Secondary | ICD-10-CM | POA: Diagnosis not present

## 2022-11-23 DIAGNOSIS — M816 Localized osteoporosis [Lequesne]: Secondary | ICD-10-CM | POA: Diagnosis not present

## 2022-11-23 DIAGNOSIS — Z8262 Family history of osteoporosis: Secondary | ICD-10-CM | POA: Diagnosis not present

## 2022-12-16 ENCOUNTER — Other Ambulatory Visit: Payer: Self-pay | Admitting: Family Medicine

## 2022-12-16 NOTE — Telephone Encounter (Signed)
Last refill: 06/03/22 #30, 5 Last OV: 06/03/22 dx. Anxiety

## 2023-01-09 ENCOUNTER — Other Ambulatory Visit: Payer: Self-pay | Admitting: Family Medicine

## 2023-01-10 NOTE — Telephone Encounter (Signed)
Pt requesting refill for Zolpidem 10 mg. Last OV 05/2022.

## 2023-01-30 DIAGNOSIS — H35371 Puckering of macula, right eye: Secondary | ICD-10-CM | POA: Diagnosis not present

## 2023-01-30 DIAGNOSIS — H353221 Exudative age-related macular degeneration, left eye, with active choroidal neovascularization: Secondary | ICD-10-CM | POA: Diagnosis not present

## 2023-01-30 DIAGNOSIS — H353112 Nonexudative age-related macular degeneration, right eye, intermediate dry stage: Secondary | ICD-10-CM | POA: Diagnosis not present

## 2023-04-17 DIAGNOSIS — H353221 Exudative age-related macular degeneration, left eye, with active choroidal neovascularization: Secondary | ICD-10-CM | POA: Diagnosis not present

## 2023-05-17 DIAGNOSIS — M810 Age-related osteoporosis without current pathological fracture: Secondary | ICD-10-CM | POA: Diagnosis not present

## 2023-05-23 DIAGNOSIS — M810 Age-related osteoporosis without current pathological fracture: Secondary | ICD-10-CM | POA: Diagnosis not present

## 2023-06-16 ENCOUNTER — Other Ambulatory Visit: Payer: Self-pay | Admitting: Family Medicine

## 2023-06-16 ENCOUNTER — Other Ambulatory Visit: Payer: Self-pay | Admitting: *Deleted

## 2023-06-18 ENCOUNTER — Other Ambulatory Visit: Payer: Self-pay | Admitting: Family Medicine

## 2023-06-20 ENCOUNTER — Ambulatory Visit: Payer: Medicare PPO | Admitting: Family Medicine

## 2023-06-20 ENCOUNTER — Encounter: Payer: Self-pay | Admitting: Family Medicine

## 2023-06-20 VITALS — BP 100/64 | HR 71 | Temp 97.3°F | Ht 66.5 in | Wt 119.2 lb

## 2023-06-20 DIAGNOSIS — F419 Anxiety disorder, unspecified: Secondary | ICD-10-CM

## 2023-06-20 DIAGNOSIS — F5101 Primary insomnia: Secondary | ICD-10-CM

## 2023-06-20 DIAGNOSIS — E782 Mixed hyperlipidemia: Secondary | ICD-10-CM | POA: Diagnosis not present

## 2023-06-20 MED ORDER — ZOLPIDEM TARTRATE 10 MG PO TABS: ORAL_TABLET | ORAL | 1 refills | Status: DC

## 2023-06-20 NOTE — Patient Instructions (Signed)
It was very nice to see you today!  We will refill your medications today.  Will see you back soon for your physical.  Come back sooner if needed.  Take care, Dr Jimmey Ralph  PLEASE NOTE:  If you had any lab tests, please let us know if you have not heard back within a few days. You may see your results on mychart before we have a chance to review them but we will give you a call once they are reviewed by [place].   If we ordered any referrals today, please let us know if you have not heard from their office within the next week.   If you had any urgent prescriptions sent in today, please check with the pharmacy within an hour of our visit to make sure the prescription was transmitted appropriately.   Please try these tips to maintain a healthy lifestyle:  Eat at least 3 REAL meals and 1-2 snacks per day.  Aim for no more than 5 hours between eating.  If you eat breakfast, please do so within one hour of getting up.   Each meal should contain half fruits/vegetables, one quarter protein, and one quarter carbs (no bigger than a computer mouse)  Cut down on sweet beverages. This includes juice, soda, and sweet tea.   Drink at least 1 glass of water with each meal and aim for at least 8 glasses per day  Exercise at least 150 minutes every week.

## 2023-06-20 NOTE — Progress Notes (Signed)
   Misty Bullock is a 68 y.o. female who presents today for an office visit.  Assessment/Plan:  Chronic Problems Addressed Today: Anxiety She has been under more stress recently and dealing with caregiver burden related to the declining health of her mother who is now in a nursing facility.  Overall is managing as best as she can.  She is doing well with current regimen of Prozac 40 mg daily and setting 0.5 mg daily as needed.  No significant side effects.  We have referred her to see a therapist in the past however she did not follow-up with them.  We discussed this again today however she is not interested at this time.  Does not need refill on medications today.  She will follow-up again in a few months for CPE.  Insomnia Stable on Ambien 5 mg nightly as needed.  Tolerating well without any significant side effects.  Will refill today.  Database was reviewed without red flags.  She will follow-up again in a few months for CPE.  Hyperlipidemia Check lipids at CPE.    Subjective:  HPI:  See A/P for status of chronic conditions.  Patient is here today for follow-up.  Last saw her over a year ago.  At her last visit we had continued her on her regimen for anxiety and insomnia with Prozac 40 mg daily, Xanax 0.5 mg daily as needed, and Ambien 5 mg nightly as needed.  We refer her to see a therapist as well at that time.  She was not able to follow up with them.  Overall is doing well with medications.  Does admit to being under more stress related to care for her mother who is currently in a nursing facility.  She feels like she is overall       Objective:  Physical Exam: BP 100/64   Pulse 71   Temp (!) 97.3 F (36.3 C) (Temporal)   Ht 5' 6.5" (1.689 m)   Wt 119 lb 3.2 oz (54.1 kg)   LMP  (LMP Unknown)   SpO2 98%   BMI 18.95 kg/m   Gen: No acute distress, resting comfortably CV: Regular rate and rhythm with no murmurs appreciated Pulm: Normal work of breathing, clear to auscultation  bilaterally with no crackles, wheezes, or rhonchi Neuro: Grossly normal, moves all extremities Psych: Normal affect and thought content      Labib Cwynar M. Jimmey Ralph, MD 06/20/2023 11:49 AM

## 2023-06-20 NOTE — Assessment & Plan Note (Signed)
Stable on Ambien 5 mg nightly as needed.  Tolerating well without any significant side effects.  Will refill today.  Database was reviewed without red flags.  She will follow-up again in a few months for CPE.

## 2023-06-20 NOTE — Assessment & Plan Note (Signed)
Check lipids at CPE.

## 2023-06-20 NOTE — Assessment & Plan Note (Signed)
She has been under more stress recently and dealing with caregiver burden related to the declining health of her mother who is now in a nursing facility.  Overall is managing as best as she can.  She is doing well with current regimen of Prozac 40 mg daily and setting 0.5 mg daily as needed.  No significant side effects.  We have referred her to see a therapist in the past however she did not follow-up with them.  We discussed this again today however she is not interested at this time.  Does not need refill on medications today.  She will follow-up again in a few months for CPE.

## 2023-06-26 DIAGNOSIS — H35371 Puckering of macula, right eye: Secondary | ICD-10-CM | POA: Diagnosis not present

## 2023-06-26 DIAGNOSIS — H353112 Nonexudative age-related macular degeneration, right eye, intermediate dry stage: Secondary | ICD-10-CM | POA: Diagnosis not present

## 2023-06-26 DIAGNOSIS — H353221 Exudative age-related macular degeneration, left eye, with active choroidal neovascularization: Secondary | ICD-10-CM | POA: Diagnosis not present

## 2023-07-11 ENCOUNTER — Other Ambulatory Visit: Payer: Self-pay | Admitting: Family Medicine

## 2023-08-21 DIAGNOSIS — H353221 Exudative age-related macular degeneration, left eye, with active choroidal neovascularization: Secondary | ICD-10-CM | POA: Diagnosis not present

## 2023-08-28 ENCOUNTER — Ambulatory Visit (INDEPENDENT_AMBULATORY_CARE_PROVIDER_SITE_OTHER): Payer: Medicare PPO

## 2023-08-28 VITALS — BP 120/80 | HR 83 | Temp 98.2°F | Wt 119.0 lb

## 2023-08-28 DIAGNOSIS — Z0000 Encounter for general adult medical examination without abnormal findings: Secondary | ICD-10-CM

## 2023-08-28 NOTE — Progress Notes (Signed)
Subjective:   Misty Bullock is a 67 y.o. female who presents for Medicare Annual (Subsequent) preventive examination.  Visit Complete: In person   Cardiac Risk Factors include: advanced age (>3men, >51 women);dyslipidemia     Objective:    Today's Vitals   08/28/23 1444  BP: 120/80  Pulse: 83  Temp: 98.2 F (36.8 C)  SpO2: 94%  Weight: 119 lb (54 kg)   Body mass index is 18.92 kg/m.     08/28/2023    2:50 PM 08/19/2022    1:59 PM 08/06/2021    1:08 PM 03/03/2017    2:06 PM 03/03/2017    9:37 AM 03/02/2017    1:09 PM 02/27/2017    3:22 AM  Advanced Directives  Does Patient Have a Medical Advance Directive? Yes Yes Yes No No No Yes  Type of Estate agent of [place];Living will Healthcare Power of [place];Living will Healthcare Power of [employer] of [place];Living will Healthcare Power of [place];Living will Healthcare Power of [place];Living will Living will  Copy of Healthcare Power of Attorney in Chart? No - copy requested No - copy requested No - copy requested No - copy requested No - copy requested    Would patient like information on creating a medical advance directive?     No - Patient declined      Current Medications (verified) Outpatient Encounter Medications as of 08/28/2023  Medication Sig   ALPRAZolam (XANAX) 0.5 MG tablet TAKE 1 TABLET BY MOUTH AT BEDTIME AS NEEDED FOR ANXIETY   denosumab (PROLIA) 60 MG/ML SOSY injection Prolia 60 mg/mL subcutaneous syringe  Dispense 1 prefilled syringe to MD office for SQ injection Q 6 months   FLUoxetine (PROZAC) 40 MG capsule TAKE 1 CAPSULE (40 MG TOTAL) BY MOUTH DAILY.   valACYclovir (VALTREX) 500 MG tablet valacyclovir 500 mg tablet   zolpidem (AMBIEN) 10 MG tablet TAKE 1/2 TABLET BY MOUTH EVERY DAY AT BEDTIME   No facility-administered encounter medications on file as of 08/28/2023.    Allergies (verified) Latex   History: Past Medical History:  Diagnosis Date    Anxiety    History of chicken pox    Insomnia    Restless leg syndrome    Past Surgical History:  Procedure Laterality Date   bilateral blerphoplasty      cataract surgery      OPEN REDUCTION INTERNAL FIXATION (ORIF) DISTAL RADIAL FRACTURE Left 03/03/2017   Procedure: OPEN REDUCTION INTERNAL FIXATION (ORIF) LEFT DISTAL RADIUS FRACTURE;  Surgeon: Kathryne Hitch, MD;  Location: [facility];  Service: Orthopedics;  Laterality: Left;   WISDOM TOOTH EXTRACTION     History reviewed. No pertinent family history. Social History   Socioeconomic History   Marital status: Single    Spouse name: Not on file   Number of children: Not on file   Years of education: Not on file   Highest education level: Not on file  Occupational History   Not on file  Tobacco Use   Smoking status: Never   Smokeless tobacco: Never  Vaping Use   Vaping status: Never Used  Substance and Sexual Activity   Alcohol use: Yes    Alcohol/week: 0.0 standard drinks of alcohol    Comment: wine   Drug use: No   Sexual activity: Not on file  Other Topics Concern   Not on file  Social History Narrative   Not on file   Social Determinants of Health   Financial Resource Strain: Low Risk  (  08/28/2023)   Overall Financial Resource Strain (CARDIA)    Difficulty of Paying Living Expenses: Not hard at all  Food Insecurity: No Food Insecurity (08/28/2023)   Hunger Vital Sign    Worried About Running Out of Food in the Last Year: Never true    Ran Out of Food in the Last Year: Never true  Transportation Needs: No Transportation Needs (08/28/2023)   PRAPARE - Administrator, Civil Service (Medical): No    Lack of Transportation (Non-Medical): No  Physical Activity: Inactive (08/28/2023)   Exercise Vital Sign    Days of Exercise per Week: 0 days    Minutes of Exercise per Session: 0 min  Stress: No Stress Concern Present (08/28/2023)   [employer] of Occupational Health - Occupational Stress  Questionnaire    Feeling of Stress : Only a little  Social Connections: Socially Isolated (08/28/2023)   Social Connection and Isolation Panel [NHANES]    Frequency of Communication with Friends and Family: More than three times a week    Frequency of Social Gatherings with Friends and Family: More than three times a week    Attends Religious Services: Never    Database administrator or Organizations: No    Attends Engineer, structural: Never    Marital Status: Never married    Tobacco Counseling Counseling given: Not Answered   Clinical Intake:  Pre-visit preparation completed: Yes  Pain : No/denies pain     BMI - recorded: 18.92 Nutritional Status: BMI <19  Underweight Diabetes: No  How often do you need to have someone help you when you read instructions, pamphlets, or other written materials from your doctor or pharmacy?: 1 - Never  Interpreter Needed?: No  Information entered by :: Lanier Ensign, LPN   Activities of Daily Living    08/28/2023    2:46 PM  In your present state of health, do you have any difficulty performing the following activities:  Hearing? 0  Vision? 0  Difficulty concentrating or making decisions? 0  Walking or climbing stairs? 0  Dressing or bathing? 0  Doing errands, shopping? 0  Preparing Food and eating ? N  Using the Toilet? N  In the past six months, have you accidently leaked urine? N  Do you have problems with loss of bowel control? N  Managing your Medications? N  Managing your Finances? N  Housekeeping or managing your Housekeeping? N    Patient Care Team: Ardith Dark, MD as PCP - General (Family Medicine) Maris Berger, MD as Consulting Physician (Ophthalmology) Richardean Chimera, MD as Consulting Physician (Obstetrics and Gynecology) [place], Amy, MD as Consulting Physician (Dermatology)  Indicate any recent Medical Services you may have received from other than [facility] providers in the past year (date may be  approximate).     Assessment:   This is a routine wellness examination for Misty Bullock.  Hearing/Vision screen Hearing Screening - Comments:: Pt denies any hearing issues  Vision Screening - Comments:: Pt follows up with  dr Charlotte Sanes for annual eye exams    Goals Addressed   None    Depression Screen    08/28/2023    2:48 PM 06/20/2023   11:20 AM 08/19/2022    1:49 PM 06/03/2022    2:15 PM 08/06/2021    1:06 PM 06/01/2021    1:36 PM 03/03/2021    2:14 PM  PHQ 2/9 Scores  PHQ - 2 Score 1 0 0 2 1 0 1  PHQ- 9  Score    6 3  4     Fall Risk    08/28/2023    2:51 PM 06/20/2023   11:20 AM 08/19/2022    2:00 PM 06/03/2022    1:23 PM 08/06/2021    1:09 PM  Fall Risk   Falls in the past year? 0 0 1 0 0  Number falls in past yr: 0 0 1 0 0  Injury with Fall? 0 0 1 0 0  Comment   left wrist    Risk for fall due to : No Fall Risks No Fall Risks  No Fall Risks   Follow up Falls prevention discussed  Falls prevention discussed  Falls prevention discussed    MEDICARE RISK AT HOME: Medicare Risk at Home Any stairs in or around the home?: Yes If so, are there any without handrails?: No Home free of loose throw rugs in walkways, pet beds, electrical cords, etc?: Yes Adequate lighting in your home to reduce risk of falls?: Yes Life alert?: No Use of a cane, walker or w/c?: No Grab bars in the bathroom?: No Shower chair or bench in shower?: No Elevated toilet seat or a handicapped toilet?: No  TIMED UP AND GO:  Was the test performed?  No    Cognitive Function:        08/28/2023    2:51 PM 08/19/2022    2:02 PM 08/06/2021    1:11 PM  6CIT Screen  What Year? 0 points 0 points 0 points  What month? 0 points 0 points 0 points  What time? 0 points 0 points 0 points  Count back from 20 0 points 0 points 0 points  Months in reverse 0 points 0 points 0 points  Repeat phrase 0 points 0 points 0 points  Total Score 0 points 0 points 0 points    Immunizations Immunization History   Administered Date(s) Administered   Influenza, High Dose Seasonal PF 06/03/2019   Influenza-Unspecified 07/28/2016, 08/12/2019   PNEUMOCOCCAL CONJUGATE-20 01/03/2021   Pneumococcal Polysaccharide-23 10/23/2019   Tdap 06/05/2012, 03/29/2018   Zoster Recombinant(Shingrix) 11/15/2017, 01/27/2018   Zoster, Live 07/18/2014    TDAP status: Up to date  Flu Vaccine status: Due, Education has been provided regarding the importance of this vaccine. Advised may receive this vaccine at local pharmacy or Health Dept. Aware to provide a copy of the vaccination record if obtained from local pharmacy or Health Dept. Verbalized acceptance and understanding.  Pneumococcal vaccine status: Up to date  Covid-19 vaccine status: Information provided on how to obtain vaccines.   Qualifies for Shingles Vaccine? Yes   Zostavax completed Yes   Shingrix Completed?: Yes  Screening Tests Health Maintenance  Topic Date Due   COVID-19 Vaccine  Never done   MAMMOGRAM  07/10/2021   Medicare Annual Wellness (AWV)  08/27/2024   Colonoscopy  03/01/2027   DTaP/Tdap/Td (3 - Td or Tdap) 03/29/2028   Pneumonia Vaccine 48+ Years old  Completed   DEXA SCAN  Completed   Hepatitis C Screening  Completed   Zoster Vaccines- Shingrix  Completed   HPV VACCINES  Aged Out   INFLUENZA VACCINE  Discontinued    Health Maintenance  Health Maintenance Due  Topic Date Due   COVID-19 Vaccine  Never done   MAMMOGRAM  07/10/2021    Colorectal cancer screening: Type of screening: Colonoscopy. Completed 02/28/22. Repeat every 5 years  Mammogram status: Completed per pt . Repeat every year  Bone Density status: Completed 08/13/19. Results reflect: Bone  density results: OSTEOPOROSIS. Repeat every 2 years.  Additional Screening:  Hepatitis C Screening:  Completed 06/18/21  Vision Screening: Recommended annual ophthalmology exams for early detection of glaucoma and other disorders of the eye. Is the patient up to date with  their annual eye exam?  Yes  Who is the provider or what is the name of the office in which the patient attends annual eye exams? [facility]  If pt is not established with a provider, would they like to be referred to a provider to establish care? No .   Dental Screening: Recommended annual dental exams for proper oral hygiene   Community Resource Referral / Chronic Care Management: CRR required this visit?  No   CCM required this visit?  No     Plan:     I have personally reviewed and noted the following in the patient's chart:   Medical and social history Use of alcohol, tobacco or illicit drugs  Current medications and supplements including opioid prescriptions. Patient is not currently taking opioid prescriptions. Functional ability and status Nutritional status Physical activity Advanced directives List of other physicians Hospitalizations, surgeries, and ER visits in previous 12 months Vitals Screenings to include cognitive, depression, and falls Referrals and appointments  In addition, I have reviewed and discussed with patient certain preventive protocols, quality metrics, and best practice recommendations. A written personalized care plan for preventive services as well as general preventive health recommendations were provided to patient.     Marzella Schlein, LPN   16/07/[id number]   After Visit Summary: (In Person-Printed) AVS printed and given to the patient  Nurse Notes: none

## 2023-08-28 NOTE — Patient Instructions (Signed)
Misty Bullock , Thank you for taking time to come for your Medicare Wellness Visit. I appreciate your ongoing commitment to your health goals. Please review the following plan we discussed and let me know if I can assist you in the future.   Referrals/Orders/Follow-Ups/Clinician Recommendations: maintain health and activity   This is a list of the screening recommended for you and due dates:  Health Maintenance  Topic Date Due   COVID-19 Vaccine  Never done   Mammogram  07/10/2021   Medicare Annual Wellness Visit  08/27/2024   Colon Cancer Screening  03/01/2027   DTaP/Tdap/Td vaccine (3 - Td or Tdap) 03/29/2028   Pneumonia Vaccine  Completed   DEXA scan (bone density measurement)  Completed   Hepatitis C Screening  Completed   Zoster (Shingles) Vaccine  Completed   HPV Vaccine  Aged Out   Flu Shot  Discontinued    Advanced directives: (Copy Requested) Please bring a copy of your health care power of attorney and living will to the office to be added to your chart at your convenience.  Next Medicare Annual Wellness Visit scheduled for next year: Yes

## 2023-08-31 ENCOUNTER — Encounter: Payer: Self-pay | Admitting: Family Medicine

## 2023-08-31 ENCOUNTER — Ambulatory Visit (INDEPENDENT_AMBULATORY_CARE_PROVIDER_SITE_OTHER): Payer: Medicare PPO | Admitting: Family Medicine

## 2023-08-31 VITALS — BP 117/83 | HR 81 | Temp 98.2°F | Ht 60.65 in | Wt 118.4 lb

## 2023-08-31 DIAGNOSIS — M810 Age-related osteoporosis without current pathological fracture: Secondary | ICD-10-CM

## 2023-08-31 DIAGNOSIS — Z0000 Encounter for general adult medical examination without abnormal findings: Secondary | ICD-10-CM

## 2023-08-31 DIAGNOSIS — F419 Anxiety disorder, unspecified: Secondary | ICD-10-CM

## 2023-08-31 DIAGNOSIS — R739 Hyperglycemia, unspecified: Secondary | ICD-10-CM | POA: Diagnosis not present

## 2023-08-31 DIAGNOSIS — E782 Mixed hyperlipidemia: Secondary | ICD-10-CM

## 2023-08-31 DIAGNOSIS — Z0001 Encounter for general adult medical examination with abnormal findings: Secondary | ICD-10-CM | POA: Diagnosis not present

## 2023-08-31 DIAGNOSIS — F5101 Primary insomnia: Secondary | ICD-10-CM

## 2023-08-31 LAB — LIPID PANEL
Cholesterol: 312 mg/dL — ABNORMAL HIGH (ref 0–200)
HDL: 122.6 mg/dL (ref >39.00)
LDL Cholesterol: 176 mg/dL — ABNORMAL HIGH (ref 0–99)
NonHDL: 189.7
Total CHOL/HDL Ratio: 3
Triglycerides: 70 mg/dL (ref 0.0–149.0)
VLDL: 14 mg/dL (ref 0.0–40.0)

## 2023-08-31 LAB — CBC
HCT: 47.1 % — ABNORMAL HIGH (ref 36.0–46.0)
Hemoglobin: 15.7 g/dL — ABNORMAL HIGH (ref 12.0–15.0)
MCHC: 33.2 g/dL (ref 30.0–36.0)
MCV: 99.6 fL (ref 78.0–100.0)
Platelets: 244 10*3/uL (ref 150.0–400.0)
RBC: 4.73 Mil/uL (ref 3.87–5.11)
RDW: 13.9 % (ref 11.5–15.5)
WBC: 4.5 10*3/uL (ref 4.0–10.5)

## 2023-08-31 LAB — TSH: TSH: 1.96 u[IU]/mL (ref 0.35–5.50)

## 2023-08-31 LAB — COMPREHENSIVE METABOLIC PANEL
ALT: 18 U/L (ref 0–35)
AST: 37 U/L (ref 0–37)
Albumin: 4.5 g/dL (ref 3.5–5.2)
Alkaline Phosphatase: 56 U/L (ref 39–117)
BUN: 12 mg/dL (ref 6–23)
CO2: 28 meq/L (ref 19–32)
Calcium: 9.9 mg/dL (ref 8.4–10.5)
Chloride: 97 meq/L (ref 96–112)
Creatinine, Ser: 0.76 mg/dL (ref 0.40–1.20)
GFR: 80.02 mL/min (ref >60.00)
Glucose, Bld: 87 mg/dL (ref 70–99)
Potassium: 4.5 meq/L (ref 3.5–5.1)
Sodium: 136 meq/L (ref 135–145)
Total Bilirubin: 0.5 mg/dL (ref 0.2–1.2)
Total Protein: 7.5 g/dL (ref 6.0–8.3)

## 2023-08-31 LAB — VITAMIN D 25 HYDROXY (VIT D DEFICIENCY, FRACTURES): VITD: 84.83 ng/mL (ref 30.00–100.00)

## 2023-08-31 LAB — HEMOGLOBIN A1C: Hgb A1c MFr Bld: 5.4 % (ref 4.6–6.5)

## 2023-08-31 NOTE — Assessment & Plan Note (Signed)
Overall symptoms are stable.  Continue Prozac 40 mg daily, and alprazolam 0.5 mg nightly as needed.  Does not need refill today.

## 2023-08-31 NOTE — Patient Instructions (Addendum)
It was very nice to see you today!  We will check blood work today.  Please continue to work on diet and exercise.  I will see you back in year for your next physical.  Come back sooner if needed.  Return in about 1 year (around 08/30/2024) for Annual Physical.   Take care, Dr Jimmey Ralph  PLEASE NOTE:  If you had any lab tests, please let us know if you have not heard back within a few days. You may see your results on mychart before we have a chance to review them but we will give you a call once they are reviewed by [place].   If we ordered any referrals today, please let us know if you have not heard from their office within the next week.   If you had any urgent prescriptions sent in today, please check with the pharmacy within an hour of our visit to make sure the prescription was transmitted appropriately.   Please try these tips to maintain a healthy lifestyle:  Eat at least 3 REAL meals and 1-2 snacks per day.  Aim for no more than 5 hours between eating.  If you eat breakfast, please do so within one hour of getting up.   Each meal should contain half fruits/vegetables, one quarter protein, and one quarter carbs (no bigger than a computer mouse)  Cut down on sweet beverages. This includes juice, soda, and sweet tea.   Drink at least 1 glass of water with each meal and aim for at least 8 glasses per day  Exercise at least 150 minutes every week.    Preventive Care 27 Years and Older, Female Preventive care refers to lifestyle choices and visits with your health care provider that can promote health and wellness. Preventive care visits are also called wellness exams. What can I expect for my preventive care visit? Counseling Your health care provider may ask you questions about your: Medical history, including: Past medical problems. Family medical history. Pregnancy and menstrual history. History of falls. Current health, including: Memory and ability to understand  (cognition). Emotional well-being. Home life and relationship well-being. Sexual activity and sexual health. Lifestyle, including: Alcohol, nicotine or tobacco, and drug use. Access to firearms. Diet, exercise, and sleep habits. Work and work Astronomer. Sunscreen use. Safety issues such as seatbelt and bike helmet use. Physical exam Your health care provider will check your: Height and weight. These may be used to calculate your BMI (body mass index). BMI is a measurement that tells if you are at a healthy weight. Waist circumference. This measures the distance around your waistline. This measurement also tells if you are at a healthy weight and may help predict your risk of certain diseases, such as type 2 diabetes and high blood pressure. Heart rate and blood pressure. Body temperature. Skin for abnormal spots. What immunizations do I need?  Vaccines are usually given at various ages, according to a schedule. Your health care provider will recommend vaccines for you based on your age, medical history, and lifestyle or other factors, such as travel or where you work. What tests do I need? Screening Your health care provider may recommend screening tests for certain conditions. This may include: Lipid and cholesterol levels. Hepatitis C test. Hepatitis B test. HIV (human immunodeficiency virus) test. STI (sexually transmitted infection) testing, if you are at risk. Lung cancer screening. Colorectal cancer screening. Diabetes screening. This is done by checking your blood sugar (glucose) after you have not eaten for a  while (fasting). Mammogram. Talk with your health care provider about how often you should have regular mammograms. BRCA-related cancer screening. This may be done if you have a family history of breast, ovarian, tubal, or peritoneal cancers. Bone density scan. This is done to screen for osteoporosis. Talk with your health care provider about your test results,  treatment options, and if necessary, the need for more tests. Follow these instructions at home: Eating and drinking  Eat a diet that includes fresh fruits and vegetables, whole grains, lean protein, and low-fat dairy products. Limit your intake of foods with high amounts of sugar, saturated fats, and salt. Take vitamin and mineral supplements as recommended by your health care provider. Do not drink alcohol if your health care provider tells you not to drink. If you drink alcohol: Limit how much you have to 0-1 drink a day. Know how much alcohol is in your drink. In the [place] one drink equals one 12 oz bottle of beer (355 mL), one 5 oz glass of wine (148 mL), or one 1 oz glass of hard liquor (44 mL). Lifestyle Brush your teeth every morning and night with fluoride toothpaste. Floss one time each day. Exercise for at least 30 minutes 5 or more days each week. Do not use any products that contain nicotine or tobacco. These products include cigarettes, chewing tobacco, and vaping devices, such as e-cigarettes. If you need help quitting, ask your health care provider. Do not use drugs. If you are sexually active, practice safe sex. Use a condom or other form of protection in order to prevent STIs. Take aspirin only as told by your health care provider. Make sure that you understand how much to take and what form to take. Work with your health care provider to find out whether it is safe and beneficial for you to take aspirin daily. Ask your health care provider if you need to take a cholesterol-lowering medicine (statin). Find healthy ways to manage stress, such as: Meditation, yoga, or listening to music. Journaling. Talking to a trusted person. Spending time with friends and family. Minimize exposure to UV radiation to reduce your risk of skin cancer. Safety Always wear your seat belt while driving or riding in a vehicle. Do not drive: If you have been drinking alcohol. Do not ride with  someone who has been drinking. When you are tired or distracted. While texting. If you have been using any mind-altering substances or drugs. Wear a helmet and other protective equipment during sports activities. If you have firearms in your house, make sure you follow all gun safety procedures. What's next? Visit your health care provider once a year for an annual wellness visit. Ask your health care provider how often you should have your eyes and teeth checked. Stay up to date on all vaccines. This information is not intended to replace advice given to you by your health care provider. Make sure you discuss any questions you have with your health care provider. Document Revised: 03/24/2021 Document Reviewed: 03/24/2021 Elsevier Patient Education  2024 [employer].

## 2023-08-31 NOTE — Assessment & Plan Note (Signed)
Stable on Ambien 5 mg nightly as needed.  No significant side effects.

## 2023-08-31 NOTE — Assessment & Plan Note (Signed)
Check lipids 

## 2023-08-31 NOTE — Assessment & Plan Note (Signed)
On Prolia per GYN.  No longer on vitamin D supplementation since having elevated level last year.  Recheck vitamin D level today.

## 2023-08-31 NOTE — Progress Notes (Signed)
Chief Complaint:  Misty Bullock is a 67 y.o. female who presents today for her annual comprehensive physical exam.    Assessment/Plan:  Chronic Problems Addressed Today: Hyperlipidemia Check lipids.   Anxiety Overall symptoms are stable.  Continue Prozac 40 mg daily, and alprazolam 0.5 mg nightly as needed.  Does not need refill today.  Insomnia Stable on Ambien 5 mg nightly as needed.  No significant side effects.  Osteoporosis On Prolia per GYN.  No longer on vitamin D supplementation since having elevated level last year.  Recheck vitamin D level today.  Preventative Healthcare: Check labs.  She will follow-up with GYN soon for womens health.  Up-to-date on vaccines.  Up-to-date on colon cancer screening.  Patient Counseling(The following topics were reviewed and/or handout was given):  -Nutrition: Stressed importance of moderation in sodium/caffeine intake, saturated fat and cholesterol, caloric balance, sufficient intake of fresh fruits, vegetables, and fiber.  -Stressed the importance of regular exercise.   -Substance Abuse: Discussed cessation/primary prevention of tobacco, alcohol, or other drug use; driving or other dangerous activities under the influence; availability of treatment for abuse.   -Injury prevention: Discussed safety belts, safety helmets, smoke detector, smoking near bedding or upholstery.   -Sexuality: Discussed sexually transmitted diseases, partner selection, use of condoms, avoidance of unintended pregnancy and contraceptive alternatives.   -Dental health: Discussed importance of regular tooth brushing, flossing, and dental visits.  -Health maintenance and immunizations reviewed. Please refer to Health maintenance section.  Return to care in 1 year for next preventative visit.     Subjective:  HPI:  She has no acute complaints today. See Assessment / plan for status of chronic conditions.   Lifestyle Diet: None specific. Tries to eat healthy food  choices. Avoiding sweets.  Exercise: None specific.      08/28/2023    2:48 PM  Depression screen PHQ 2/9  Decreased Interest 0  Down, Depressed, Hopeless 1  PHQ - 2 Score 1    Health Maintenance Due  Topic Date Due   COVID-19 Vaccine  Never done   MAMMOGRAM  07/10/2021     ROS: Per HPI, otherwise a complete review of systems was negative.   PMH:  The following were reviewed and entered/updated in epic: Past Medical History:  Diagnosis Date   Anxiety    History of chicken pox    Insomnia    Restless leg syndrome    Patient Active Problem List   Diagnosis Date Noted   Hyperlipidemia 12/04/2019   Osteoporosis 12/04/2019   RLS (restless legs syndrome) 03/29/2018   Anxiety 03/29/2018   Insomnia 03/29/2018   Past Surgical History:  Procedure Laterality Date   bilateral blerphoplasty      cataract surgery      OPEN REDUCTION INTERNAL FIXATION (ORIF) DISTAL RADIAL FRACTURE Left 03/03/2017   Procedure: OPEN REDUCTION INTERNAL FIXATION (ORIF) LEFT DISTAL RADIUS FRACTURE;  Surgeon: Kathryne Hitch, MD;  Location: [facility];  Service: Orthopedics;  Laterality: Left;   WISDOM TOOTH EXTRACTION      History reviewed. No pertinent family history.  Medications- reviewed and updated Current Outpatient Medications  Medication Sig Dispense Refill   ALPRAZolam (XANAX) 0.5 MG tablet TAKE 1 TABLET BY MOUTH AT BEDTIME AS NEEDED FOR ANXIETY 30 tablet 5   denosumab (PROLIA) 60 MG/ML SOSY injection Prolia 60 mg/mL subcutaneous syringe  Dispense 1 prefilled syringe to MD office for SQ injection Q 6 months     FLUoxetine (PROZAC) 40 MG capsule TAKE 1 CAPSULE (40  MG TOTAL) BY MOUTH DAILY. 90 capsule 0   valACYclovir (VALTREX) 500 MG tablet valacyclovir 500 mg tablet     zolpidem (AMBIEN) 10 MG tablet TAKE 1/2 TABLET BY MOUTH EVERY DAY AT BEDTIME 45 tablet 1   No current facility-administered medications for this visit.    Allergies-reviewed and updated Allergies  Allergen  Reactions   Latex Swelling    Swelling of lips during dental procedure    Social History   Socioeconomic History   Marital status: Single    Spouse name: Not on file   Number of children: Not on file   Years of education: Not on file   Highest education level: Not on file  Occupational History   Not on file  Tobacco Use   Smoking status: Never   Smokeless tobacco: Never  Vaping Use   Vaping status: Never Used  Substance and Sexual Activity   Alcohol use: Yes    Alcohol/week: 0.0 standard drinks of alcohol    Comment: wine   Drug use: No   Sexual activity: Not on file  Other Topics Concern   Not on file  Social History Narrative   Not on file   Social Determinants of Health   Financial Resource Strain: Low Risk  (08/28/2023)   Overall Financial Resource Strain (CARDIA)    Difficulty of Paying Living Expenses: Not hard at all  Food Insecurity: No Food Insecurity (08/28/2023)   Hunger Vital Sign    Worried About Running Out of Food in the Last Year: Never true    Ran Out of Food in the Last Year: Never true  Transportation Needs: No Transportation Needs (08/28/2023)   PRAPARE - Administrator, Civil Service (Medical): No    Lack of Transportation (Non-Medical): No  Physical Activity: Inactive (08/28/2023)   Exercise Vital Sign    Days of Exercise per Week: 0 days    Minutes of Exercise per Session: 0 min  Stress: No Stress Concern Present (08/28/2023)   [employer] of Occupational Health - Occupational Stress Questionnaire    Feeling of Stress : Only a little  Social Connections: Socially Isolated (08/28/2023)   Social Connection and Isolation Panel [NHANES]    Frequency of Communication with Friends and Family: More than three times a week    Frequency of Social Gatherings with Friends and Family: More than three times a week    Attends Religious Services: Never    Database administrator or Organizations: No    Attends Hospital doctor: Never    Marital Status: Never married        Objective:  Physical Exam: BP 117/83   Pulse 81   Temp 98.2 F (36.8 C) (Temporal)   Ht 5' 0.65" (1.541 m)   Wt 118 lb 6.4 oz (53.7 kg)   LMP  (LMP Unknown)   SpO2 97%   BMI 22.63 kg/m   Body mass index is 22.63 kg/m. Wt Readings from Last 3 Encounters:  08/31/23 118 lb 6.4 oz (53.7 kg)  08/28/23 119 lb (54 kg)  06/20/23 119 lb 3.2 oz (54.1 kg)   Gen: NAD, resting comfortably HEENT: TMs normal bilaterally. OP clear. No thyromegaly noted.  CV: RRR with no murmurs appreciated Pulm: NWOB, CTAB with no crackles, wheezes, or rhonchi GI: Normal bowel sounds present. Soft, Nontender, Nondistended. MSK: no edema, cyanosis, or clubbing noted Skin: warm, dry Neuro: CN2-12 grossly intact. Strength 5/5 in upper and lower extremities. Reflexes symmetric and intact  bilaterally.  Psych: Normal affect and thought content     Cathren Sween M. Jimmey Ralph, MD 08/31/2023 11:28 AM

## 2023-09-01 NOTE — Progress Notes (Signed)
Her labs are stable.  Vitamin D is at goal -do not need any additional supplementation for this at this point.  We can recheck this in year.  Her LDL did increase some since last year however she still has an excellent HDL.  Her overall ratio is good.  Do not need to start meds but she should continue to work on diet and exercise and we can recheck this in a year or so.  The rest of her labs are all stable compared to last year and we can recheck in a year.

## 2023-09-09 DIAGNOSIS — Z20822 Contact with and (suspected) exposure to covid-19: Secondary | ICD-10-CM | POA: Diagnosis not present

## 2023-09-09 DIAGNOSIS — R197 Diarrhea, unspecified: Secondary | ICD-10-CM | POA: Diagnosis not present

## 2023-09-09 DIAGNOSIS — R509 Fever, unspecified: Secondary | ICD-10-CM | POA: Diagnosis not present

## 2023-09-09 DIAGNOSIS — R5383 Other fatigue: Secondary | ICD-10-CM | POA: Diagnosis not present

## 2023-10-16 DIAGNOSIS — H353221 Exudative age-related macular degeneration, left eye, with active choroidal neovascularization: Secondary | ICD-10-CM | POA: Diagnosis not present

## 2023-10-31 DIAGNOSIS — M810 Age-related osteoporosis without current pathological fracture: Secondary | ICD-10-CM | POA: Diagnosis not present

## 2023-11-21 DIAGNOSIS — Z961 Presence of intraocular lens: Secondary | ICD-10-CM | POA: Diagnosis not present

## 2023-11-21 DIAGNOSIS — H52203 Unspecified astigmatism, bilateral: Secondary | ICD-10-CM | POA: Diagnosis not present

## 2023-11-28 DIAGNOSIS — Z681 Body mass index (BMI) 19 or less, adult: Secondary | ICD-10-CM | POA: Diagnosis not present

## 2023-11-28 DIAGNOSIS — M816 Localized osteoporosis [Lequesne]: Secondary | ICD-10-CM | POA: Diagnosis not present

## 2023-11-28 DIAGNOSIS — Z01419 Encounter for gynecological examination (general) (routine) without abnormal findings: Secondary | ICD-10-CM | POA: Diagnosis not present

## 2023-11-28 DIAGNOSIS — Z1231 Encounter for screening mammogram for malignant neoplasm of breast: Secondary | ICD-10-CM | POA: Diagnosis not present

## 2023-12-02 DIAGNOSIS — B349 Viral infection, unspecified: Secondary | ICD-10-CM | POA: Diagnosis not present

## 2023-12-02 DIAGNOSIS — R5383 Other fatigue: Secondary | ICD-10-CM | POA: Diagnosis not present

## 2023-12-02 DIAGNOSIS — Z20822 Contact with and (suspected) exposure to covid-19: Secondary | ICD-10-CM | POA: Diagnosis not present

## 2023-12-04 ENCOUNTER — Other Ambulatory Visit: Payer: Self-pay | Admitting: Obstetrics and Gynecology

## 2023-12-04 DIAGNOSIS — R928 Other abnormal and inconclusive findings on diagnostic imaging of breast: Secondary | ICD-10-CM

## 2023-12-08 ENCOUNTER — Other Ambulatory Visit: Payer: Self-pay | Admitting: Obstetrics and Gynecology

## 2023-12-08 ENCOUNTER — Ambulatory Visit
Admission: RE | Admit: 2023-12-08 | Discharge: 2023-12-08 | Disposition: A | Discharge status: Home / Self Care | Payer: Medicare PPO | Source: Ambulatory Visit | Attending: Obstetrics and Gynecology | Admitting: Obstetrics and Gynecology

## 2023-12-08 DIAGNOSIS — R928 Other abnormal and inconclusive findings on diagnostic imaging of breast: Secondary | ICD-10-CM

## 2023-12-08 DIAGNOSIS — R921 Mammographic calcification found on diagnostic imaging of breast: Secondary | ICD-10-CM | POA: Diagnosis not present

## 2023-12-18 DIAGNOSIS — H353112 Nonexudative age-related macular degeneration, right eye, intermediate dry stage: Secondary | ICD-10-CM | POA: Diagnosis not present

## 2023-12-18 DIAGNOSIS — H353221 Exudative age-related macular degeneration, left eye, with active choroidal neovascularization: Secondary | ICD-10-CM | POA: Diagnosis not present

## 2023-12-18 DIAGNOSIS — Z961 Presence of intraocular lens: Secondary | ICD-10-CM | POA: Diagnosis not present

## 2023-12-18 DIAGNOSIS — H35371 Puckering of macula, right eye: Secondary | ICD-10-CM | POA: Diagnosis not present

## 2023-12-20 ENCOUNTER — Ambulatory Visit
Admission: RE | Admit: 2023-12-20 | Discharge: 2023-12-20 | Disposition: A | Discharge status: Home / Self Care | Source: Ambulatory Visit | Attending: Obstetrics and Gynecology | Admitting: Obstetrics and Gynecology

## 2023-12-20 DIAGNOSIS — R921 Mammographic calcification found on diagnostic imaging of breast: Secondary | ICD-10-CM | POA: Diagnosis not present

## 2023-12-20 DIAGNOSIS — D0511 Intraductal carcinoma in situ of right breast: Secondary | ICD-10-CM | POA: Diagnosis not present

## 2023-12-20 DIAGNOSIS — R92321 Mammographic fibroglandular density, right breast: Secondary | ICD-10-CM | POA: Diagnosis not present

## 2023-12-20 HISTORY — PX: BREAST BIOPSY: SHX20

## 2023-12-21 LAB — SURGICAL PATHOLOGY

## 2023-12-22 ENCOUNTER — Ambulatory Visit: Admitting: Family Medicine

## 2023-12-22 VITALS — BP 120/80 | HR 107 | Temp 99.3°F | Ht 60.0 in | Wt 119.4 lb

## 2023-12-22 DIAGNOSIS — F419 Anxiety disorder, unspecified: Secondary | ICD-10-CM | POA: Diagnosis not present

## 2023-12-22 DIAGNOSIS — D0510 Intraductal carcinoma in situ of unspecified breast: Secondary | ICD-10-CM | POA: Insufficient documentation

## 2023-12-22 DIAGNOSIS — R059 Cough, unspecified: Secondary | ICD-10-CM | POA: Diagnosis not present

## 2023-12-22 DIAGNOSIS — D0511 Intraductal carcinoma in situ of right breast: Secondary | ICD-10-CM | POA: Diagnosis not present

## 2023-12-22 LAB — POC COVID19 BINAXNOW: SARS Coronavirus 2 Ag: NEGATIVE

## 2023-12-22 LAB — POCT INFLUENZA A/B
Influenza A, POC: NEGATIVE
Influenza B, POC: NEGATIVE

## 2023-12-22 MED ORDER — AMOXICILLIN-POT CLAVULANATE 875-125 MG PO TABS: 1.0000 | ORAL_TABLET | Freq: Two times a day (BID) | ORAL | 0 refills | Status: DC

## 2023-12-22 MED ORDER — AZELASTINE HCL 0.1 % NA SOLN
2.0000 | Freq: Two times a day (BID) | NASAL | 12 refills | Status: DC
Start: 2023-12-22 — End: 2024-01-04

## 2023-12-22 NOTE — Assessment & Plan Note (Signed)
 Recently diagnosed.  She has upcoming lumpectomy.  Will defer further management to surgery and oncology.  Overall she feels like she is handling as well as can be expected.  She will let us know if she needs any further assistance.

## 2023-12-22 NOTE — Assessment & Plan Note (Signed)
 Overall stable despite recent DCIS diagnosis.  She is on Prozac 40 mg daily and alprazolam 0.5 mg nightly as needed.  Does not need refill today.  She will let us know if she needs referral to see a therapist.

## 2023-12-22 NOTE — Patient Instructions (Signed)
 It was very nice to see you today!  I think you are probably developing a sinus infection.  Please start the Astelin.  Start the Augmentin if not improving in the next few days.  Please make sure that you are getting plenty of fluid and staying hydrated.  Return if symptoms worsen or fail to improve.   Take care, Dr Jimmey Ralph  PLEASE NOTE:  If you had any lab tests, please let us know if you have not heard back within a few days. You may see your results on mychart before we have a chance to review them but we will give you a call once they are reviewed by [place].   If we ordered any referrals today, please let us know if you have not heard from their office within the next week.   If you had any urgent prescriptions sent in today, please check with the pharmacy within an hour of our visit to make sure the prescription was transmitted appropriately.   Please try these tips to maintain a healthy lifestyle:  Eat at least 3 REAL meals and 1-2 snacks per day.  Aim for no more than 5 hours between eating.  If you eat breakfast, please do so within one hour of getting up.   Each meal should contain half fruits/vegetables, one quarter protein, and one quarter carbs (no bigger than a computer mouse)  Cut down on sweet beverages. This includes juice, soda, and sweet tea.   Drink at least 1 glass of water with each meal and aim for at least 8 glasses per day  Exercise at least 150 minutes every week.

## 2023-12-22 NOTE — Progress Notes (Signed)
   Misty Bullock is a 68 y.o. female who presents today for an office visit.  Assessment/Plan:  New/Acute Problems: Sinusitis Flu and COVID test negative.  No red flags.  Overall reassuring exam.  Will start Astelin nasal spray.  Also send in pocket prescription for Augmentin with instruction not start unless symptoms worsen or fail to improve the next few days.  She can continue over-the-counter medications as needed.  Encouraged hydration.  We discussed reasons to return to care.  Follow-up as needed.  Chronic Problems Addressed Today: DCIS (ductal carcinoma in situ) Recently diagnosed.  She has upcoming lumpectomy.  Will defer further management to surgery and oncology.  Overall she feels like she is handling as well as can be expected.  She will let us know if she needs any further assistance.  Anxiety Overall stable despite recent DCIS diagnosis.  She is on Prozac 40 mg daily and alprazolam 0.5 mg nightly as needed.  Does not need refill today.  She will let us know if she needs referral to see a therapist.     Subjective:  HPI:  See A/P for status of chronic conditions.  Patient is here today with cough and nasal congestion.  Started about 4 days ago. Tried OTC allergy medications which has not helped. Some low grade fevers. No body aches. No chest pain or shortness of breath. Symptoms are getting worse the last few days.   She was also diagnosed with DCIS since her last visit.  This was diagnosed within the last couple of weeks.  This has been a large source of stress for her.  Overall she does feel like she is handling it well.  She has upcoming lumpectomy next week.        Objective:  Physical Exam: BP 120/80   Pulse (!) 107   Temp 99.3 F (37.4 C) (Temporal)   Ht 5' (1.524 m)   Wt 119 lb 6.4 oz (54.2 kg)   LMP  (LMP Unknown)   SpO2 97%   BMI 23.32 kg/m   Gen: No acute distress, resting comfortably HEENT: TMs clear.  OP clear.  Nares becomes erythematous and boggy  bilaterally. CV: Regular rate and rhythm with no murmurs appreciated Pulm: Normal work of breathing, clear to auscultation bilaterally with no crackles, wheezes, or rhonchi Neuro: Grossly normal, moves all extremities Psych: Normal affect and thought content      Aarron Wierzbicki M. Jimmey Ralph, MD 12/22/2023 2:41 PM

## 2023-12-25 ENCOUNTER — Other Ambulatory Visit: Payer: Self-pay | Admitting: *Deleted

## 2023-12-25 ENCOUNTER — Other Ambulatory Visit: Payer: Self-pay

## 2023-12-25 ENCOUNTER — Telehealth: Payer: Self-pay | Admitting: *Deleted

## 2023-12-25 MED ORDER — AZITHROMYCIN 250 MG PO TABS: ORAL_TABLET | ORAL | 0 refills | Status: AC

## 2023-12-25 NOTE — Telephone Encounter (Signed)
 Ok to send in zpack.  Katina Degree. Jimmey Ralph, MD 12/25/2023 12:35 PM

## 2023-12-25 NOTE — Telephone Encounter (Signed)
 Rx send

## 2023-12-25 NOTE — Telephone Encounter (Signed)
 Copied from CRM [id number]. Topic: Clinical - Prescription Issue >> Dec 25, 2023 10:48 AM Pascal Lux wrote: Reason for CRM: Patient stated the medication amoxicillin-clavulanate (AUGMENTIN) 875-125 MG tablet [[id number]] and is basically the same as Augmentin, which tears her stomach up. She is requesting another type if possible.   Please advise Francena Hanly, RMA

## 2023-12-27 ENCOUNTER — Other Ambulatory Visit: Payer: Self-pay | Admitting: Family Medicine

## 2023-12-29 ENCOUNTER — Ambulatory Visit: Payer: Self-pay | Admitting: General Surgery

## 2023-12-29 DIAGNOSIS — D0511 Intraductal carcinoma in situ of right breast: Secondary | ICD-10-CM | POA: Diagnosis not present

## 2023-12-29 MED ORDER — KETOROLAC TROMETHAMINE 15 MG/ML IJ SOLN: 15.0000 mg | INTRAMUSCULAR | Status: AC

## 2023-12-30 ENCOUNTER — Other Ambulatory Visit: Payer: Self-pay | Admitting: Family Medicine

## 2024-01-01 ENCOUNTER — Other Ambulatory Visit: Payer: Self-pay | Admitting: General Surgery

## 2024-01-01 ENCOUNTER — Ambulatory Visit: Admitting: Family Medicine

## 2024-01-01 ENCOUNTER — Encounter: Payer: Self-pay | Admitting: Family Medicine

## 2024-01-01 VITALS — BP 107/70 | HR 101 | Temp 97.3°F | Ht 60.0 in | Wt 117.4 lb

## 2024-01-01 DIAGNOSIS — D0511 Intraductal carcinoma in situ of right breast: Secondary | ICD-10-CM | POA: Diagnosis not present

## 2024-01-01 DIAGNOSIS — J329 Chronic sinusitis, unspecified: Secondary | ICD-10-CM | POA: Diagnosis not present

## 2024-01-01 DIAGNOSIS — F419 Anxiety disorder, unspecified: Secondary | ICD-10-CM

## 2024-01-01 MED ORDER — DOXYCYCLINE HYCLATE 100 MG PO TABS: 100.0000 mg | ORAL_TABLET | Freq: Two times a day (BID) | ORAL | 0 refills | Status: DC

## 2024-01-01 MED ORDER — PROMETHAZINE-DM 6.25-15 MG/5ML PO SYRP: 5.0000 mL | ORAL_SOLUTION | Freq: Four times a day (QID) | ORAL | 0 refills | Status: DC | PRN

## 2024-01-01 MED ORDER — PREDNISONE 20 MG PO TABS
20.0000 mg | ORAL_TABLET | Freq: Every day | ORAL | 0 refills | Status: DC
Start: 2024-01-01 — End: 2024-01-12

## 2024-01-01 NOTE — Assessment & Plan Note (Signed)
 Has appointment with surgery in a couple of weeks for lumpectomy.

## 2024-01-01 NOTE — Progress Notes (Signed)
   Misty Bullock is a 68 y.o. female who presents today for an office visit.  Assessment/Plan:  New/Acute Problems: Sinusitis  Modestly improved since our last visit.  No red flag signs or symptoms.  Overall reassuring exam.  Will send in prescription for promethazine dextromethorphan cough syrup.  She can also continue Astelin nasal spray and also try Flonase.  We will try few days of prednisone.  We discussed potential side effects.  Will also send in pocket prescription for doxycycline with instruction to not start unless symptoms fail to improve over the next several days.  If still no improvement with this would consider referral to ENT.  Chronic Problems Addressed Today: DCIS (ductal carcinoma in situ) Has appointment with surgery in a couple of weeks for lumpectomy.   Anxiety Stable despite recent DCIS diagnosis.  Overall feels like she is handling the news well.  She is currently on Prozac 40 mg daily and alprazolam 0.5 mg daily as needed.  We refilled her alprazolam today.  She tolerates well.     Subjective:  HPI:  See A/P for status of chronic conditions.  Patient is here today for follow-up.  I saw her 10 days ago.  She was diagnosed with sinusitis.  She was started on Astelin.  She took Augmentin which caused vomiting. We switched to azithromycin. She finished 5 days of azithromycin. Still having a lot of cough and congestion.She did try some OTC (available over the counter without a prescription) medications which has helped.  The medicine for wounds were Mucinex and Mucinex DM.  No fevers or chills.  Still has a lot of sinus congestion.  Still nagging cough.  No shortness of breath.       Objective:  Physical Exam: BP 107/70   Pulse (!) 101   Temp (!) 97.3 F (36.3 C) (Temporal)   Ht 5' (1.524 m)   Wt 117 lb 6.4 oz (53.3 kg)   LMP  (LMP Unknown)   SpO2 98%   BMI 22.93 kg/m   Gen: No acute distress, resting comfortably HEENT: TMs with clear effusion.  OP clear.  Nasal  mucosa erythematous and boggy bilaterally. CV: Regular rate and rhythm with no murmurs appreciated Pulm: Normal work of breathing, clear to auscultation bilaterally with no crackles, wheezes, or rhonchi Neuro: Grossly normal, moves all extremities Psych: Normal affect and thought content      [place] M. Jimmey Ralph, MD 01/01/2024 12:05 PM

## 2024-01-01 NOTE — Assessment & Plan Note (Signed)
 Stable despite recent DCIS diagnosis.  Overall feels like she is handling the news well.  She is currently on Prozac 40 mg daily and alprazolam 0.5 mg daily as needed.  We refilled her alprazolam today.  She tolerates well.

## 2024-01-03 NOTE — Progress Notes (Signed)
 New Breast Cancer Diagnosis: Right Breast  Patient was found to have a 1.9 cm area of calcification in the medial retro-areolar right breast on screening mammogram.  Histology per Pathology Report: grade 3, DCIS  Receptor Status: ER(positive), PR (positive), Her2-neu (), Ki-(%)   Surgeon and surgical plan, if any:  Dr. Carolynne Edouard -Breast Lumpectomy with radioactive seed localization 01/12/2024   Medical oncologist, treatment if any:     Family History of Breast/Ovarian/Prostate Cancer: No  Lymphedema issues, if any: No      Pain issues, if any: No    SAFETY ISSUES: Prior radiation? No Pacemaker/ICD? No Possible current pregnancy? Postmenopausal Is the patient on methotrexate? No  Current Complaints / other details:

## 2024-01-04 ENCOUNTER — Ambulatory Visit
Admission: RE | Admit: 2024-01-04 | Discharge: 2024-01-04 | Disposition: A | Discharge status: Home / Self Care | Source: Ambulatory Visit | Attending: Radiation Oncology | Admitting: Radiation Oncology

## 2024-01-04 ENCOUNTER — Encounter: Payer: Self-pay | Admitting: Radiation Oncology

## 2024-01-04 ENCOUNTER — Encounter (HOSPITAL_BASED_OUTPATIENT_CLINIC_OR_DEPARTMENT_OTHER): Payer: Self-pay | Admitting: General Surgery

## 2024-01-04 ENCOUNTER — Other Ambulatory Visit: Payer: Self-pay

## 2024-01-04 VITALS — BP 124/94 | HR 101 | Temp 97.3°F | Resp 18 | Ht 66.0 in | Wt 117.4 lb

## 2024-01-04 DIAGNOSIS — Z170 Estrogen receptor positive status [ER+]: Secondary | ICD-10-CM | POA: Diagnosis not present

## 2024-01-04 DIAGNOSIS — G4700 Insomnia, unspecified: Secondary | ICD-10-CM | POA: Diagnosis not present

## 2024-01-04 DIAGNOSIS — G2581 Restless legs syndrome: Secondary | ICD-10-CM | POA: Diagnosis not present

## 2024-01-04 DIAGNOSIS — D0511 Intraductal carcinoma in situ of right breast: Secondary | ICD-10-CM | POA: Diagnosis not present

## 2024-01-04 DIAGNOSIS — Z79899 Other long term (current) drug therapy: Secondary | ICD-10-CM | POA: Diagnosis not present

## 2024-01-04 DIAGNOSIS — Z7952 Long term (current) use of systemic steroids: Secondary | ICD-10-CM | POA: Diagnosis not present

## 2024-01-04 NOTE — Progress Notes (Signed)
 Radiation Oncology         [phone] ________________________________  Name: Misty Bullock        MRN: [id number]  Date of Service: 01/04/2024 DOB: 14-Nov-1955  MV:HQIONG, Katina Degree, MD  Griselda Miner, MD     REFERRING PHYSICIAN: Chevis Pretty III, MD   DIAGNOSIS: There were no encounter diagnoses.   HISTORY OF PRESENT ILLNESS: Misty Bullock is a 68 y.o. female seen at the request of Dr. Carolynne Edouard for a new diagnosis of right breast cancer. The patient was noted to have screening detected calcifications. She underwent diagnostic mammogram on 12/08/23 that confirmed 1.9 cm calcifications in the medial retroareolar right breast. She underwent a stereotactic biopsy on 12/20/23 that showed high grade DCIS that was ER/PR positive. She met with Dr. Carolynne Edouard and is scheduled for lumpectomy on 01/12/24. She's seen to consider adjuvant radiotherapy to the breast.    PREVIOUS RADIATION THERAPY: No   PAST MEDICAL HISTORY:  Past Medical History:  Diagnosis Date   Anxiety    History of chicken pox    Insomnia    Restless leg syndrome        PAST SURGICAL HISTORY: Past Surgical History:  Procedure Laterality Date   bilateral blerphoplasty      BREAST BIOPSY Right 12/20/2023   MM RT BREAST BX W LOC DEV 1ST LESION IMAGE BX SPEC STEREO GUIDE 12/20/2023 [facility]   cataract surgery      OPEN REDUCTION INTERNAL FIXATION (ORIF) DISTAL RADIAL FRACTURE Left 03/03/2017   Procedure: OPEN REDUCTION INTERNAL FIXATION (ORIF) LEFT DISTAL RADIUS FRACTURE;  Surgeon: Kathryne Hitch, MD;  Location: [facility];  Service: Orthopedics;  Laterality: Left;   WISDOM TOOTH EXTRACTION       FAMILY HISTORY: No family history on file.   SOCIAL HISTORY:  reports that she has never smoked. She has never used smokeless tobacco. She reports current alcohol use. She reports that she does not use drugs. The patient is single and lives in [place]. She cares for her 93 year old mother who has recently fractured her femur  and in a skilled facility. 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    ALLERGIES: Latex and Augmentin [amoxicillin-pot clavulanate]   MEDICATIONS:  Current Outpatient Medications  Medication Sig Dispense Refill   ALPRAZolam (XANAX) 0.5 MG tablet TAKE 1 TABLET BY MOUTH AT BEDTIME AS NEEDED FOR ANXIETY 30 tablet 5   azelastine (ASTELIN) 0.1 % nasal spray Place 2 sprays into both nostrils 2 (two) times daily. 30 mL 12   denosumab (PROLIA) 60 MG/ML SOSY injection Prolia 60 mg/mL subcutaneous syringe  Dispense 1 prefilled syringe to MD office for SQ injection Q 6 months     doxycycline (VIBRA-TABS) 100 MG tablet Take 1 tablet (100 mg total) by mouth 2 (two) times daily. 14 tablet 0   FLUoxetine (PROZAC) 40 MG capsule TAKE 1 CAPSULE (40 MG TOTAL) BY MOUTH DAILY. 90 capsule 0   predniSONE (DELTASONE) 20 MG tablet Take 1 tablet (20 mg total) by mouth daily with breakfast. 5 tablet 0   promethazine-dextromethorphan (PROMETHAZINE-DM) 6.25-15 MG/5ML syrup Take 5 mLs by mouth 4 (four) times daily as needed. 118 mL 0   valACYclovir (VALTREX) 500 MG tablet valacyclovir 500 mg tablet     zolpidem (AMBIEN) 10 MG tablet TAKE 1/2 TABLET BY MOUTH EVERY DAY AT BEDTIME 45 tablet 1   No current facility-administered medications for this visit.     REVIEW OF SYSTEMS:  On review of systems, the patient reports that she is doing well overall. No breast specific complaints are verbalized.      PHYSICAL EXAM:  Wt Readings from Last 3 Encounters:  01/01/24 117 lb 6.4 oz (53.3 kg)  12/22/23 119 lb 6.4 oz (54.2 kg)  08/31/23 118 lb 6.4 oz (53.7 kg)   Temp Readings from Last 3 Encounters:  01/01/24 (!) 97.3 F (36.3 C) (Temporal)  12/22/23 99.3 F (37.4 C) (Temporal)  08/31/23 98.2 F (36.8 C) (Temporal)   BP Readings from Last 3 Encounters:  01/01/24 107/70  12/22/23 120/80  08/31/23 117/83   Pulse Readings from Last 3 Encounters:  01/01/24 (!) 101  12/22/23 (!) 107   08/31/23 81    In general this is a well appearing caucasian female in no acute distress. She's alert and oriented x4 and appropriate throughout the examination. Cardiopulmonary assessment is negative for acute distress and she exhibits normal effort. Bilateral breast exam is deferred.    ECOG = 0  0 - Asymptomatic (Fully active, able to carry on all predisease activities without restriction)  1 - Symptomatic but completely ambulatory (Restricted in physically strenuous activity but ambulatory and able to carry out work of a light or sedentary nature. For example, light housework, office work)  2 - Symptomatic, <50% in bed during the day (Ambulatory and capable of all self care but unable to carry out any work activities. Up and about more than 50% of waking hours)  3 - Symptomatic, >50% in bed, but not bedbound (Capable of only limited self-care, confined to bed or chair 50% or more of waking hours)  4 - Bedbound (Completely disabled. Cannot carry on any self-care. Totally confined to bed or chair)  5 - Death   Santiago Glad MM, Creech RH, Tormey DC, et al. [phone]). "Toxicity and response criteria of the [facility] Group". Am. Evlyn Clines. Oncol. 5 (6): 649-55    LABORATORY DATA:  Lab Results  Component Value Date   WBC 4.5 08/31/2023   HGB 15.7 (H) 08/31/2023   HCT 47.1 (H) 08/31/2023   MCV 99.6 08/31/2023   PLT 244.0 08/31/2023   Lab Results  Component Value Date   NA 136 08/31/2023   K 4.5 08/31/2023   CL 97 08/31/2023   CO2 28 08/31/2023   Lab Results  Component Value Date   ALT 18 08/31/2023   AST 37 08/31/2023   ALKPHOS 56 08/31/2023   BILITOT 0.5 08/31/2023      RADIOGRAPHY: MM RT BREAST BX W LOC DEV 1ST LESION IMAGE BX SPEC STEREO GUIDE Addendum Date: 12/22/2023 ADDENDUM REPORT: 12/22/2023 10:35 ADDENDUM: Pathology revealed DUCTAL CARCINOMA IN SITU, HIGH NUCLEAR GRADE, NECROSIS: PRESENT, CALCIFICATIONS: PRESENT of the RIGHT breast, INNER RETROAREOLAR,  (coil clip). This was found to be concordant by Dr. Laveda Abbe. Pathology results were discussed with the patient by telephone. The patient reported doing well after the biopsy with tenderness at the site. Post biopsy instructions and care were reviewed and questions were answered. The patient was encouraged to call [facility] for any additional concerns. My direct phone number was provided. Surgical consultation has been arranged with Dr. Chevis Pretty at [facility] on December 29, 2023. Consideration for a bilateral breast MRI for further evaluation of extent of disease given the high grade histology. Pathology results reported by Rene Kocher, RN on 12/22/2023. Electronically Signed   By: Harmon Pier M.D.   On: 12/22/2023 10:35   Result  Date: 12/22/2023 CLINICAL DATA:  68 year old female presents for tissue sampling of INNER RETROAREOLAR RIGHT breast calcifications. EXAM: RIGHT BREAST STEREOTACTIC CORE NEEDLE BIOPSY COMPARISON:  Previous exam(s). FINDINGS: The patient and I discussed the procedure of stereotactic-guided biopsy including benefits and alternatives. We discussed the high likelihood of a successful procedure. We discussed the risks of the procedure including infection, bleeding, tissue injury, clip migration, and inadequate sampling. Informed written consent was given. The usual time out protocol was performed immediately prior to the procedure. Using sterile technique and 1% Lidocaine with and without epinephrine as local anesthetic, under stereotactic guidance, a 9 gauge vacuum assisted device was used to perform core needle biopsy of the 1.9 cm group of INNER RETROAREOLAR RIGHT breast calcifications using a SUPERIOR approach. Specimen radiograph was performed showing calcifications. Specimens with calcifications are identified for pathology. At the conclusion of the procedure, a COIL shaped tissue marker clip was deployed into the biopsy cavity. Follow-up 2-view  mammogram was performed and dictated separately. IMPRESSION: Stereotactic-guided biopsy of 1.9 cm group of INNER RETROAREOLAR RIGHT breast calcifications. No apparent complications. Electronically Signed: By: Harmon Pier M.D. On: 12/20/2023 11:57   MM CLIP PLACEMENT RIGHT Result Date: 12/20/2023 CLINICAL DATA:  Evaluate COIL biopsy clip placement following stereotactic guided RIGHT breast biopsy. EXAM: 3D DIAGNOSTIC RIGHT MAMMOGRAM POST STEREOTACTIC BIOPSY COMPARISON:  Previous exam(s). ACR Breast Density Category b: There are scattered areas of fibroglandular density. FINDINGS: 3D Mammographic images were obtained following stereotactic guided biopsy of 1.9 cm group of INNER RETROAREOLAR RIGHT breast calcifications. The biopsy marking clip is in expected position at the site of biopsy. IMPRESSION: Appropriate positioning of the COIL shaped biopsy marking clip at the site of biopsy in the INNER RETROAREOLAR RIGHT breast. Final Assessment: Post Procedure Mammograms for Marker Placement Electronically Signed   By: Harmon Pier M.D.   On: 12/20/2023 12:03   MM Digital Diagnostic Unilat R Result Date: 12/08/2023 CLINICAL DATA:  Recall from screening for right breast calcifications. EXAM: DIGITAL DIAGNOSTIC UNILATERAL RIGHT MAMMOGRAM TECHNIQUE: Right digital diagnostic mammography was performed. COMPARISON:  Previous exam(s). ACR Breast Density Category b: There are scattered areas of fibroglandular density. FINDINGS: On the right, there is a group of calcifications in the medial retroareolar breast, anterior to middle depth, spanning approximately 1.9 x 1.1 x 1.2 cm. Calcifications mostly small/punctate, several coarse and heterogeneous. There is no associated mass or distortion and no linearity or branching. IMPRESSION: 1. Indeterminate calcifications in the medial retroareolar right breast spanning a maximum of 1.9 cm. Tissue sampling is recommended. RECOMMENDATION: 1. Stereotactic core needle biopsy of the right  breast calcifications. I have discussed the findings and recommendations with the patient. If applicable, a reminder letter will be sent to the patient regarding the next appointment. BI-RADS CATEGORY  4: Suspicious. Electronically Signed   By: Amie Portland M.D.   On: 12/08/2023 16:13       IMPRESSION/PLAN: 1. High Grade, ER/PR positive DCIS of the right breast. Dr. Mitzi Hansen discusses the pathology findings and reviews the nature of noninvasive breast disease. The consensus from the breast conference includes breast conservation with lumpectomy. Dr. Mitzi Hansen recommends external radiotherapy to the breast  to reduce risks of local recurrence. We anticipate that medical oncology will recommend adjuvant antiestrogen therapy to follow. We discussed the risks, benefits, short, and long term effects of radiotherapy, as well as the curative intent, and the patient is interested in proceeding. Dr. Mitzi Hansen discusses the delivery and logistics of radiotherapy and anticipates a course of 4 weeks  of radiotherapy to the right breast. We will see her back a few weeks after surgery to discuss the simulation process and anticipate we starting radiotherapy about 4-6 weeks after surgery.      In a visit lasting 60 minutes, greater than 50% of the time was spent face to face reviewing her case, as well as in preparation of, discussing, and coordinating the patient's care.  The above documentation reflects my direct findings during this shared patient visit. Please see the separate note by Dr. Mitzi Hansen on this date for the remainder of the patient's plan of care.    Osker Mason, [facility]    **Disclaimer: This note was dictated with voice recognition software. Similar sounding words can inadvertently be transcribed and this note may contain transcription errors which may not have been corrected upon publication of note.**

## 2024-01-10 ENCOUNTER — Other Ambulatory Visit: Payer: Self-pay | Admitting: Family Medicine

## 2024-01-11 ENCOUNTER — Ambulatory Visit
Admission: RE | Admit: 2024-01-11 | Discharge: 2024-01-11 | Disposition: A | Discharge status: Home / Self Care | Source: Ambulatory Visit | Attending: General Surgery | Admitting: General Surgery

## 2024-01-11 DIAGNOSIS — D0511 Intraductal carcinoma in situ of right breast: Secondary | ICD-10-CM

## 2024-01-11 HISTORY — PX: BREAST BIOPSY: SHX20

## 2024-01-11 MED ORDER — CHLORHEXIDINE GLUCONATE CLOTH 2 % EX PADS: 6.0000 | MEDICATED_PAD | Freq: Once | CUTANEOUS | Status: DC

## 2024-01-11 NOTE — Progress Notes (Signed)

## 2024-01-12 ENCOUNTER — Encounter (HOSPITAL_BASED_OUTPATIENT_CLINIC_OR_DEPARTMENT_OTHER): Payer: Self-pay | Admitting: General Surgery

## 2024-01-12 ENCOUNTER — Ambulatory Visit
Admission: RE | Admit: 2024-01-12 | Discharge: 2024-01-12 | Disposition: A | Discharge status: Home / Self Care | Source: Ambulatory Visit | Attending: General Surgery | Admitting: General Surgery

## 2024-01-12 ENCOUNTER — Ambulatory Visit (HOSPITAL_BASED_OUTPATIENT_CLINIC_OR_DEPARTMENT_OTHER): Admitting: Anesthesiology

## 2024-01-12 ENCOUNTER — Other Ambulatory Visit: Payer: Self-pay

## 2024-01-12 ENCOUNTER — Ambulatory Visit (HOSPITAL_BASED_OUTPATIENT_CLINIC_OR_DEPARTMENT_OTHER)
Admission: RE | Admit: 2024-01-12 | Discharge: 2024-01-12 | Disposition: A | Discharge status: Home / Self Care | Attending: General Surgery | Admitting: General Surgery

## 2024-01-12 ENCOUNTER — Encounter (HOSPITAL_BASED_OUTPATIENT_CLINIC_OR_DEPARTMENT_OTHER)
Admission: RE | Disposition: A | Discharge status: Home / Self Care | Payer: Self-pay | Source: Home / Self Care | Attending: General Surgery

## 2024-01-12 DIAGNOSIS — Z170 Estrogen receptor positive status [ER+]: Secondary | ICD-10-CM | POA: Insufficient documentation

## 2024-01-12 DIAGNOSIS — F419 Anxiety disorder, unspecified: Secondary | ICD-10-CM | POA: Insufficient documentation

## 2024-01-12 DIAGNOSIS — Z1721 Progesterone receptor positive status: Secondary | ICD-10-CM | POA: Diagnosis not present

## 2024-01-12 DIAGNOSIS — Z79624 Long term (current) use of inhibitors of nucleotide synthesis: Secondary | ICD-10-CM | POA: Diagnosis not present

## 2024-01-12 DIAGNOSIS — C50211 Malignant neoplasm of upper-inner quadrant of right female breast: Secondary | ICD-10-CM | POA: Diagnosis not present

## 2024-01-12 DIAGNOSIS — N6091 Unspecified benign mammary dysplasia of right breast: Secondary | ICD-10-CM | POA: Diagnosis not present

## 2024-01-12 DIAGNOSIS — N641 Fat necrosis of breast: Secondary | ICD-10-CM | POA: Diagnosis not present

## 2024-01-12 DIAGNOSIS — G2581 Restless legs syndrome: Secondary | ICD-10-CM | POA: Diagnosis not present

## 2024-01-12 DIAGNOSIS — Z79899 Other long term (current) drug therapy: Secondary | ICD-10-CM | POA: Diagnosis not present

## 2024-01-12 DIAGNOSIS — C50911 Malignant neoplasm of unspecified site of right female breast: Secondary | ICD-10-CM | POA: Diagnosis not present

## 2024-01-12 DIAGNOSIS — D0511 Intraductal carcinoma in situ of right breast: Secondary | ICD-10-CM

## 2024-01-12 HISTORY — PX: BREAST LUMPECTOMY WITH RADIOACTIVE SEED LOCALIZATION: SHX6424

## 2024-01-12 SURGERY — BREAST LUMPECTOMY WITH RADIOACTIVE SEED LOCALIZATION
Anesthesia: General | Site: Breast | Laterality: Right

## 2024-01-12 MED ORDER — LIDOCAINE 2% (20 MG/ML) 5 ML SYRINGE
INTRAMUSCULAR | Status: DC | PRN
  Administered 2024-01-12: 50 mg via INTRAVENOUS

## 2024-01-12 MED ORDER — EPHEDRINE SULFATE-NACL 50-0.9 MG/10ML-% IV SOSY
PREFILLED_SYRINGE | INTRAVENOUS | Status: DC | PRN
  Administered 2024-01-12: 10 mg via INTRAVENOUS

## 2024-01-12 MED ORDER — BUPIVACAINE-EPINEPHRINE (PF) 0.25% -1:200000 IJ SOLN
INTRAMUSCULAR | Status: DC | PRN
  Administered 2024-01-12: 20 mL

## 2024-01-12 MED ORDER — DEXAMETHASONE SODIUM PHOSPHATE 10 MG/ML IJ SOLN
INTRAMUSCULAR | Status: DC | PRN
  Administered 2024-01-12: 10 mg via INTRAVENOUS

## 2024-01-12 MED ORDER — ONDANSETRON HCL 4 MG/2ML IJ SOLN
INTRAMUSCULAR | Status: AC
Start: 2024-01-12 — End: ?
  Filled 2024-01-12: qty 2

## 2024-01-12 MED ORDER — ONDANSETRON HCL 4 MG/2ML IJ SOLN: 4.0000 mg | Freq: Once | INTRAMUSCULAR | Status: DC | PRN

## 2024-01-12 MED ORDER — 0.9 % SODIUM CHLORIDE (POUR BTL) OPTIME
TOPICAL | Status: DC | PRN
  Administered 2024-01-12: 500 mL

## 2024-01-12 MED ORDER — ACETAMINOPHEN 500 MG PO TABS
1000.0000 mg | ORAL_TABLET | ORAL | Status: AC
  Administered 2024-01-12: 1000 mg via ORAL

## 2024-01-12 MED ORDER — DEXAMETHASONE SODIUM PHOSPHATE 10 MG/ML IJ SOLN
INTRAMUSCULAR | Status: AC
  Filled 2024-01-12: qty 1

## 2024-01-12 MED ORDER — OXYCODONE HCL 5 MG/5ML PO SOLN: 5.0000 mg | Freq: Once | ORAL | Status: DC | PRN

## 2024-01-12 MED ORDER — LIDOCAINE 2% (20 MG/ML) 5 ML SYRINGE
INTRAMUSCULAR | Status: AC
  Filled 2024-01-12: qty 5

## 2024-01-12 MED ORDER — ONDANSETRON HCL 4 MG/2ML IJ SOLN
INTRAMUSCULAR | Status: DC | PRN
  Administered 2024-01-12: 4 mg via INTRAVENOUS

## 2024-01-12 MED ORDER — MIDAZOLAM HCL 2 MG/2ML IJ SOLN
INTRAMUSCULAR | Status: AC
  Filled 2024-01-12: qty 2

## 2024-01-12 MED ORDER — MIDAZOLAM HCL 5 MG/5ML IJ SOLN
INTRAMUSCULAR | Status: DC | PRN
  Administered 2024-01-12: 2 mg via INTRAVENOUS

## 2024-01-12 MED ORDER — CEFAZOLIN SODIUM-DEXTROSE 2-4 GM/100ML-% IV SOLN
INTRAVENOUS | Status: AC
Start: 2024-01-12 — End: ?
  Filled 2024-01-12: qty 100

## 2024-01-12 MED ORDER — OXYCODONE HCL 5 MG PO TABS: 5.0000 mg | ORAL_TABLET | Freq: Once | ORAL | Status: DC | PRN

## 2024-01-12 MED ORDER — EPHEDRINE 5 MG/ML INJ
INTRAVENOUS | Status: AC
  Filled 2024-01-12: qty 5

## 2024-01-12 MED ORDER — FENTANYL CITRATE (PF) 100 MCG/2ML IJ SOLN
INTRAMUSCULAR | Status: AC
  Filled 2024-01-12: qty 2

## 2024-01-12 MED ORDER — OXYCODONE HCL 5 MG PO TABS
5.0000 mg | ORAL_TABLET | Freq: Four times a day (QID) | ORAL | 0 refills | Status: DC | PRN
Start: 2024-01-12 — End: 2024-03-13

## 2024-01-12 MED ORDER — PROPOFOL 10 MG/ML IV BOLUS
INTRAVENOUS | Status: DC | PRN
Start: 2024-01-12 — End: 2024-01-12
  Administered 2024-01-12: 150 mg via INTRAVENOUS

## 2024-01-12 MED ORDER — HYDROMORPHONE HCL 1 MG/ML IJ SOLN: 0.2500 mg | INTRAMUSCULAR | Status: DC | PRN

## 2024-01-12 MED ORDER — GABAPENTIN 100 MG PO CAPS
100.0000 mg | ORAL_CAPSULE | ORAL | Status: AC
  Administered 2024-01-12: 100 mg via ORAL

## 2024-01-12 MED ORDER — FENTANYL CITRATE (PF) 100 MCG/2ML IJ SOLN
INTRAMUSCULAR | Status: DC | PRN
  Administered 2024-01-12 (×2): 50 ug via INTRAVENOUS

## 2024-01-12 MED ORDER — PHENYLEPHRINE 80 MCG/ML (10ML) SYRINGE FOR IV PUSH (FOR BLOOD PRESSURE SUPPORT)
PREFILLED_SYRINGE | INTRAVENOUS | Status: AC
  Filled 2024-01-12: qty 10

## 2024-01-12 MED ORDER — LACTATED RINGERS IV SOLN: INTRAVENOUS | Status: DC

## 2024-01-12 MED ORDER — CEFAZOLIN SODIUM-DEXTROSE 2-4 GM/100ML-% IV SOLN
2.0000 g | INTRAVENOUS | Status: AC
  Administered 2024-01-12: 2 g via INTRAVENOUS

## 2024-01-12 MED ORDER — PHENYLEPHRINE 80 MCG/ML (10ML) SYRINGE FOR IV PUSH (FOR BLOOD PRESSURE SUPPORT)
PREFILLED_SYRINGE | INTRAVENOUS | Status: DC | PRN
  Administered 2024-01-12 (×4): 160 ug via INTRAVENOUS

## 2024-01-12 MED ORDER — AMISULPRIDE (ANTIEMETIC) 5 MG/2ML IV SOLN: 10.0000 mg | Freq: Once | INTRAVENOUS | Status: DC | PRN

## 2024-01-12 MED ORDER — ACETAMINOPHEN 500 MG PO TABS
ORAL_TABLET | ORAL | Status: AC
Start: 2024-01-12 — End: ?
  Filled 2024-01-12: qty 2

## 2024-01-12 MED ORDER — GABAPENTIN 100 MG PO CAPS
ORAL_CAPSULE | ORAL | Status: AC
Start: 2024-01-12 — End: ?
  Filled 2024-01-12: qty 1

## 2024-01-12 SURGICAL SUPPLY — 34 items
APPLIER CLIP 9.375 MED OPEN (MISCELLANEOUS) IMPLANT
BLADE SURG 15 STRL LF DISP TIS (BLADE) ×1 IMPLANT
CANISTER SUC SOCK COL 7IN (MISCELLANEOUS) ×1 IMPLANT
CANISTER SUCT 1200ML W/VALVE (MISCELLANEOUS) ×1 IMPLANT
CHLORAPREP W/TINT 26 (MISCELLANEOUS) ×1 IMPLANT
CLIP APPLIE 9.375 MED OPEN (MISCELLANEOUS) IMPLANT
COVER BACK TABLE 60X90IN (DRAPES) ×1 IMPLANT
COVER MAYO STAND STRL (DRAPES) ×1 IMPLANT
COVER PROBE CYLINDRICAL 5X96 (MISCELLANEOUS) ×1 IMPLANT
DERMABOND ADVANCED .7 DNX12 (GAUZE/BANDAGES/DRESSINGS) ×1 IMPLANT
DRAPE LAPAROSCOPIC ABDOMINAL (DRAPES) ×1 IMPLANT
DRAPE UTILITY XL STRL (DRAPES) ×1 IMPLANT
ELECT COATED BLADE 2.86 ST (ELECTRODE) ×1 IMPLANT
ELECT REM PT RETURN 9FT ADLT (ELECTROSURGICAL) ×1 IMPLANT
ELECTRODE REM PT RTRN 9FT ADLT (ELECTROSURGICAL) ×1 IMPLANT
GLOVE BIO SURGEON STRL SZ7.5 (GLOVE) ×2 IMPLANT
GOWN STRL REUS W/ TWL LRG LVL3 (GOWN DISPOSABLE) ×2 IMPLANT
KIT MARKER MARGIN INK (KITS) ×1 IMPLANT
NDL HYPO 25X1 1.5 SAFETY (NEEDLE) IMPLANT
NEEDLE HYPO 25X1 1.5 SAFETY (NEEDLE) ×1 IMPLANT
NS IRRIG 1000ML POUR BTL (IV SOLUTION) IMPLANT
PACK BASIN DAY SURGERY FS (CUSTOM PROCEDURE TRAY) ×1 IMPLANT
PENCIL SMOKE EVACUATOR (MISCELLANEOUS) ×1 IMPLANT
SLEEVE SCD COMPRESS KNEE MED (STOCKING) ×1 IMPLANT
SPIKE FLUID TRANSFER (MISCELLANEOUS) IMPLANT
SPONGE T-LAP 18X18 ~~LOC~~+RFID (SPONGE) ×1 IMPLANT
SUT MON AB 4-0 PC3 18 (SUTURE) ×1 IMPLANT
SUT SILK 2 0 SH (SUTURE) IMPLANT
SUT VICRYL 3-0 CR8 SH (SUTURE) ×1 IMPLANT
SYR CONTROL 10ML LL (SYRINGE) IMPLANT
TOWEL GREEN STERILE FF (TOWEL DISPOSABLE) ×1 IMPLANT
TRAY FAXITRON CT DISP (TRAY / TRAY PROCEDURE) ×1 IMPLANT
TUBE CONNECTING 20X1/4 (TUBING) ×1 IMPLANT
YANKAUER SUCT BULB TIP NO VENT (SUCTIONS) IMPLANT

## 2024-01-12 NOTE — Transfer of Care (Signed)
 Immediate Anesthesia Transfer of Care Note  Patient: Misty Bullock  Procedure(s) Performed: BREAST LUMPECTOMY WITH RADIOACTIVE SEED LOCALIZATION (Right: Breast)  Patient Location: PACU  Anesthesia Type:General  Level of Consciousness: drowsy and patient cooperative  Airway & Oxygen Therapy: Patient Spontanous Breathing and Patient connected to face mask oxygen  Post-op Assessment: Report given to RN and Post -op Vital signs reviewed and stable  Post vital signs: Reviewed and stable  Last Vitals:  Vitals Value Taken Time  BP 109/66 01/12/24 1011  Temp    Pulse 87 01/12/24 1012  Resp 16 01/12/24 1012  SpO2 100 % 01/12/24 1012  Vitals shown include unfiled device data.  Last Pain:  Vitals:   01/12/24 0806  TempSrc: Temporal  PainSc: 0-No pain      Patients Stated Pain Goal: 3 (01/12/24 0806)  Complications: No notable events documented.

## 2024-01-12 NOTE — Discharge Instructions (Signed)
  Post Anesthesia Home Care Instructions  Activity: Get plenty of rest for the remainder of the day. A responsible individual must stay with you for 24 hours following the procedure.  For the next 24 hours, DO NOT: -Drive a car -Advertising copywriter -Drink alcoholic beverages -Take any medication unless instructed by your physician -Make any legal decisions or sign important papers.  Meals: Start with liquid foods such as gelatin or soup. Progress to regular foods as tolerated. Avoid greasy, spicy, heavy foods. If nausea and/or vomiting occur, drink only clear liquids until the nausea and/or vomiting subsides. Call your physician if vomiting continues.  Special Instructions/Symptoms: Your throat may feel dry or sore from the anesthesia or the breathing tube placed in your throat during surgery. If this causes discomfort, gargle with warm salt water. The discomfort should disappear within 24 hours.  May have Tylenol after 2pm if needed.

## 2024-01-12 NOTE — H&P (Signed)
 REFERRING PHYSICIAN: McComb, Raphael Gibney, MD PROVIDER: Lindell Noe, MD MRN: [id number] DOB: Jan 11, 1956 Subjective   Chief Complaint: New Consultation (Rt breast Cancer )  History of Present Illness: Misty Bullock is a 68 y.o. female who is seen today as an office consultation for evaluation of New Consultation (Rt breast Cancer )  We are asked to see the patient in consultation by Dr. Richardean Chimera to evaluate her for a new right breast cancer. The patient is a 68 year old white female who recently went for a routine screening mammogram. At that time she was found to have a 1.9 cm area of calcification in the medial retroareolar right breast. The area was biopsied and came back as ductal carcinoma in situ that was ER and PR positive. She is otherwise in good health and does not smoke. She has no family history of breast cancer.  Review of Systems: A complete review of systems was obtained from the patient. I have reviewed this information and discussed as appropriate with the patient. See HPI as well for other ROS.  ROS   Medical History: History reviewed. No pertinent past medical history.  Patient Active Problem List  Diagnosis  Ductal carcinoma in situ (DCIS) of right breast   Past Surgical History:  Procedure Laterality Date  Left Wrist    Allergies  Allergen Reactions  Augmentin [Amoxicillin-Pot Clavulanate] Nausea   Current Outpatient Medications on File Prior to Visit  Medication Sig Dispense Refill  ALPRAZolam (XANAX) 0.5 MG tablet Take 1 tablet by mouth at bedtime as needed  amoxicillin-clavulanate (AUGMENTIN) 875-125 mg tablet Take 1 tablet by mouth 2 (two) times daily  azelastine (ASTELIN) 137 mcg nasal spray Place 2 sprays into one nostril 2 (two) times daily  FLUoxetine (PROZAC) 40 MG capsule Take 40 mg by mouth once daily  PROLIA 60 mg/mL inj syringe INJECT 60MG  SUBCUTANEOUSLY EVERY 6 MONTHS  valACYclovir (VALTREX) 500 MG tablet TAKE 1 TABLET BY MOUTH TWICE A DAY FOR  3 DAYS  zolpidem (AMBIEN) 10 mg tablet TAKE 1/2 TABLET BY MOUTH EVERY DAY AT BEDTIME   No current facility-administered medications on file prior to visit.   History reviewed. No pertinent family history.   Social History   Tobacco Use  Smoking Status Never  Smokeless Tobacco Never    Social History   Socioeconomic History  Marital status: Divorced  Tobacco Use  Smoking status: Never  Smokeless tobacco: Never  Vaping Use  Vaping status: Never Used  Substance and Sexual Activity  Alcohol use: Yes  Drug use: Never   Social Drivers of Corporate investment banker Strain: Low Risk (08/28/2023)  Received [facility]  Overall Financial Resource Strain (CARDIA)  Difficulty of Paying Living Expenses: Not hard at all  Food Insecurity: No Food Insecurity (08/28/2023)  Received from [facility]  Hunger Vital Sign  Worried About Running Out of Food in the Last Year: Never true  Ran Out of Food in the Last Year: Never true  Transportation Needs: No Transportation Needs (08/28/2023)  Received from [facility] - Transportation  Lack of Transportation (Medical): No  Lack of Transportation (Non-Medical): No  Physical Activity: Inactive (08/28/2023)  Received from [facility]  Exercise Vital Sign  Days of Exercise per Week: 0 days  Minutes of Exercise per Session: 0 min  Stress: No Stress Concern Present (08/28/2023)  Received from [facility] - Occupational Stress Questionnaire  Feeling of Stress : Only a little  Social Connections: Socially Isolated (08/28/2023)  Received from [facility]  Social Connection and Isolation Panel [NHANES]  Frequency of Communication with Friends and Family: More than three times a week  Frequency of Social Gatherings with Friends and Family: More than three times a week  Attends Religious Services: Never  Database administrator or Organizations: No  Attends Banker Meetings:  Never  Marital Status: Never married  Housing Stability: Unknown (12/29/2023)  Housing Stability Vital Sign  Homeless in the Last Year: No   Objective:   Vitals:  BP: 118/81  Pulse: 71  Temp: 36.7 C (98 F)  SpO2: 94%  Weight: 54.2 kg (119 lb 6.4 oz)  Height: 167.6 cm (5\' 6" )  PainSc: 0-No pain   Body mass index is 19.27 kg/m.  Physical Exam Vitals reviewed.  Constitutional:  General: She is not in acute distress. Appearance: Normal appearance.  HENT:  Head: Normocephalic and atraumatic.  Right Ear: External ear normal.  Left Ear: External ear normal.  Nose: Nose normal.  Mouth/Throat:  Mouth: Mucous membranes are moist.  Pharynx: Oropharynx is clear.  Eyes:  General: No scleral icterus. Extraocular Movements: Extraocular movements intact.  Conjunctiva/sclera: Conjunctivae normal.  Pupils: Pupils are equal, round, and reactive to light.  Cardiovascular:  Rate and Rhythm: Normal rate and regular rhythm.  Pulses: Normal pulses.  Heart sounds: Normal heart sounds.  Pulmonary:  Effort: Pulmonary effort is normal. No respiratory distress.  Breath sounds: Normal breath sounds.  Abdominal:  General: Bowel sounds are normal.  Palpations: Abdomen is soft.  Tenderness: There is no abdominal tenderness.  Musculoskeletal:  General: No swelling, tenderness or deformity. Normal range of motion.  Cervical back: Normal range of motion and neck supple.  Skin: General: Skin is warm and dry.  Coloration: Skin is not jaundiced.  Neurological:  General: No focal deficit present.  Mental Status: She is alert and oriented to person, place, and time.  Psychiatric:  Mood and Affect: Mood normal.  Behavior: Behavior normal.     Breast: There is no palpable mass in either breast. There is 1 small mobile palpable lymph node in the left axilla but no other palpable lymphadenopathy.  Labs, Imaging and Diagnostic Testing:  Assessment and Plan:   Diagnoses and all orders for  this visit:  Ductal carcinoma in situ (DCIS) of right breast - [facility] Case Posting Request; Future - Ambulatory Referral to Oncology-Medical - Ambulatory Referral to Radiation Oncology   The patient appears to have a 1.9 cm area of ductal carcinoma in situ in the medial retroareolar right breast. I have discussed with her in detail the different options for treatment and at this point she favors breast conservation which I feel is very reasonable. She would not need a node evaluation. I have discussed with her in detail the risks and benefits of the operation as well as some of the technical aspects including use of a radioactive seed for localization and the possibility that if her superficial margin is positive that she could end up needing a second operation where we sacrificed the nipple and areola and she understands and wishes to proceed. We will move forward with surgical scheduling. We will make her referrals to medical and radiation oncology to discuss adjuvant therapy.

## 2024-01-12 NOTE — Op Note (Signed)
 01/12/2024  10:05 AM  PATIENT:  Misty Bullock  68 y.o. female  PRE-OPERATIVE DIAGNOSIS:  RIGHT BREAST DCIS  POST-OPERATIVE DIAGNOSIS:  RIGHT BREAST DCIS  PROCEDURE:  Procedure(s): BREAST LUMPECTOMY WITH RADIOACTIVE SEED LOCALIZATION (Right)  SURGEON:  Surgeons and Role:    * Griselda Miner, MD - Primary  PHYSICIAN ASSISTANT:   ASSISTANTS: none   ANESTHESIA:   local and general  EBL:  minimal   BLOOD ADMINISTERED:none  DRAINS: none   LOCAL MEDICATIONS USED:  MARCAINE     SPECIMEN:  Source of Specimen:  right breast tissue  DISPOSITION OF SPECIMEN:  PATHOLOGY  COUNTS:  YES  TOURNIQUET:  * No tourniquets in log *  DICTATION: .Dragon Dictation  After informed consent was obtained the patient was brought to the operating room and placed in the supine position on the operating table.  After adequate induction of general anesthesia the patient's right breast was prepped with ChloraPrep, allowed to dry, and draped in usual sterile manner.  An appropriate timeout was performed.  Previously an I-125 seed was placed in the upper inner central right breast to mark an area of ductal carcinoma in situ.  The neoprobe was set to I-125 in the area of radioactivity was readily identified.  The area around this was infiltrated with quarter percent Marcaine.  A curvilinear incision was made along the upper inner edge of the areola of the right breast with a 15 blade knife.  The incision was carried through the skin and subcutaneous tissue sharply with the electrocautery.  Dissection was then carried into the upper inner quadrant of the right breast between the breast tissue and the subcutaneous fat and skin.  The dissection was also carried superficially behind the nipple and areola.  Next a circular portion of breast tissue was removed sharply with the electrocautery around the radioactive seed while checking the area of radioactivity frequently.  This dissection was carried all the way to the  muscle of the chest wall.  Once the tissue was removed it was then oriented with the appropriate paint colors.  A specimen radiograph was obtained that showed the clip and seed to be near the center of the specimen.  The specimen was then sent to pathology for further evaluation.  Hemostasis was achieved using the Bovie electrocautery.  The wound was irrigated with saline and infiltrated with more quarter percent Marcaine.  The deep layer of the incision was then closed with layers of interrupted 3-0 Vicryl stitches.  The skin was then closed with interrupted 4-0 Monocryl subcuticular stitches.  Dermabond dressings were applied.  The patient tolerated the procedure well.  At the end of the case all needle sponge and instrument counts were correct.  The patient was then awakened and taken to recovery in stable condition.  PLAN OF CARE: Discharge to home after PACU  PATIENT DISPOSITION:  PACU - hemodynamically stable.   Delay start of Pharmacological VTE agent (>24hrs) due to surgical blood loss or risk of bleeding: not applicable

## 2024-01-12 NOTE — Anesthesia Procedure Notes (Signed)
 Procedure Name: LMA Insertion Date/Time: 01/12/2024 9:20 AM  Performed by: Yolanda Bonine, CRNAPre-anesthesia Checklist: Patient identified, Emergency Drugs available, Suction available, Patient being monitored and Timeout performed Patient Re-evaluated:Patient Re-evaluated prior to induction Oxygen Delivery Method: Circle system utilized Preoxygenation: Pre-oxygenation with 100% oxygen Induction Type: IV induction Ventilation: Mask ventilation without difficulty LMA: LMA inserted LMA Size: 4.0 Number of attempts: 1 Placement Confirmation: positive ETCO2 Dental Injury: Teeth and Oropharynx as per pre-operative assessment

## 2024-01-12 NOTE — Anesthesia Postprocedure Evaluation (Signed)
 Anesthesia Post Note  Patient: Misty Bullock  Procedure(s) Performed: BREAST LUMPECTOMY WITH RADIOACTIVE SEED LOCALIZATION (Right: Breast)     Patient location during evaluation: PACU Anesthesia Type: General Level of consciousness: awake and alert, oriented and patient cooperative Pain management: pain level controlled Vital Signs Assessment: post-procedure vital signs reviewed and stable Respiratory status: spontaneous breathing, nonlabored ventilation and respiratory function stable Cardiovascular status: blood pressure returned to baseline and stable Postop Assessment: no apparent nausea or vomiting Anesthetic complications: no   No notable events documented.  Last Vitals:  Vitals:   01/12/24 1100 01/12/24 1119  BP: 99/66 118/80  Pulse: 76 76  Resp: 17 14  Temp:  (!) 36.2 C  SpO2: 95% 97%    Last Pain:  Vitals:   01/12/24 1119  TempSrc:   PainSc: 0-No pain                 Lannie Fields

## 2024-01-12 NOTE — Interval H&P Note (Signed)
 History and Physical Interval Note:  01/12/2024 8:09 AM  Misty Bullock  has presented today for surgery, with the diagnosis of RIGHT BREAST DCIS.  The various methods of treatment have been discussed with the patient and family. After consideration of risks, benefits and other options for treatment, the patient has consented to  Procedure(s): BREAST LUMPECTOMY WITH RADIOACTIVE SEED LOCALIZATION (Right) as a surgical intervention.  The patient's history has been reviewed, patient examined, no change in status, stable for surgery.  I have reviewed the patient's chart and labs.  Questions were answered to the patient's satisfaction.     Chevis Pretty III

## 2024-01-12 NOTE — Anesthesia Preprocedure Evaluation (Addendum)
 Anesthesia Evaluation  Patient identified by MRN, date of birth, ID band Patient awake    Reviewed: Allergy & Precautions, NPO status , Patient's Chart, lab work & pertinent test results  Airway Mallampati: I  TM Distance: >3 FB Neck ROM: Full    Dental  (+) Teeth Intact, Dental Advisory Given   Pulmonary neg pulmonary ROS   Pulmonary exam normal breath sounds clear to auscultation       Cardiovascular negative cardio ROS Normal cardiovascular exam Rhythm:Regular Rate:Normal     Neuro/Psych  PSYCHIATRIC DISORDERS Anxiety      Neuromuscular disease (RLS)    GI/Hepatic negative GI ROS, Neg liver ROS,,,  Endo/Other  negative endocrine ROS    Renal/GU negative Renal ROS  negative genitourinary   Musculoskeletal negative musculoskeletal ROS (+)    Abdominal   Peds  Hematology negative hematology ROS (+)   Anesthesia Other Findings   Reproductive/Obstetrics negative OB ROS                             Anesthesia Physical Anesthesia Plan  ASA: 2  Anesthesia Plan: General   Post-op Pain Management: Tylenol PO (pre-op)*   Induction: Intravenous  PONV Risk Score and Plan: 3 and Ondansetron, Dexamethasone, Midazolam and Treatment may vary due to age or medical condition  Airway Management Planned: LMA  Additional Equipment: None  Intra-op Plan:   Post-operative Plan: Extubation in OR  Informed Consent: I have reviewed the patients History and Physical, chart, labs and discussed the procedure including the risks, benefits and alternatives for the proposed anesthesia with the patient or authorized representative who has indicated his/her understanding and acceptance.     Dental advisory given  Plan Discussed with: CRNA  Anesthesia Plan Comments:        Anesthesia Quick Evaluation

## 2024-01-13 ENCOUNTER — Encounter (HOSPITAL_BASED_OUTPATIENT_CLINIC_OR_DEPARTMENT_OTHER): Payer: Self-pay | Admitting: General Surgery

## 2024-01-15 DIAGNOSIS — H353221 Exudative age-related macular degeneration, left eye, with active choroidal neovascularization: Secondary | ICD-10-CM | POA: Diagnosis not present

## 2024-01-16 LAB — SURGICAL PATHOLOGY

## 2024-01-22 ENCOUNTER — Encounter: Payer: Self-pay | Admitting: *Deleted

## 2024-01-22 DIAGNOSIS — C50011 Malignant neoplasm of nipple and areola, right female breast: Secondary | ICD-10-CM | POA: Insufficient documentation

## 2024-01-24 ENCOUNTER — Inpatient Hospital Stay

## 2024-01-24 ENCOUNTER — Inpatient Hospital Stay: Attending: Hematology and Oncology | Admitting: Hematology and Oncology

## 2024-01-24 VITALS — BP 114/96 | HR 100 | Temp 97.6°F | Resp 17 | Wt 116.9 lb

## 2024-01-24 DIAGNOSIS — Z170 Estrogen receptor positive status [ER+]: Secondary | ICD-10-CM | POA: Insufficient documentation

## 2024-01-24 DIAGNOSIS — C50111 Malignant neoplasm of central portion of right female breast: Secondary | ICD-10-CM | POA: Insufficient documentation

## 2024-01-24 DIAGNOSIS — C50011 Malignant neoplasm of nipple and areola, right female breast: Secondary | ICD-10-CM | POA: Diagnosis not present

## 2024-01-24 NOTE — Progress Notes (Signed)
 [facility] CONSULT NOTE  Patient Care Team: Ardith Dark, MD as PCP - General (Family Medicine) Maris Berger, MD as Consulting Physician (Ophthalmology) Richardean Chimera, MD as Consulting Physician (Obstetrics and Gynecology) [place], Amy, MD as Consulting Physician (Dermatology) Pershing Proud, RN as Oncology Nurse Navigator Donnelly Angelica, RN as Oncology Nurse Navigator Serena Croissant, MD as Consulting Physician (Hematology and Oncology)  CHIEF COMPLAINTS/PURPOSE OF CONSULTATION:  Newly diagnosed breast cancer  HISTORY OF PRESENTING ILLNESS:  History of Present Illness The patient, with a recent diagnosis of breast cancer, underwent surgery following the detection of a mass during a routine mammogram. The patient reports a smooth recovery from the surgery with no residual pain. The initial biopsy revealed ductal carcinoma in situ (DCIS), but the final surgical pathology showed a small amount of invasive ductal cancer. The patient's cancer was estrogen receptor and progesterone receptor positive, and HER2 negative, indicating a more favorable prognosis. The Ki-67 proliferation marker was low at 1%, and the cancer was graded as low grade or grade 1. The final stage of the cancer was stage 1A, the earliest stage.     I reviewed her records extensively and collaborated the history with the patient.  SUMMARY OF ONCOLOGIC HISTORY: Oncology History  Malignant neoplasm of areola of right breast in female, estrogen receptor positive (HCC)  12/20/2023 Initial Diagnosis   Screening mammogram detected indeterminate calcifications measuring 1.9 cm: Biopsy: High-grade DCIS ER 90%, PR 90%   01/12/2024 Surgery   Right lumpectomy: Grade 1 IDC 0.4 cm with high-grade DCIS, margins negative, LVI not identified, ER 80%, PR 95 %, Ki-67 1%, HER2 1+ negative      MEDICAL HISTORY:  Past Medical History:  Diagnosis Date   Anxiety    History of chicken pox    Insomnia    Restless  leg syndrome     SURGICAL HISTORY: Past Surgical History:  Procedure Laterality Date   bilateral blerphoplasty      BREAST BIOPSY Right 12/20/2023   MM RT BREAST BX W LOC DEV 1ST LESION IMAGE BX SPEC STEREO GUIDE 12/20/2023 [facility]   BREAST BIOPSY  01/11/2024   MM RT RADIOACTIVE SEED LOC MAMMO GUIDE 01/11/2024 [facility]   BREAST LUMPECTOMY WITH RADIOACTIVE SEED LOCALIZATION Right 01/12/2024   Procedure: BREAST LUMPECTOMY WITH RADIOACTIVE SEED LOCALIZATION;  Surgeon: Griselda Miner, MD;  Location: [facility];  Service: General;  Laterality: Right;   cataract surgery      OPEN REDUCTION INTERNAL FIXATION (ORIF) DISTAL RADIAL FRACTURE Left 03/03/2017   Procedure: OPEN REDUCTION INTERNAL FIXATION (ORIF) LEFT DISTAL RADIUS FRACTURE;  Surgeon: Kathryne Hitch, MD;  Location: [facility];  Service: Orthopedics;  Laterality: Left;   WISDOM TOOTH EXTRACTION      SOCIAL HISTORY: Social History   Socioeconomic History   Marital status: Single    Spouse name: Not on file   Number of children: Not on file   Years of education: Not on file   Highest education level: Not on file  Occupational History   Not on file  Tobacco Use   Smoking status: Never   Smokeless tobacco: Never  Vaping Use   Vaping status: Never Used  Substance and Sexual Activity   Alcohol use: Yes    Alcohol/week: 0.0 standard drinks of alcohol    Comment: wine   Drug use: No   Sexual activity: Not on file  Other Topics Concern   Not on file  Social History Narrative  Not on file   Social Drivers of Health   Financial Resource Strain: Low Risk  (08/28/2023)   Overall Financial Resource Strain (CARDIA)    Difficulty of Paying Living Expenses: Not hard at all  Food Insecurity: No Food Insecurity (08/28/2023)   Hunger Vital Sign    Worried About Running Out of Food in the Last Year: Never true    Ran Out of Food in the Last Year: Never true  Transportation Needs: No Transportation  Needs (08/28/2023)   PRAPARE - Administrator, Civil Service (Medical): No    Lack of Transportation (Non-Medical): No  Physical Activity: Inactive (08/28/2023)   Exercise Vital Sign    Days of Exercise per Week: 0 days    Minutes of Exercise per Session: 0 min  Stress: No Stress Concern Present (08/28/2023)   [employer] of Occupational Health - Occupational Stress Questionnaire    Feeling of Stress : Only a little  Social Connections: Socially Isolated (08/28/2023)   Social Connection and Isolation Panel [NHANES]    Frequency of Communication with Friends and Family: More than three times a week    Frequency of Social Gatherings with Friends and Family: More than three times a week    Attends Religious Services: Never    Database administrator or Organizations: No    Attends Banker Meetings: Never    Marital Status: Never married  Intimate Partner Violence: Not At Risk (08/28/2023)   Humiliation, Afraid, Rape, and Kick questionnaire    Fear of Current or Ex-Partner: No    Emotionally Abused: No    Physically Abused: No    Sexually Abused: No    FAMILY HISTORY: No family history on file.  ALLERGIES:  is allergic to latex and augmentin [amoxicillin-pot clavulanate].  MEDICATIONS:  Current Outpatient Medications  Medication Sig Dispense Refill   ALPRAZolam (XANAX) 0.5 MG tablet TAKE 1 TABLET BY MOUTH AT BEDTIME AS NEEDED FOR ANXIETY 30 tablet 5   denosumab (PROLIA) 60 MG/ML SOSY injection Prolia 60 mg/mL subcutaneous syringe  Dispense 1 prefilled syringe to MD office for SQ injection Q 6 months     FLUoxetine (PROZAC) 40 MG capsule TAKE 1 CAPSULE (40 MG TOTAL) BY MOUTH DAILY. 90 capsule 0   oxyCODONE (ROXICODONE) 5 MG immediate release tablet Take 1 tablet (5 mg total) by mouth every 6 (six) hours as needed for severe pain (pain score 7-10). 10 tablet 0   valACYclovir (VALTREX) 500 MG tablet valacyclovir 500 mg tablet     zolpidem (AMBIEN) 10 MG  tablet TAKE 1/2 TABLET BY MOUTH EVERY DAY AT BEDTIME 45 tablet 1   No current facility-administered medications for this visit.    REVIEW OF SYSTEMS:   Constitutional: Denies fevers, chills or abnormal night sweats Breast: Recent breast surgery All other systems were reviewed with the patient and are negative.  PHYSICAL EXAMINATION: ECOG PERFORMANCE STATUS: 1 - Symptomatic but completely ambulatory  Vitals:   01/24/24 1258  BP: (!) 114/96  Pulse: 100  Resp: 17  Temp: 97.6 F (36.4 C)  SpO2: 99%   Filed Weights   01/24/24 1258  Weight: 116 lb 14.4 oz (53 kg)    GENERAL:alert, no distress and comfortable  LABORATORY DATA:  I have reviewed the data as listed Lab Results  Component Value Date   WBC 4.5 08/31/2023   HGB 15.7 (H) 08/31/2023   HCT 47.1 (H) 08/31/2023   MCV 99.6 08/31/2023   PLT 244.0 08/31/2023  Lab Results  Component Value Date   NA 136 08/31/2023   K 4.5 08/31/2023   CL 97 08/31/2023   CO2 28 08/31/2023    RADIOGRAPHIC STUDIES: I have personally reviewed the radiological reports and agreed with the findings in the report.  ASSESSMENT AND PLAN:  Malignant neoplasm of areola of right breast in female, estrogen receptor positive (HCC) 12/20/2023:Screening mammogram detected indeterminate calcifications measuring 1.9 cm: Biopsy: High-grade DCIS ER 90%, PR 90%   01/12/2024:Right lumpectomy: Grade 1 IDC 0.4 cm with high-grade DCIS, margins negative, LVI not identified, ER 80%, PR 95 %, Ki-67 1%, HER2 1+ negative  Pathology counseling: I discussed the final pathology report of the patient provided  a copy of this report. I discussed the margins as well as lymph node surgeries. We also discussed the final staging along with previously performed ER/PR and HER-2/neu testing.  Treatment plan: Adjuvant radiation therapy Adjuvant antiestrogen therapy  She is currently retired and helping take care of her mother who is in a assisted living facility Return to  clinic after radiation is completed  Assessment and Plan Assessment & Plan Invasive Ductal Carcinoma of the Breast, Stage 1A Stage 1A invasive ductal carcinoma, 0.4 cm, ER/PR-positive, HER2-negative, low Ki-67, grade 1. Favorable prognosis, no chemotherapy needed. Radiation therapy recommended to reduce recurrence risk. Anastrozole advised post-radiation for recurrence prevention. - Recommend radiation therapy post-surgery. - Prescribe anastrozole 1 mg daily for five years post-radiation. - Discuss potential side effects of anastrozole, including hot flashes and joint stiffness. - Schedule follow-up on the last day of radiation therapy to discuss antiestrogen therapy.  Follow-up Appointments with radiation oncology scheduled. Follow-up with medical oncology planned post-radiation therapy. - Schedule follow-up with medical oncologist post-radiation therapy.     All questions were answered. The patient knows to call the clinic with any problems, questions or concerns.    Viinay K Mizuki Hoel, MD 01/24/24

## 2024-01-24 NOTE — Assessment & Plan Note (Signed)
 12/20/2023:Screening mammogram detected indeterminate calcifications measuring 1.9 cm: Biopsy: High-grade DCIS ER 90%, PR 90%   01/12/2024:Right lumpectomy: Grade 1 IDC 0.4 cm with high-grade DCIS, margins negative, LVI not identified, ER 80%, PR 95 %, Ki-67 1%, HER2 1+ negative  Pathology counseling: I discussed the final pathology report of the patient provided  a copy of this report. I discussed the margins as well as lymph node surgeries. We also discussed the final staging along with previously performed ER/PR and HER-2/neu testing.  Treatment plan: Adjuvant radiation therapy Adjuvant antiestrogen therapy  Return to clinic after radiation is completed

## 2024-01-25 ENCOUNTER — Encounter: Payer: Self-pay | Admitting: *Deleted

## 2024-01-30 ENCOUNTER — Inpatient Hospital Stay: Admitting: Licensed Clinical Social Worker

## 2024-01-30 NOTE — Progress Notes (Signed)
 [facility] Clinical Social Work  Initial Assessment   Misty Bullock is a 68 y.o. year old female contacted by phone. Clinical Social Work was referred by new patient protocol for assessment of psychosocial needs.   SDOH (Social Determinants of Health) assessments performed: Yes SDOH Interventions    Flowsheet Row Clinical Support from 01/30/2024 in [facility] Clinical Support from 08/28/2023 in [facility] Clinical Support from 08/19/2022 in [facility]  SDOH Interventions     Food Insecurity Interventions Intervention Not Indicated Intervention Not Indicated Intervention Not Indicated  Housing Interventions Intervention Not Indicated Intervention Not Indicated Intervention Not Indicated  Transportation Interventions Intervention Not Indicated Intervention Not Indicated Intervention Not Indicated  Utilities Interventions Intervention Not Indicated Intervention Not Indicated --  Financial Strain Interventions -- Intervention Not Indicated Intervention Not Indicated  Physical Activity Interventions -- Intervention Not Indicated Intervention Not Indicated  Stress Interventions -- Intervention Not Indicated Intervention Not Indicated  Social Connections Interventions -- Intervention Not Indicated Intervention Not Indicated  Health Literacy Interventions -- Intervention Not Indicated --       SDOH Screenings   Food Insecurity: No Food Insecurity (01/30/2024)  Housing: Low Risk  (01/30/2024)  Transportation Needs: No Transportation Needs (01/30/2024)  Utilities: Not At Risk (01/30/2024)  Depression (PHQ2-9): Low Risk  (01/01/2024)  Financial Resource Strain: Low Risk  (08/28/2023)  Physical Activity: Inactive (08/28/2023)  Social Connections: Socially Isolated (08/28/2023)  Stress: No Stress Concern Present (08/28/2023)  Tobacco Use: Low Risk  (01/12/2024)  Health  Literacy: Adequate Health Literacy (08/28/2023)     Distress Screen completed: No     No data to display            Family/Social Information:  Housing Arrangement: patient lives alone. She helps care for her mom who is in an assisted living facility and has dementia Family members/support persons in your life? Family and Friends Transportation concerns: no  Employment: Retired.  Income source: Actor concerns: No Type of concern: None Food access concerns: no Religious or spiritual practice: Not known Advanced directives: Yes-not on file Services Currently in place:  [employer]  Coping/ Adjustment to diagnosis: Patient understands treatment plan and what happens next? yes, doing well recovering from surgery and now getting ready for radiation. Feels she is handling diagnosis and treatment well so far. She is grateful she will be able to continue caring for her mom throughout this process Concerns about diagnosis and/or treatment:  mom's health and caring for her Patient reported stressors: Adjusting to my illness and family concerns Hopes and/or priorities: be able to continue caring for her mom Current coping skills/ strengths: Ability for insight , Capable of independent living , Communication skills , and Motivation for treatment/growth     SUMMARY: Current SDOH Barriers:  No major barriers identified today  Clinical Social Work Clinical Goal(s):  No clinical social work goals at this time  Interventions: Discussed common feeling and emotions when being diagnosed with cancer, and the importance of support during treatment Informed patient of the support team roles and support services at [facility]- mailed May calendar to patient Provided CSW contact information and encouraged patient to call with any questions or concerns   Follow Up Plan: Patient will contact CSW with any support or resource needs Patient verbalizes understanding of  plan: Yes    Haig Gerardo E Keisa Blow, LCSW  Clinical Social Worker [facility]

## 2024-02-05 NOTE — Progress Notes (Incomplete)
 Radiation Oncology         [phone]) [phone] ________________________________  Name: Misty Bullock        MRN: [id number]  Date of Service: 02/07/2024 DOB: 17-Mar-1956  PJ:KDTOIZ, Jinny Mounts, MD  Cameron Cea, MD     REFERRING PHYSICIAN: Cameron Cea, MD   DIAGNOSIS: The primary encounter diagnosis was Malignant neoplasm of areola of right breast in female, estrogen receptor positive (HCC). A diagnosis of Ductal carcinoma in situ (DCIS) of right breast was also pertinent to this visit.   HISTORY OF PRESENT ILLNESS: Misty Bullock is a 68 y.o. female seen for follow-up of her diagnosis of right breast cancer. The patient was noted to have screening detected calcifications. She underwent diagnostic mammogram on 12/08/23 that confirmed 1.9 cm calcifications in the medial retroareolar right breast. She underwent a stereotactic biopsy on 12/20/23 that showed high grade DCIS that was ER/PR positive.   Since her last visit, the patient underwent right lumpectomy on 01/12/24. Final pathology confirmed a 2.5 cm area of high-grade DCIS as well as 4 mm of invasive ductal carcinoma that was grade 1.  Her invasive tumor was ER/PR positive HER2 negative with a Ki-67 of 1%.  Her margins were negative for invasive carcinoma and DCIS.  No additional molecular testing was recommended given the small size of the invasive component, and Dr. Lee Public anticipates the role for antiestrogen therapy following radiation. She's seen to consider adjuvant radiotherapy to the breast.    PREVIOUS RADIATION THERAPY: No   PAST MEDICAL HISTORY:  Past Medical History:  Diagnosis Date   Anxiety    History of chicken pox    Insomnia    Restless leg syndrome        PAST SURGICAL HISTORY: Past Surgical History:  Procedure Laterality Date   bilateral blerphoplasty      BREAST BIOPSY Right 12/20/2023   MM RT BREAST BX W LOC DEV 1ST LESION IMAGE BX SPEC STEREO GUIDE 12/20/2023 [facility]   BREAST BIOPSY  01/11/2024   MM RT  RADIOACTIVE SEED LOC MAMMO GUIDE 01/11/2024 [facility]   BREAST LUMPECTOMY WITH RADIOACTIVE SEED LOCALIZATION Right 01/12/2024   Procedure: BREAST LUMPECTOMY WITH RADIOACTIVE SEED LOCALIZATION;  Surgeon: Caralyn Chandler, MD;  Location: [facility];  Service: General;  Laterality: Right;   cataract surgery      OPEN REDUCTION INTERNAL FIXATION (ORIF) DISTAL RADIAL FRACTURE Left 03/03/2017   Procedure: OPEN REDUCTION INTERNAL FIXATION (ORIF) LEFT DISTAL RADIUS FRACTURE;  Surgeon: Arnie Lao, MD;  Location: [facility];  Service: Orthopedics;  Laterality: Left;   WISDOM TOOTH EXTRACTION       FAMILY HISTORY: No family history on file.   SOCIAL HISTORY:  reports that she has never smoked. She has never used smokeless tobacco. She reports current alcohol use. She reports that she does not use drugs. The patient is single and lives in [place]. She cares for her 49 year old mother who has recently fractured her femur and in a skilled facility. 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    ALLERGIES: Latex and Augmentin  [amoxicillin -pot clavulanate]   MEDICATIONS:  Current Outpatient Medications  Medication Sig Dispense Refill   ALPRAZolam  (XANAX ) 0.5 MG tablet TAKE 1 TABLET BY MOUTH AT BEDTIME AS NEEDED FOR ANXIETY 30 tablet 5   denosumab (PROLIA) 60 MG/ML SOSY injection Prolia 60 mg/mL subcutaneous syringe  Dispense 1 prefilled syringe to MD office for SQ injection Q 6 months  FLUoxetine  (PROZAC ) 40 MG capsule TAKE 1 CAPSULE (40 MG TOTAL) BY MOUTH DAILY. 90 capsule 0   oxyCODONE  (ROXICODONE ) 5 MG immediate release tablet Take 1 tablet (5 mg total) by mouth every 6 (six) hours as needed for severe pain (pain score 7-10). 10 tablet 0   valACYclovir (VALTREX) 500 MG tablet valacyclovir 500 mg tablet     zolpidem  (AMBIEN ) 10 MG tablet TAKE 1/2 TABLET BY MOUTH EVERY DAY AT BEDTIME 45 tablet 1   No current facility-administered medications for this  visit.     REVIEW OF SYSTEMS: On review of systems, the patient reports that she is doing***     PHYSICAL EXAM:  Wt Readings from Last 3 Encounters:  01/24/24 116 lb 14.4 oz (53 kg)  01/12/24 117 lb 4.6 oz (53.2 kg)  01/04/24 117 lb 6 oz (53.2 kg)   Temp Readings from Last 3 Encounters:  01/24/24 97.6 F (36.4 C) (Temporal)  01/12/24 (!) 97.1 F (36.2 C)  01/04/24 (!) 97.3 F (36.3 C) (Temporal)   BP Readings from Last 3 Encounters:  01/24/24 (!) 114/96  01/12/24 118/80  01/04/24 (!) 124/94   Pulse Readings from Last 3 Encounters:  01/24/24 100  01/12/24 76  01/04/24 (!) 101    In general this is a well appearing caucasian female in no acute distress. She's alert and oriented x4 and appropriate throughout the examination. Cardiopulmonary assessment is negative for acute distress and she exhibits normal effort.  The right breast reveals a well-healed surgical incision site without erythema, separation or drainage.    ECOG = ***  0 - Asymptomatic (Fully active, able to carry on all predisease activities without restriction)  1 - Symptomatic but completely ambulatory (Restricted in physically strenuous activity but ambulatory and able to carry out work of a light or sedentary nature. For example, light housework, office work)  2 - Symptomatic, <50% in bed during the day (Ambulatory and capable of all self care but unable to carry out any work activities. Up and about more than 50% of waking hours)  3 - Symptomatic, >50% in bed, but not bedbound (Capable of only limited self-care, confined to bed or chair 50% or more of waking hours)  4 - Bedbound (Completely disabled. Cannot carry on any self-care. Totally confined to bed or chair)  5 - Death   Aurea Blossom MM, Creech RH, Tormey DC, et al. [phone]). "Toxicity and response criteria of the [facility]". Am. Hillard Lowes. Oncol. 5 (6): 649-55    LABORATORY DATA:  Lab Results  Component Value Date   WBC 4.5  08/31/2023   HGB 15.7 (H) 08/31/2023   HCT 47.1 (H) 08/31/2023   MCV 99.6 08/31/2023   PLT 244.0 08/31/2023   Lab Results  Component Value Date   NA 136 08/31/2023   K 4.5 08/31/2023   CL 97 08/31/2023   CO2 28 08/31/2023   Lab Results  Component Value Date   ALT 18 08/31/2023   AST 37 08/31/2023   ALKPHOS 56 08/31/2023   BILITOT 0.5 08/31/2023      RADIOGRAPHY: MM Breast Surgical Specimen Result Date: 01/12/2024 CLINICAL DATA:  Specimen radiograph status post right breast lumpectomy. EXAM: SPECIMEN RADIOGRAPH OF THE RIGHT BREAST COMPARISON:  Previous exam(s). FINDINGS: Status post excision of the right breast. The radioactive seed and biopsy marker clip are present, completely intact, and were marked for pathology. These findings were communicated to the OR at 9:52 a.m. IMPRESSION: Specimen radiograph of the right breast. Electronically  Signed   By: Alinda Apley M.D.   On: 01/12/2024 09:52   MM RT RADIOACTIVE SEED LOC MAMMO GUIDE Result Date: 01/11/2024 CLINICAL DATA:  68 year old female with newly diagnosed right breast DCIS presenting for seed localization. EXAM: MAMMOGRAPHIC GUIDED RADIOACTIVE SEED LOCALIZATION OF THE RIGHT BREAST COMPARISON:  Previous exam(s). FINDINGS: Patient presents for radioactive seed localization prior to right breast lumpectomy. I met with the patient and we discussed the procedure of seed localization including benefits and alternatives. We discussed the high likelihood of a successful procedure. We discussed the risks of the procedure including infection, bleeding, tissue injury and further surgery. We discussed the low dose of radioactivity involved in the procedure. Informed, written consent was given. The usual time-out protocol was performed immediately prior to the procedure. Using mammographic guidance, sterile technique, 1% lidocaine  and an I-125 radioactive seed, the coil biopsy marking clip in the right breast was localized using a medial  approach. The follow-up mammogram images confirm the seed in the expected location and were marked for Dr. Alethea Andes. Follow-up survey of the patient confirms presence of the radioactive seed. Order number of I-125 seed:  [id number]. Total activity: 0.243 mCi reference Date: 12/06/2023 The patient tolerated the procedure well and was [facility]. She was given instructions regarding seed removal. IMPRESSION: Radioactive seed localization right breast. No apparent complications. Electronically Signed   By: Allena Ito M.D.   On: 01/11/2024 14:18       IMPRESSION/PLAN: 1. Stage IA, cT1a,cN0M0, ER/PR positive invasive ductal carcinoma with associated high-grade DCIS of the right breast. Dr. Jeryl Moris has reviewed the final pathology findings and today we reviewed the updates in her diagnosis as she had a very small invasive component.  Dr. Lee Public does not recommend chemotherapy but will plan to offer antiestrogen therapy.  Dr. Jeryl Moris recommends external radiotherapy to the breast  to reduce risks of local recurrence. We discussed the risks, benefits, short, and long term effects of radiotherapy, as well as the curative intent, and the patient is interested in proceeding.  I reviewed the delivery and logistics of radiotherapy and Dr. Jeryl Moris recommends 4 weeks of radiotherapy to the right breast. Written consent is obtained and placed in the chart, a copy was provided to the patient.  She will simulate on Friday.   In a visit lasting *** minutes, greater than 50% of the time was spent face to face reviewing her case, as well as in preparation of, discussing, and coordinating the patient's care.     Shelvia Dick, [facility]    **Disclaimer: This note was dictated with voice recognition software. Similar sounding words can inadvertently be transcribed and this note may contain transcription errors which may not have been corrected upon publication of note.**

## 2024-02-06 ENCOUNTER — Encounter: Payer: Self-pay | Admitting: *Deleted

## 2024-02-06 ENCOUNTER — Ambulatory Visit: Payer: Self-pay | Admitting: General Surgery

## 2024-02-06 NOTE — Progress Notes (Signed)
Apt canceled

## 2024-02-07 ENCOUNTER — Ambulatory Visit
Admission: RE | Admit: 2024-02-07 | Discharge: 2024-02-07 | Disposition: A | Discharge status: Home / Self Care | Source: Ambulatory Visit | Attending: Radiation Oncology | Admitting: Radiation Oncology

## 2024-02-07 ENCOUNTER — Ambulatory Visit

## 2024-02-07 DIAGNOSIS — C50011 Malignant neoplasm of nipple and areola, right female breast: Secondary | ICD-10-CM

## 2024-02-07 DIAGNOSIS — D0511 Intraductal carcinoma in situ of right breast: Secondary | ICD-10-CM

## 2024-02-08 ENCOUNTER — Other Ambulatory Visit: Payer: Self-pay

## 2024-02-08 ENCOUNTER — Encounter (HOSPITAL_BASED_OUTPATIENT_CLINIC_OR_DEPARTMENT_OTHER): Payer: Self-pay | Admitting: General Surgery

## 2024-02-08 DIAGNOSIS — C801 Malignant (primary) neoplasm, unspecified: Secondary | ICD-10-CM

## 2024-02-08 HISTORY — DX: Malignant (primary) neoplasm, unspecified: C80.1

## 2024-02-09 ENCOUNTER — Ambulatory Visit: Admitting: Radiation Oncology

## 2024-02-12 MED ORDER — CHLORHEXIDINE GLUCONATE CLOTH 2 % EX PADS: 6.0000 | MEDICATED_PAD | Freq: Once | CUTANEOUS | Status: DC

## 2024-02-12 NOTE — Progress Notes (Signed)

## 2024-02-13 NOTE — Anesthesia Preprocedure Evaluation (Signed)
 Anesthesia Evaluation  Patient identified by MRN, date of birth, ID band Patient awake    Reviewed: Allergy & Precautions, NPO status , Patient's Chart, lab work & pertinent test results  History of Anesthesia Complications Negative for: history of anesthetic complications  Airway Mallampati: II  TM Distance: >3 FB Neck ROM: Full    Dental  (+) Dental Advisory Given   Pulmonary neg pulmonary ROS   Pulmonary exam normal breath sounds clear to auscultation       Cardiovascular (-) hypertension(-) angina (-) Past MI, (-) Cardiac Stents and (-) CABG (-) dysrhythmias  Rhythm:Regular Rate:Normal  HLD   Neuro/Psych  PSYCHIATRIC DISORDERS Anxiety     negative neurological ROS     GI/Hepatic negative GI ROS, Neg liver ROS,,,  Endo/Other  negative endocrine ROS    Renal/GU negative Renal ROS     Musculoskeletal Osteoporosis    Abdominal   Peds  Hematology negative hematology ROS (+) Lab Results      Component                Value               Date                      WBC                      4.5                 08/31/2023                HGB                      15.7 (H)            08/31/2023                HCT                      47.1 (H)            08/31/2023                MCV                      99.6                08/31/2023                PLT                      244.0               08/31/2023              Anesthesia Other Findings   Reproductive/Obstetrics Right breast                             Anesthesia Physical Anesthesia Plan  ASA: 2  Anesthesia Plan: General   Post-op Pain Management: Tylenol  PO (pre-op)*   Induction: Intravenous  PONV Risk Score and Plan: 3 and Ondansetron , Dexamethasone , Propofol  infusion, TIVA and Treatment may vary due to age or medical condition  Airway Management Planned: LMA  Additional Equipment:   Intra-op Plan:   Post-operative Plan:  Extubation in OR  Informed Consent: I have reviewed the patients History and Physical, chart, labs  and discussed the procedure including the risks, benefits and alternatives for the proposed anesthesia with the patient or authorized representative who has indicated his/her understanding and acceptance.     Dental advisory given  Plan Discussed with: CRNA and Anesthesiologist  Anesthesia Plan Comments: (Risks of general anesthesia discussed including, but not limited to, sore throat, hoarse voice, chipped/damaged teeth, injury to vocal cords, nausea and vomiting, allergic reactions, lung infection, heart attack, stroke, and death. All questions answered. )        Anesthesia Quick Evaluation

## 2024-02-14 ENCOUNTER — Encounter: Payer: Self-pay | Admitting: *Deleted

## 2024-02-15 ENCOUNTER — Ambulatory Visit (HOSPITAL_BASED_OUTPATIENT_CLINIC_OR_DEPARTMENT_OTHER)
Admission: RE | Admit: 2024-02-15 | Discharge: 2024-02-15 | Disposition: A | Discharge status: Home / Self Care | Attending: General Surgery | Admitting: General Surgery

## 2024-02-15 ENCOUNTER — Encounter (HOSPITAL_BASED_OUTPATIENT_CLINIC_OR_DEPARTMENT_OTHER)
Admission: RE | Disposition: A | Discharge status: Home / Self Care | Payer: Self-pay | Source: Home / Self Care | Attending: General Surgery

## 2024-02-15 ENCOUNTER — Ambulatory Visit (HOSPITAL_BASED_OUTPATIENT_CLINIC_OR_DEPARTMENT_OTHER): Payer: Self-pay | Admitting: Anesthesiology

## 2024-02-15 ENCOUNTER — Other Ambulatory Visit: Payer: Self-pay

## 2024-02-15 ENCOUNTER — Encounter (HOSPITAL_BASED_OUTPATIENT_CLINIC_OR_DEPARTMENT_OTHER): Payer: Self-pay | Admitting: General Surgery

## 2024-02-15 DIAGNOSIS — C50111 Malignant neoplasm of central portion of right female breast: Secondary | ICD-10-CM | POA: Diagnosis not present

## 2024-02-15 DIAGNOSIS — Z1721 Progesterone receptor positive status: Secondary | ICD-10-CM | POA: Diagnosis not present

## 2024-02-15 DIAGNOSIS — Z170 Estrogen receptor positive status [ER+]: Secondary | ICD-10-CM | POA: Diagnosis not present

## 2024-02-15 DIAGNOSIS — C50911 Malignant neoplasm of unspecified site of right female breast: Secondary | ICD-10-CM | POA: Diagnosis not present

## 2024-02-15 DIAGNOSIS — N6001 Solitary cyst of right breast: Secondary | ICD-10-CM | POA: Diagnosis not present

## 2024-02-15 DIAGNOSIS — N641 Fat necrosis of breast: Secondary | ICD-10-CM | POA: Diagnosis not present

## 2024-02-15 DIAGNOSIS — Z1732 Human epidermal growth factor receptor 2 negative status: Secondary | ICD-10-CM | POA: Insufficient documentation

## 2024-02-15 DIAGNOSIS — M810 Age-related osteoporosis without current pathological fracture: Secondary | ICD-10-CM | POA: Diagnosis not present

## 2024-02-15 DIAGNOSIS — N6011 Diffuse cystic mastopathy of right breast: Secondary | ICD-10-CM | POA: Diagnosis not present

## 2024-02-15 DIAGNOSIS — D0511 Intraductal carcinoma in situ of right breast: Secondary | ICD-10-CM | POA: Diagnosis not present

## 2024-02-15 DIAGNOSIS — N61 Mastitis without abscess: Secondary | ICD-10-CM | POA: Diagnosis not present

## 2024-02-15 HISTORY — PX: BREAST LUMPECTOMY: SHX2

## 2024-02-15 SURGERY — BREAST LUMPECTOMY
Anesthesia: General | Site: Breast | Laterality: Right

## 2024-02-15 MED ORDER — ONDANSETRON HCL 4 MG/2ML IJ SOLN
INTRAMUSCULAR | Status: AC
  Filled 2024-02-15: qty 2

## 2024-02-15 MED ORDER — GABAPENTIN 100 MG PO CAPS
100.0000 mg | ORAL_CAPSULE | ORAL | Status: AC
  Administered 2024-02-15: 100 mg via ORAL

## 2024-02-15 MED ORDER — DEXAMETHASONE SODIUM PHOSPHATE 4 MG/ML IJ SOLN
INTRAMUSCULAR | Status: DC | PRN
  Administered 2024-02-15: 5 mg via INTRAVENOUS

## 2024-02-15 MED ORDER — PHENYLEPHRINE 80 MCG/ML (10ML) SYRINGE FOR IV PUSH (FOR BLOOD PRESSURE SUPPORT)
PREFILLED_SYRINGE | INTRAVENOUS | Status: AC
  Filled 2024-02-15: qty 10

## 2024-02-15 MED ORDER — BUPIVACAINE-EPINEPHRINE (PF) 0.25% -1:200000 IJ SOLN
INTRAMUSCULAR | Status: AC
  Filled 2024-02-15: qty 30

## 2024-02-15 MED ORDER — VANCOMYCIN HCL IN DEXTROSE 1-5 GM/200ML-% IV SOLN
INTRAVENOUS | Status: AC
  Filled 2024-02-15: qty 200

## 2024-02-15 MED ORDER — FENTANYL CITRATE (PF) 100 MCG/2ML IJ SOLN
INTRAMUSCULAR | Status: DC | PRN
  Administered 2024-02-15 (×2): 50 ug via INTRAVENOUS

## 2024-02-15 MED ORDER — AMISULPRIDE (ANTIEMETIC) 5 MG/2ML IV SOLN: 10.0000 mg | Freq: Once | INTRAVENOUS | Status: DC | PRN

## 2024-02-15 MED ORDER — SUCCINYLCHOLINE CHLORIDE 200 MG/10ML IV SOSY
PREFILLED_SYRINGE | INTRAVENOUS | Status: AC
  Filled 2024-02-15: qty 10

## 2024-02-15 MED ORDER — ATROPINE SULFATE 0.4 MG/ML IV SOLN
INTRAVENOUS | Status: AC
  Filled 2024-02-15: qty 1

## 2024-02-15 MED ORDER — LIDOCAINE HCL (CARDIAC) PF 100 MG/5ML IV SOSY
PREFILLED_SYRINGE | INTRAVENOUS | Status: DC | PRN
  Administered 2024-02-15: 150 mg via INTRAVENOUS
  Administered 2024-02-15 (×2): 50 mg via INTRAVENOUS

## 2024-02-15 MED ORDER — OXYCODONE HCL 5 MG PO TABS: 5.0000 mg | ORAL_TABLET | Freq: Four times a day (QID) | ORAL | 0 refills | Status: DC | PRN

## 2024-02-15 MED ORDER — PROPOFOL 10 MG/ML IV BOLUS
INTRAVENOUS | Status: DC | PRN
Start: 2024-02-15 — End: 2024-02-15
  Administered 2024-02-15: 250 mg via INTRAVENOUS
  Administered 2024-02-15: 50 mg via INTRAVENOUS

## 2024-02-15 MED ORDER — LACTATED RINGERS IV SOLN: INTRAVENOUS | Status: DC

## 2024-02-15 MED ORDER — VANCOMYCIN HCL IN DEXTROSE 1-5 GM/200ML-% IV SOLN
1000.0000 mg | INTRAVENOUS | Status: AC
  Administered 2024-02-15 (×2): 1000 mg via INTRAVENOUS

## 2024-02-15 MED ORDER — GABAPENTIN 100 MG PO CAPS
ORAL_CAPSULE | ORAL | Status: AC
  Filled 2024-02-15: qty 1

## 2024-02-15 MED ORDER — ACETAMINOPHEN 500 MG PO TABS
1000.0000 mg | ORAL_TABLET | ORAL | Status: AC
  Administered 2024-02-15: 1000 mg via ORAL

## 2024-02-15 MED ORDER — ONDANSETRON HCL 4 MG/2ML IJ SOLN
INTRAMUSCULAR | Status: DC | PRN
  Administered 2024-02-15: 4 mg via INTRAVENOUS

## 2024-02-15 MED ORDER — DEXAMETHASONE SODIUM PHOSPHATE 10 MG/ML IJ SOLN
INTRAMUSCULAR | Status: AC
  Filled 2024-02-15: qty 1

## 2024-02-15 MED ORDER — ACETAMINOPHEN 500 MG PO TABS
ORAL_TABLET | ORAL | Status: AC
  Filled 2024-02-15: qty 2

## 2024-02-15 MED ORDER — EPHEDRINE 5 MG/ML INJ
INTRAVENOUS | Status: AC
  Filled 2024-02-15: qty 5

## 2024-02-15 MED ORDER — OXYCODONE HCL 5 MG/5ML PO SOLN: 5.0000 mg | Freq: Once | ORAL | Status: AC | PRN

## 2024-02-15 MED ORDER — LIDOCAINE 2% (20 MG/ML) 5 ML SYRINGE
INTRAMUSCULAR | Status: AC
  Filled 2024-02-15: qty 5

## 2024-02-15 MED ORDER — OXYCODONE HCL 5 MG PO TABS
5.0000 mg | ORAL_TABLET | Freq: Once | ORAL | Status: AC | PRN
  Administered 2024-02-15: 5 mg via ORAL
  Filled 2024-02-15: qty 1

## 2024-02-15 MED ORDER — LIDOCAINE 2% (20 MG/ML) 5 ML SYRINGE
INTRAMUSCULAR | Status: DC | PRN
  Administered 2024-02-15: 60 mg via INTRAVENOUS

## 2024-02-15 MED ORDER — MIDAZOLAM HCL 5 MG/5ML IJ SOLN
INTRAMUSCULAR | Status: DC | PRN
  Administered 2024-02-15: 2 mg via INTRAVENOUS

## 2024-02-15 MED ORDER — MIDAZOLAM HCL 2 MG/2ML IJ SOLN
INTRAMUSCULAR | Status: AC
  Filled 2024-02-15: qty 2

## 2024-02-15 MED ORDER — BUPIVACAINE-EPINEPHRINE 0.25% -1:200000 IJ SOLN
INTRAMUSCULAR | Status: DC | PRN
  Administered 2024-02-15: 20 mL

## 2024-02-15 MED ORDER — FENTANYL CITRATE (PF) 100 MCG/2ML IJ SOLN: 25.0000 ug | INTRAMUSCULAR | Status: DC | PRN

## 2024-02-15 MED ORDER — FENTANYL CITRATE (PF) 100 MCG/2ML IJ SOLN
INTRAMUSCULAR | Status: AC
  Filled 2024-02-15: qty 2

## 2024-02-15 SURGICAL SUPPLY — 31 items
BLADE SURG 15 STRL LF DISP TIS (BLADE) ×1 IMPLANT
CANISTER SUCT 1200ML W/VALVE (MISCELLANEOUS) ×1 IMPLANT
CHLORAPREP W/TINT 26 (MISCELLANEOUS) ×1 IMPLANT
CLIP APPLIE 9.375 MED OPEN (MISCELLANEOUS) IMPLANT
COVER BACK TABLE 60X90IN (DRAPES) ×1 IMPLANT
COVER MAYO STAND STRL (DRAPES) ×1 IMPLANT
DERMABOND ADVANCED .7 DNX12 (GAUZE/BANDAGES/DRESSINGS) ×1 IMPLANT
DRAPE LAPAROSCOPIC ABDOMINAL (DRAPES) ×1 IMPLANT
DRAPE UTILITY XL STRL (DRAPES) ×1 IMPLANT
ELECT COATED BLADE 2.86 ST (ELECTRODE) ×1 IMPLANT
ELECTRODE BLDE 4.0 EZ CLN MEGD (MISCELLANEOUS) IMPLANT
ELECTRODE REM PT RTRN 9FT ADLT (ELECTROSURGICAL) ×1 IMPLANT
GLOVE BIO SURGEON STRL SZ7.5 (GLOVE) ×1 IMPLANT
GOWN STRL REUS W/ TWL LRG LVL3 (GOWN DISPOSABLE) ×2 IMPLANT
KIT MARKER MARGIN INK (KITS) IMPLANT
NDL HYPO 25X1 1.5 SAFETY (NEEDLE) ×1 IMPLANT
NEEDLE HYPO 25X1 1.5 SAFETY (NEEDLE) ×1 IMPLANT
NS IRRIG 1000ML POUR BTL (IV SOLUTION) ×1 IMPLANT
PACK BASIN DAY SURGERY FS (CUSTOM PROCEDURE TRAY) ×1 IMPLANT
PENCIL SMOKE EVACUATOR (MISCELLANEOUS) ×1 IMPLANT
SLEEVE SCD COMPRESS KNEE MED (STOCKING) ×1 IMPLANT
SPIKE FLUID TRANSFER (MISCELLANEOUS) ×1 IMPLANT
SPONGE T-LAP 18X18 ~~LOC~~+RFID (SPONGE) ×1 IMPLANT
SUT MON AB 4-0 PC3 18 (SUTURE) ×1 IMPLANT
SUT SILK 2 0 SH (SUTURE) ×1 IMPLANT
SUT VICRYL 3-0 CR8 SH (SUTURE) ×1 IMPLANT
SYR CONTROL 10ML LL (SYRINGE) ×1 IMPLANT
TOWEL GREEN STERILE FF (TOWEL DISPOSABLE) ×1 IMPLANT
TRAY FAXITRON CT DISP (TRAY / TRAY PROCEDURE) IMPLANT
TUBE CONNECTING 20X1/4 (TUBING) ×1 IMPLANT
YANKAUER SUCT BULB TIP NO VENT (SUCTIONS) ×1 IMPLANT

## 2024-02-15 NOTE — Anesthesia Postprocedure Evaluation (Signed)
 Anesthesia Post Note  Patient: Misty Bullock  Procedure(s) Performed: BREAST LUMPECTOMY (Right: Breast)     Patient location during evaluation: PACU Anesthesia Type: General Level of consciousness: awake Pain management: pain level controlled Vital Signs Assessment: post-procedure vital signs reviewed and stable Respiratory status: spontaneous breathing, nonlabored ventilation and respiratory function stable Cardiovascular status: blood pressure returned to baseline and stable Postop Assessment: no apparent nausea or vomiting Anesthetic complications: no   No notable events documented.  Last Vitals:  Vitals:   02/15/24 1130 02/15/24 1155  BP: 121/75 (!) 151/81  Pulse: 77 78  Resp: (!) 21 16  Temp:  36.6 C  SpO2: 96% 96%    Last Pain:  Vitals:   02/15/24 1155  TempSrc: Temporal  PainSc: 0-No pain                 Conard Decent

## 2024-02-15 NOTE — Discharge Instructions (Signed)
  Post Anesthesia Home Care Instructions  Activity: Get plenty of rest for the remainder of the day. A responsible individual must stay with you for 24 hours following the procedure.  For the next 24 hours, DO NOT: -Drive a car -Advertising copywriter -Drink alcoholic beverages -Take any medication unless instructed by your physician -Make any legal decisions or sign important papers.  Meals: Start with liquid foods such as gelatin or soup. Progress to regular foods as tolerated. Avoid greasy, spicy, heavy foods. If nausea and/or vomiting occur, drink only clear liquids until the nausea and/or vomiting subsides. Call your physician if vomiting continues.  Special Instructions/Symptoms: Your throat may feel dry or sore from the anesthesia or the breathing tube placed in your throat during surgery. If this causes discomfort, gargle with warm salt water. The discomfort should disappear within 24 hours.  Next dose of Tylenol  can be taken today at 3pm if needed.

## 2024-02-15 NOTE — Interval H&P Note (Signed)
 History and Physical Interval Note:  02/15/2024 8:41 AM  Misty Bullock  has presented today for surgery, with the diagnosis of RIGHT BREAST CANCER.  The various methods of treatment have been discussed with the patient and family. After consideration of risks, benefits and other options for treatment, the patient has consented to  Procedure(s) with comments: BREAST LUMPECTOMY (Right) - RIGHT CENTRAL LUMPECTOMY as a surgical intervention.  The patient's history has been reviewed, patient examined, no change in status, stable for surgery.  I have reviewed the patient's chart and labs.  Questions were answered to the patient's satisfaction.     Lillette Reid III

## 2024-02-15 NOTE — Anesthesia Procedure Notes (Signed)
 Procedure Name: LMA Insertion Date/Time: 02/15/2024 10:27 AM  Performed by: Eugenia Hess, CRNAPre-anesthesia Checklist: Patient identified, Emergency Drugs available, Suction available and Patient being monitored Patient Re-evaluated:Patient Re-evaluated prior to induction Oxygen Delivery Method: Circle System Utilized Preoxygenation: Pre-oxygenation with 100% oxygen Induction Type: IV induction Ventilation: Mask ventilation without difficulty LMA: LMA inserted LMA Size: 4.0 Number of attempts: 1 Airway Equipment and Method: bite block Placement Confirmation: positive ETCO2 Tube secured with: Tape Dental Injury: Teeth and Oropharynx as per pre-operative assessment

## 2024-02-15 NOTE — Transfer of Care (Signed)
 Immediate Anesthesia Transfer of Care Note  Patient: Misty Bullock  Procedure(s) Performed: BREAST LUMPECTOMY (Right: Breast)  Patient Location: PACU  Anesthesia Type:General  Level of Consciousness: sedated  Airway & Oxygen Therapy: Patient Spontanous Breathing and Patient connected to face mask oxygen  Post-op Assessment: Report given to RN and Post -op Vital signs reviewed and stable  Post vital signs: Reviewed and stable  Last Vitals:  Vitals Value Taken Time  BP    Temp    Pulse    Resp    SpO2      Last Pain:  Vitals:   02/15/24 0849  TempSrc: Temporal  PainSc: 0-No pain      Patients Stated Pain Goal: 0 (02/15/24 0849)  Complications: No notable events documented.

## 2024-02-15 NOTE — H&P (Signed)
 MRN: [id number] DOB: [birth year] Subjective    Chief Complaint: Post Operative Visit   History of Present Illness: Misty Bullock is a 68 y.o. female who is seen today for right breast cancer. The patient is a 68 year old white female who is about 3 weeks status post right breast lumpectomy for what was thought to be ductal carcinoma in situ. She had a 4 mm area of invasive cancer which makes her a T1a NX right breast cancer that was ER and PR positive and HER2 negative with a Ki-67 of 1%. She tolerated the surgery well. She denies any significant breast pain. The margins on the invasive cancer were clean but the margin on the DCIS was positive anteriorly which is right behind the nipple    Review of Systems: A complete review of systems was obtained from the patient. I have reviewed this information and discussed as appropriate with the patient. See HPI as well for other ROS.  ROS   Medical History: History reviewed. No pertinent past medical history.  Patient Active Problem List  Diagnosis  Ductal carcinoma in situ (DCIS) of right breast  Malignant neoplasm of central portion of right breast in female, estrogen receptor positive (CMS/HHS-HCC)   Past Surgical History:  Procedure Laterality Date  Left Wrist  MASTECTOMY PARTIAL / LUMPECTOMY    Allergies  Allergen Reactions  Augmentin  [Amoxicillin -Pot Clavulanate] Nausea   Current Outpatient Medications on File Prior to Visit  Medication Sig Dispense Refill  ALPRAZolam  (XANAX ) 0.5 MG tablet Take 1 tablet by mouth at bedtime as needed  amoxicillin -clavulanate (AUGMENTIN ) 875-125 mg tablet Take 1 tablet by mouth 2 (two) times daily  azelastine  (ASTELIN ) 137 mcg nasal spray Place 2 sprays into one nostril 2 (two) times daily  FLUoxetine  (PROZAC ) 40 MG capsule Take 40 mg by mouth once daily  PROLIA 60 mg/mL inj syringe INJECT 60MG  SUBCUTANEOUSLY EVERY 6 MONTHS  valACYclovir (VALTREX) 500 MG tablet TAKE 1 TABLET BY MOUTH TWICE A DAY FOR  3 DAYS  zolpidem  (AMBIEN ) 10 mg tablet TAKE 1/2 TABLET BY MOUTH EVERY DAY AT BEDTIME   No current facility-administered medications on file prior to visit.   History reviewed. No pertinent family history.   Social History   Tobacco Use  Smoking Status Never  Smokeless Tobacco Never    Social History   Socioeconomic History  Marital status: Divorced  Tobacco Use  Smoking status: Never  Smokeless tobacco: Never  Vaping Use  Vaping status: Never Used  Substance and Sexual Activity  Alcohol use: Yes  Drug use: Never   Social Drivers of Corporate investment banker Strain: Low Risk (08/28/2023)  Received from [facility]  Overall Financial Resource Strain (CARDIA)  Difficulty of Paying Living Expenses: Not hard at all  Food Insecurity: No Food Insecurity (01/30/2024)  Received from [facility]  Hunger Vital Sign  Worried About Running Out of Food in the Last Year: Never true  Ran Out of Food in the Last Year: Never true  Transportation Needs: No Transportation Needs (01/30/2024)  Received from [facility] - Transportation  Lack of Transportation (Medical): No  Lack of Transportation (Non-Medical): No  Physical Activity: Inactive (08/28/2023)  Received from [facility]  Exercise Vital Sign  Days of Exercise per Week: 0 days  Minutes of Exercise per Session: 0 min  Stress: No Stress Concern Present (08/28/2023)  Received from [facility] - Occupational Stress Questionnaire  Feeling of Stress : Only a little  Social Connections: Socially Isolated (08/28/2023)  Received from [facility]  Social Connection and Isolation Panel [NHANES]  Frequency of Communication with Friends and Family: More than three times a week  Frequency of Social Gatherings with Friends and Family: More than three times a week  Attends Religious Services: Never  Database administrator or Organizations: No  Attends Banker Meetings:  Never  Marital Status: Never married  Housing Stability: Low Risk (01/30/2024)  Received from [facility]  Housing Stability Vital Sign  Unable to Pay for Housing in the Last Year: No  Number of Times Moved in the Last Year: 0  Homeless in the Last Year: No   Objective:   Vitals:   PainSc: 0-No pain   There is no height or weight on file to calculate BMI.  Physical Exam Vitals reviewed.  Constitutional:  General: She is not in acute distress. Appearance: Normal appearance.  HENT:  Head: Normocephalic and atraumatic.  Right Ear: External ear normal.  Left Ear: External ear normal.  Nose: Nose normal.  Mouth/Throat:  Mouth: Mucous membranes are moist.  Pharynx: Oropharynx is clear.  Eyes:  General: No scleral icterus. Extraocular Movements: Extraocular movements intact.  Conjunctiva/sclera: Conjunctivae normal.  Pupils: Pupils are equal, round, and reactive to light.  Cardiovascular:  Rate and Rhythm: Normal rate and regular rhythm.  Pulses: Normal pulses.  Heart sounds: Normal heart sounds.  Pulmonary:  Effort: Pulmonary effort is normal. No respiratory distress.  Breath sounds: Normal breath sounds.  Abdominal:  General: Bowel sounds are normal.  Palpations: Abdomen is soft.  Tenderness: There is no abdominal tenderness.  Musculoskeletal:  General: No swelling, tenderness or deformity. Normal range of motion.  Cervical back: Normal range of motion and neck supple.  Skin: General: Skin is warm and dry.  Coloration: Skin is not jaundiced.  Neurological:  General: No focal deficit present.  Mental Status: She is alert and oriented to person, place, and time.  Psychiatric:  Mood and Affect: Mood normal.  Behavior: Behavior normal.     Breast: The right breast periareolar incision is healing nicely with no sign of infection or seroma  Labs, Imaging and Diagnostic Testing:  Assessment and Plan:   Diagnoses and all orders for this visit:  Malignant neoplasm  of central portion of right breast in female, estrogen receptor positive (CMS/HHS-HCC) - [facility] Case Posting Request; Future    The patient is about 3 weeks status post right breast lumpectomy for breast cancer. She does have a positive DCIS margin anteriorly which is at the nipple. Because of this my recommendation would be for central lumpectomy to remove the nipple and areola. I have discussed with her in detail the risks and benefits of the operation as well as some of the technical aspects and she understands and wishes to proceed. She will still follow-up with medical and radiation oncology for adjuvant therapy. We will move forward with surgical scheduling

## 2024-02-15 NOTE — Op Note (Signed)
 02/15/2024  11:03 AM  PATIENT:  Misty Bullock  68 y.o. female  PRE-OPERATIVE DIAGNOSIS:  RIGHT BREAST CANCER  POST-OPERATIVE DIAGNOSIS:  RIGHT BREAST CANCER  PROCEDURE:  Procedure(s) with comments: BREAST LUMPECTOMY (Right) - RIGHT CENTRAL LUMPECTOMY  SURGEON:  Surgeons and Role:    * Caralyn Chandler, MD - Primary  PHYSICIAN ASSISTANT:   ASSISTANTS: none   ANESTHESIA:   local and general  EBL:  5 mL   BLOOD ADMINISTERED:none  DRAINS: none   LOCAL MEDICATIONS USED:  MARCAINE      SPECIMEN:  Source of Specimen:  right central lumpectomy  DISPOSITION OF SPECIMEN:  PATHOLOGY  COUNTS:  YES  TOURNIQUET:  * No tourniquets in log *  DICTATION: .Dragon Dictation  After informed consent was obtained the patient was brought to the operating room and placed in the supine position on the operating table.  After adequate induction of general anesthesia the patient's right breast was prepped with ChloraPrep, allowed to dry, and draped in usual sterile manner.  An appropriate timeout was performed.  The area around the nipple and areola complex was infiltrated with quarter percent Marcaine  until a good field block was created.  An elliptical incision was made with a 15 blade knife around the nipple and areola complex.  The incision was carried through the skin and subcutaneous tissue sharply with the electrocautery.  Dissection was then carried down to and through the old lumpectomy cavity.  Most of the previous lumpectomy cavity was removed with the specimen.  Once the tissue was removed it was oriented with the appropriate paint colors.  Only a small bit of the medial portion of the cavity was left.  The specimen was then sent to pathology for further evaluation.  Hemostasis was achieved using the Bovie electrocautery.  The wound was irrigated with saline and infiltrated with more quarter percent Marcaine .  The deep layer of the incision was then closed with interrupted 3-0 Vicryl stitches.  The  skin was then closed with a running 4-0 Monocryl subcuticular stitch.  Dermabond dressings were applied.  The patient tolerated the procedure well.  At the end of the case all needle sponge and instrument counts were correct.  The patient was then awakened and taken to recovery in stable condition.  PLAN OF CARE: Discharge to home after PACU  PATIENT DISPOSITION:  PACU - hemodynamically stable.   Delay start of Pharmacological VTE agent (>24hrs) due to surgical blood loss or risk of bleeding: not applicable

## 2024-02-16 ENCOUNTER — Encounter (HOSPITAL_BASED_OUTPATIENT_CLINIC_OR_DEPARTMENT_OTHER): Payer: Self-pay | Admitting: General Surgery

## 2024-02-19 LAB — SURGICAL PATHOLOGY

## 2024-02-20 ENCOUNTER — Encounter: Payer: Self-pay | Admitting: *Deleted

## 2024-02-21 ENCOUNTER — Ambulatory Visit: Payer: Self-pay | Admitting: General Surgery

## 2024-02-26 DIAGNOSIS — H353221 Exudative age-related macular degeneration, left eye, with active choroidal neovascularization: Secondary | ICD-10-CM | POA: Diagnosis not present

## 2024-03-01 DIAGNOSIS — L570 Actinic keratosis: Secondary | ICD-10-CM | POA: Diagnosis not present

## 2024-03-01 DIAGNOSIS — D692 Other nonthrombocytopenic purpura: Secondary | ICD-10-CM | POA: Diagnosis not present

## 2024-03-01 DIAGNOSIS — L821 Other seborrheic keratosis: Secondary | ICD-10-CM | POA: Diagnosis not present

## 2024-03-01 DIAGNOSIS — Z85828 Personal history of other malignant neoplasm of skin: Secondary | ICD-10-CM | POA: Diagnosis not present

## 2024-03-01 DIAGNOSIS — L814 Other melanin hyperpigmentation: Secondary | ICD-10-CM | POA: Diagnosis not present

## 2024-03-01 DIAGNOSIS — C44722 Squamous cell carcinoma of skin of right lower limb, including hip: Secondary | ICD-10-CM | POA: Diagnosis not present

## 2024-03-12 NOTE — Progress Notes (Signed)
 New Breast Cancer Diagnosis: Malignant neoplasm of areola of right breast in female, estrogen receptor positive (HCC).   Did patient present with symptoms (if so, please note symptoms) or screening mammography? Screening found a 1.9 cm area of calcification.   Location and extent of disease: Located at medial retroareolar aspect of the right breast, measured 1.9 cm in greatest dimension. Adenopathy {:18581}.  Histology per Pathology Report: Grade: 1 / Final stage: 1A FINAL MICROSCOPIC DIAGNOSIS:   A. BREAST, RIGHT CENTRAL, LUMPECTOMY:       Benign breast tissue with fibrocystic changes including stromal  fibrosis and cysts.       Robust giant cell reactions, fat necrosis and histiocytic  inflammation consistent with prior procedure changes.       Benign skin without significant diagnostic alteration.       Negative for atypia or malignancy. , Invasive Ductal Carcinoma  Receptor Status: ER(positive), PR: (positive), Her2-neu: (negative), Ki-Ki: (67 of 1%)   Surgeon and surgical plan, if any: Dr. Alethea Andes -Breast Lumpectomy with radioactive seed localization 01/12/2024  Medical oncologist, treatment if any:  Cameron Cea, MD Treatment plan: Adjuvant radiation therapy Adjuvant antiestrogen therapy  Family History of Breast/Ovarian/Prostate Cancer: No.  Lymphedema issues, if any:  {:18581} {t:21944}   Pain issues, if any:  {:18581} {PAIN DESCRIPTION:21022940}  SAFETY ISSUES: Prior radiation? No Pacemaker/ICD? No Possible current pregnancy? NO- Postmenopausal Is the patient on methotrexate? No  Current Complaints / other details:  ***   This concludes the interaction.  Avery Bodo, LPN

## 2024-03-13 ENCOUNTER — Inpatient Hospital Stay: Attending: Hematology and Oncology | Admitting: Hematology and Oncology

## 2024-03-13 ENCOUNTER — Ambulatory Visit: Admitting: Hematology and Oncology

## 2024-03-13 VITALS — BP 124/80 | HR 73 | Temp 97.4°F | Resp 16 | Ht 66.0 in | Wt 119.3 lb

## 2024-03-13 DIAGNOSIS — Z79811 Long term (current) use of aromatase inhibitors: Secondary | ICD-10-CM | POA: Diagnosis not present

## 2024-03-13 DIAGNOSIS — Z170 Estrogen receptor positive status [ER+]: Secondary | ICD-10-CM | POA: Insufficient documentation

## 2024-03-13 DIAGNOSIS — C50011 Malignant neoplasm of nipple and areola, right female breast: Secondary | ICD-10-CM | POA: Insufficient documentation

## 2024-03-13 DIAGNOSIS — M810 Age-related osteoporosis without current pathological fracture: Secondary | ICD-10-CM | POA: Insufficient documentation

## 2024-03-13 DIAGNOSIS — C50111 Malignant neoplasm of central portion of right female breast: Secondary | ICD-10-CM | POA: Diagnosis present

## 2024-03-13 MED ORDER — ANASTROZOLE 1 MG PO TABS: 1.0000 mg | ORAL_TABLET | Freq: Every day | ORAL | 3 refills | Status: AC

## 2024-03-13 NOTE — Progress Notes (Addendum)
 Patient Care Team: Rodney Clamp, MD as PCP - General (Family Medicine) Dema Filler, MD as Consulting Physician (Ophthalmology) Merryl Abraham, MD as Consulting Physician (Obstetrics and Gynecology) [place], Amy, MD as Consulting Physician (Dermatology) Auther Bo, RN as Oncology Nurse Navigator Alane Hsu, RN as Oncology Nurse Navigator Cameron Cea, MD as Consulting Physician (Hematology and Oncology)  DIAGNOSIS:  Encounter Diagnosis  Name Primary?   Malignant neoplasm of areola of right breast in female, estrogen receptor positive (HCC) Yes    SUMMARY OF ONCOLOGIC HISTORY: Oncology History  Malignant neoplasm of areola of right breast in female, estrogen receptor positive (HCC)  12/20/2023 Initial Diagnosis   Screening mammogram detected indeterminate calcifications measuring 1.9 cm: Biopsy: High-grade DCIS ER 90%, PR 90%   01/12/2024 Surgery   Right lumpectomy: Grade 1 IDC 0.4 cm with high-grade DCIS, margins negative, LVI not identified, ER 80%, PR 95 %, Ki-67 1%, HER2 1+ negative     CHIEF COMPLIANT: Follow-up to discuss adjuvant treatment plan  HISTORY OF PRESENT ILLNESS:  History of Present Illness Misty Bullock is a 68 year old female with ductal carcinoma in situ (DCIS) who presents to discuss radiation therapy options.  He was diagnosed with ductal carcinoma in situ (DCIS) in March and underwent a central lumpectomy. Pathology showed benign findings with no cancer detected in the skin. He is considering radiation therapy but is concerned about side effects and future treatment options if cancer recurs. He is hesitant to proceed with radiation due to information that it cannot be repeated in the same area, which would necessitate a mastectomy if cancer returns. He prefers to take an antiestrogen pill, anastrozole, at a dose of 1 mg once daily for five years, and is aware of potential side effects such as hot flashes and joint stiffness. He receives Prolia  injections every six months and takes calcium and vitamin D  supplements for osteoporosis. He recently had a squamous cell carcinoma removed, which was successfully excised. No significant pain currently.     ALLERGIES:  is allergic to latex and augmentin  [amoxicillin -pot clavulanate].  MEDICATIONS:  Current Outpatient Medications  Medication Sig Dispense Refill   anastrozole (ARIMIDEX) 1 MG tablet Take 1 tablet (1 mg total) by mouth daily. 90 tablet 3   azelastine  (ASTELIN ) 0.1 % nasal spray Place 2 sprays into the nose.     denosumab (PROLIA) 60 MG/ML SOSY injection Prolia 60 mg/mL subcutaneous syringe  Dispense 1 prefilled syringe to MD office for SQ injection Q 6 months     FLUoxetine  (PROZAC ) 40 MG capsule TAKE 1 CAPSULE (40 MG TOTAL) BY MOUTH DAILY. 90 capsule 0   zolpidem  (AMBIEN ) 10 MG tablet TAKE 1/2 TABLET BY MOUTH EVERY DAY AT BEDTIME 45 tablet 1   ALPRAZolam  (XANAX ) 0.5 MG tablet TAKE 1 TABLET BY MOUTH AT BEDTIME AS NEEDED FOR ANXIETY (Patient not taking: Reported on 03/13/2024) 30 tablet 5   valACYclovir (VALTREX) 500 MG tablet valacyclovir 500 mg tablet (Patient not taking: Reported on 03/13/2024)     No current facility-administered medications for this visit.    PHYSICAL EXAMINATION: ECOG PERFORMANCE STATUS: 1 - Symptomatic but completely ambulatory  Vitals:   03/13/24 1400  BP: 124/80  Pulse: 73  Resp: 16  Temp: (!) 97.4 F (36.3 C)  SpO2: 100%   Filed Weights   03/13/24 1400  Weight: 119 lb 4.8 oz (54.1 kg)    Physical Exam   (exam performed in the presence of a chaperone)  LABORATORY DATA:  I have reviewed the data as listed    Latest Ref Rng & Units 08/31/2023   11:31 AM 06/03/2022    1:56 PM 06/01/2021    2:12 PM  CMP  Glucose 70 - 99 mg/dL 87  86  98   BUN 6 - 23 mg/dL 12  10  13    Creatinine 0.40 - 1.20 mg/dL 1.61  0.96  0.45   Sodium 135 - 145 mEq/L 136  133  135   Potassium 3.5 - 5.1 mEq/L 4.5  5.1  4.5   Chloride 96 - 112 mEq/L 97  93  97    CO2 19 - 32 mEq/L 28  29  29    Calcium 8.4 - 10.5 mg/dL 9.9  9.8  9.8   Total Protein 6.0 - 8.3 g/dL 7.5  7.1  7.3   Total Bilirubin 0.2 - 1.2 mg/dL 0.5  0.8  0.5   Alkaline Phos 39 - 117 U/L 56  48  42   AST 0 - 37 U/L 37  27  22   ALT 0 - 35 U/L 18  13  10      Lab Results  Component Value Date   WBC 4.5 08/31/2023   HGB 15.7 (H) 08/31/2023   HCT 47.1 (H) 08/31/2023   MCV 99.6 08/31/2023   PLT 244.0 08/31/2023   NEUTROABS 2,337 06/03/2020    ASSESSMENT & PLAN:  Malignant neoplasm of areola of right breast in female, estrogen receptor positive (HCC) 12/20/2023:Screening mammogram detected indeterminate calcifications measuring 1.9 cm: Biopsy: High-grade DCIS ER 90%, PR 90%    01/12/2024:Right lumpectomy: Grade 1 IDC 0.4 cm with high-grade DCIS, margins negative, LVI not identified, ER 80%, PR 95 %, Ki-67 1%, HER2 1+ negative  02/15/2024: Right central lumpectomy: Benign (performed because the margin for DCIS on the earlier lumpectomy was positive)  Treatment plan: Adjuvant radiation therapy: Patient does not want to do radiation.  I sent a message to Dr. Jeryl Moris to cancel her appointments tomorrow. Adjuvant antiestrogen therapy with anastrozole 1 mg daily started 03/13/2024  Anastrozole counseling: We discussed the risks and benefits of anti-estrogen therapy with aromatase inhibitors. These include but not limited to insomnia, hot flashes, mood changes, vaginal dryness, bone density loss, and weight gain. We strongly believe that the benefits far outweigh the risks. Patient understands these risks and consented to starting treatment. Planned treatment duration is 5 years.    She is currently retired and helping take care of her mother who is in a assisted living facility Return to clinic after radiation is completed ------------------------------------- Assessment and Plan Assessment & Plan Malignant neoplasm of areola of right breast, estrogen receptor positive Early stage estrogen  receptor-positive neoplasm. Central lumpectomy benign. Discussed PRIME II study; no survival difference with or without radiation, but higher local recurrence without radiation. He opts for antiestrogen therapy, understanding 92% probability of no recurrence without radiation. - Prescribe anastrozole 1 mg once daily for five years. - Monitor for side effects such as hot flashes and joint stiffness. - Continue Prolia every six months for osteoporosis. - Advise calcium and vitamin D  supplementation. - Encourage regular walking for exercise. - Perform bone density scan every two years. - Cancel radiation therapy appointment.  Osteoporosis Osteoporosis managed with Prolia. Elevated vitamin D  levels noted; supplementation paused. - Continue Prolia every six months. - Advise calcium and vitamin D  supplementation as needed. - Perform bone density scan every two years.  Squamous cell carcinoma Recently treated with scraping; all cancerous tissue removed.  Goals of Care Prefers to avoid radiation for breast cancer, accepting 9% local recurrence risk,  Willing to monitor and consider future interventions.      No orders of the defined types were placed in this encounter.  The patient has a good understanding of the overall plan. he agrees with it. he will call with any problems that may develop before the next visit here. Total time spent: 30 mins including face to face time and time spent for planning, charting and co-ordination of care   Margert Sheerer, MD 03/13/24

## 2024-03-13 NOTE — Assessment & Plan Note (Signed)
 12/20/2023:Screening mammogram detected indeterminate calcifications measuring 1.9 cm: Biopsy: High-grade DCIS ER 90%, PR 90%    01/12/2024:Right lumpectomy: Grade 1 IDC 0.4 cm with high-grade DCIS, margins negative, LVI not identified, ER 80%, PR 95 %, Ki-67 1%, HER2 1+ negative  02/15/2024: Right central lumpectomy: Benign (performed because the margin for DCIS on the earlier lumpectomy was positive)  Treatment plan: Adjuvant radiation therapy Adjuvant antiestrogen therapy   She is currently retired and helping take care of her mother who is in a assisted living facility Return to clinic after radiation is completed

## 2024-03-14 ENCOUNTER — Ambulatory Visit: Admitting: Radiation Oncology

## 2024-03-14 ENCOUNTER — Ambulatory Visit

## 2024-03-14 ENCOUNTER — Encounter: Payer: Self-pay | Admitting: *Deleted

## 2024-03-14 ENCOUNTER — Ambulatory Visit
Admission: RE | Admit: 2024-03-14 | Discharge: 2024-03-14 | Disposition: A | Discharge status: Home / Self Care | Source: Ambulatory Visit | Attending: Radiation Oncology | Admitting: Radiation Oncology

## 2024-03-14 DIAGNOSIS — C50011 Malignant neoplasm of nipple and areola, right female breast: Secondary | ICD-10-CM

## 2024-03-14 DIAGNOSIS — Z170 Estrogen receptor positive status [ER+]: Secondary | ICD-10-CM

## 2024-03-18 NOTE — Progress Notes (Signed)
 Noted that Dr. Lee Public reached out after seeing the patient and pt has requested to cancel her appointment with us  and forgo radiation. We would be happy to revisit this discussion if she desires this as we would have offered radiotherapy given her final breast pathology.     Shelvia Dick, PAC

## 2024-03-19 ENCOUNTER — Ambulatory Visit: Admitting: Hematology and Oncology

## 2024-03-27 ENCOUNTER — Other Ambulatory Visit: Payer: Self-pay | Admitting: Family Medicine

## 2024-03-28 ENCOUNTER — Ambulatory Visit: Payer: Self-pay

## 2024-03-28 NOTE — Telephone Encounter (Signed)
 FYI Only or Action Required?: FYI only for provider.  Patient was last seen in primary care on 01/01/2024 by Rodney Clamp, MD. Called Nurse Triage reporting Numbness. Symptoms began several months ago. Interventions attempted: Nothing. Symptoms are: unchanged.  Triage Disposition: See PCP When Office is Open (Within 3 Days)  Patient/caregiver understands and will follow disposition?: Yes              Copied from CRM [id number]. Topic: Clinical - Red Word Triage >> Mar 28, 2024  2:10 PM Kita Perish H wrote: [facility] that prompted transfer to Nurse Triage: Feet are tingly, cold, weird feeling and numb Reason for Disposition  [1] Numbness or tingling in one or both feet AND [2] is a chronic symptom (recurrent or ongoing AND present > 4 weeks)  Answer Assessment - Initial Assessment Questions 1. SYMPTOM: What is the main symptom you are concerned about? (e.g., weakness, numbness)      Tingling and numbness 2. ONSET: When did this start? (minutes, hours, days; while sleeping)     A couple months 3. LAST NORMAL: When was the last time you (the patient) were normal (no symptoms)?     4 months ago 4. PATTERN Does this come and go, or has it been constant since it started?  Is it present now?     always 5. CARDIAC SYMPTOMS: Have you had any of the following symptoms: chest pain, difficulty breathing, palpitations?     no 6. NEUROLOGIC SYMPTOMS: Have you had any of the following symptoms: headache, dizziness, vision loss, double vision, changes in speech, unsteady on your feet?     dizziness 7. OTHER SYMPTOMS: Do you have any other symptoms?     no  Protocols used: Neurologic Deficit-A-AH

## 2024-03-29 ENCOUNTER — Ambulatory Visit: Admitting: Family Medicine

## 2024-03-29 ENCOUNTER — Encounter: Payer: Self-pay | Admitting: Family Medicine

## 2024-03-29 VITALS — BP 123/77 | HR 75 | Temp 97.7°F | Ht 66.0 in | Wt 117.6 lb

## 2024-03-29 DIAGNOSIS — G629 Polyneuropathy, unspecified: Secondary | ICD-10-CM

## 2024-03-29 DIAGNOSIS — E559 Vitamin D deficiency, unspecified: Secondary | ICD-10-CM | POA: Insufficient documentation

## 2024-03-29 DIAGNOSIS — M810 Age-related osteoporosis without current pathological fracture: Secondary | ICD-10-CM | POA: Diagnosis not present

## 2024-03-29 DIAGNOSIS — E538 Deficiency of other specified B group vitamins: Secondary | ICD-10-CM | POA: Diagnosis not present

## 2024-03-29 LAB — COMPREHENSIVE METABOLIC PANEL WITH GFR
ALT: 22 U/L (ref 0–35)
AST: 43 U/L — ABNORMAL HIGH (ref 0–37)
Albumin: 4.3 g/dL (ref 3.5–5.2)
Alkaline Phosphatase: 47 U/L (ref 39–117)
BUN: 15 mg/dL (ref 6–23)
CO2: 29 meq/L (ref 19–32)
Calcium: 9.3 mg/dL (ref 8.4–10.5)
Chloride: 101 meq/L (ref 96–112)
Creatinine, Ser: 0.73 mg/dL (ref 0.40–1.20)
GFR: 83.65 mL/min (ref >60.00)
Glucose, Bld: 92 mg/dL (ref 70–99)
Potassium: 4.3 meq/L (ref 3.5–5.1)
Sodium: 139 meq/L (ref 135–145)
Total Bilirubin: 0.5 mg/dL (ref 0.2–1.2)
Total Protein: 6.9 g/dL (ref 6.0–8.3)

## 2024-03-29 LAB — TSH: TSH: 0.8 u[IU]/mL (ref 0.35–5.50)

## 2024-03-29 LAB — CBC
HCT: 44.3 % (ref 36.0–46.0)
Hemoglobin: 14.7 g/dL (ref 12.0–15.0)
MCHC: 33.2 g/dL (ref 30.0–36.0)
MCV: 98.5 fl (ref 78.0–100.0)
Platelets: 236 10*3/uL (ref 150.0–400.0)
RBC: 4.5 Mil/uL (ref 3.87–5.11)
RDW: 14.1 % (ref 11.5–15.5)
WBC: 4.4 10*3/uL (ref 4.0–10.5)

## 2024-03-29 LAB — VITAMIN D 25 HYDROXY (VIT D DEFICIENCY, FRACTURES): VITD: 66.24 ng/mL (ref 30.00–100.00)

## 2024-03-29 LAB — VITAMIN B12: Vitamin B-12: 370 pg/mL (ref 211–911)

## 2024-03-29 LAB — FOLATE: Folate: 3.3 ng/mL — ABNORMAL LOW (ref >5.9)

## 2024-03-29 LAB — HEMOGLOBIN A1C: Hgb A1c MFr Bld: 5.4 % (ref 4.6–6.5)

## 2024-03-29 NOTE — Assessment & Plan Note (Signed)
 On Prolia per GYN.  She stopped vitamin D  supplementation several months ago due to elevated levels.  Will recheck today though would be a good idea for her to restart low-dose and 100 IUs daily.

## 2024-03-29 NOTE — Patient Instructions (Signed)
 It was very nice to see you today!  You have neuropathy.  I believe this is probably due to a vitamin D  deficiency.  We will check labs today.  Return if symptoms worsen or fail to improve.   Take care, Dr Daneil Dunker  PLEASE NOTE:  If you had any lab tests, please let us  know if you have not heard back within a few days. You may see your results on mychart before we have a chance to review them but we will give you a call once they are reviewed by us .   If we ordered any referrals today, please let us  know if you have not heard from their office within the next week.   If you had any urgent prescriptions sent in today, please check with the pharmacy within an hour of our visit to make sure the prescription was transmitted appropriately.   Please try these tips to maintain a healthy lifestyle:  Eat at least 3 REAL meals and 1-2 snacks per day.  Aim for no more than 5 hours between eating.  If you eat breakfast, please do so within one hour of getting up.   Each meal should contain half fruits/vegetables, one quarter protein, and one quarter carbs (no bigger than a computer mouse)  Cut down on sweet beverages. This includes juice, soda, and sweet tea.   Drink at least 1 glass of water with each meal and aim for at least 8 glasses per day  Exercise at least 150 minutes every week.

## 2024-03-29 NOTE — Assessment & Plan Note (Signed)
Check vitamin D as above.

## 2024-03-29 NOTE — Progress Notes (Signed)
   Misty Bullock is a 68 y.o. female who presents today for an office visit.  Assessment/Plan:  New/Acute Problems: Neuropathy Exam and history consistent with peripheral neuropathy.  She does have history of low B12 and distribution of symptoms are consistent with metabolic etiology.  We will check labs today including CBC, c-Met, folate, B12, TSH, and A1c.  If labs are negative would consider imaging versus referral to neurology to evaluate for compressive neuropathy.  Chronic Problems Addressed Today: Osteoporosis On Prolia per GYN.  She stopped vitamin D  supplementation several months ago due to elevated levels.  Will recheck today though would be a good idea for her to restart low-dose and 100 IUs daily.  Vitamin B12 deficiency B12 was low at 102 when last checked in 2023.  This is likely contributing to her above neuropathy.  Will recheck labs today and start replacement if necessary.  Avitaminosis D Check vitamin D  as above.     Subjective:  HPI:  See A/P for status of chronic conditions.  Patient is here today with numbness and tingling in her feet. This has been going on for months. Located in both feet. Feel in both feet. Mostly near her toes bilaterally. Staying the same the last few months.  No obvious triggering events.  She has not noticed anything that makes the symptoms better or worse.         Objective:  Physical Exam: BP 123/77   Pulse 75   Temp 97.7 F (36.5 C) (Temporal)   Ht 5' 6 (1.676 m)   Wt 117 lb 9.6 oz (53.3 kg)   LMP  (LMP Unknown)   SpO2 99%   BMI 18.98 kg/m   Gen: No acute distress, resting comfortably  MUSCULOSKELETAL - Feet: No deformities.  DP and PT pulses 2+ and symmetric bilaterally.  Sensation light touch intact throughout however slightly diminished monofilament testing along toes bilaterally.  Decreased sense of vibration at the toes bilaterally. Neuro: Grossly normal, moves all extremities Psych: Normal affect and thought content       Cache Decoursey M. Daneil Dunker, MD 03/29/2024 12:19 PM

## 2024-03-29 NOTE — Assessment & Plan Note (Signed)
 B12 was low at 102 when last checked in 2023.  This is likely contributing to her above neuropathy.  Will recheck labs today and start replacement if necessary.

## 2024-03-29 NOTE — Telephone Encounter (Signed)
 Appt today

## 2024-04-01 ENCOUNTER — Ambulatory Visit: Payer: Self-pay | Admitting: Family Medicine

## 2024-04-01 ENCOUNTER — Telehealth: Payer: Self-pay | Admitting: *Deleted

## 2024-04-01 DIAGNOSIS — R748 Abnormal levels of other serum enzymes: Secondary | ICD-10-CM

## 2024-04-01 NOTE — Progress Notes (Signed)
 Her folate is low.  This may be causing some of her issues.  Recommend that she start supplementation of 5 mcg daily.  She should be able to get this over-the-counter.  We should recheck in 3 to 6 months.  One of her liver numbers is slightly elevated.  Recommend she come back to recheck in a couple of weeks.  Please place future order for CMET.  Her B12 is in the lower range of normal.  Recommend she start B12 supplement 1000 mcg daily as well.  We can also recheck this in 3 to 6 months.  The rest of her labs are at goal and we can recheck in a year.

## 2024-04-01 NOTE — Telephone Encounter (Signed)
 Copied from CRM [id number]. Topic: Clinical - Lab/Test Results >> Apr 01, 2024 12:02 PM Thersia BROCKS wrote: Reason for CRM: Patient called in would like a nurse to give her a callback regarding her lab results   See results note  Rush Salce,RMA

## 2024-04-02 ENCOUNTER — Ambulatory Visit: Admitting: Plastic Surgery

## 2024-04-02 ENCOUNTER — Encounter: Payer: Self-pay | Admitting: Plastic Surgery

## 2024-04-02 VITALS — BP 103/71 | HR 71 | Ht 66.0 in | Wt 118.7 lb

## 2024-04-02 DIAGNOSIS — C50011 Malignant neoplasm of nipple and areola, right female breast: Secondary | ICD-10-CM

## 2024-04-02 DIAGNOSIS — Z170 Estrogen receptor positive status [ER+]: Secondary | ICD-10-CM | POA: Diagnosis not present

## 2024-04-02 DIAGNOSIS — N651 Disproportion of reconstructed breast: Secondary | ICD-10-CM | POA: Diagnosis not present

## 2024-04-02 DIAGNOSIS — D0511 Intraductal carcinoma in situ of right breast: Secondary | ICD-10-CM

## 2024-04-02 NOTE — Progress Notes (Signed)
 Patient ID: Misty Bullock, female    DOB: 06-24-1956, 68 y.o.   MRN: [id number]   Chief Complaint  Patient presents with   consult   Breast Problem    The patient is a 68 year old female here for evaluation of her breast.  The patient had right breast cancer and underwent a partial mastectomy.  She is on medication but will not be getting radiation.  She has significant asymmetry and a square shape to the right breast.  She is hoping for some better symmetry so that she can wear 1 bra and fit into it comfortably.  Her surgery was in May so we could do this at the end of July.    Review of Systems  Constitutional: Negative.   HENT: Negative.    Eyes: Negative.   Respiratory: Negative.    Cardiovascular: Negative.   Gastrointestinal: Negative.   Endocrine: Negative.   Genitourinary: Negative.     Past Medical History:  Diagnosis Date   Anxiety    History of chicken pox    Insomnia    Restless leg syndrome     Past Surgical History:  Procedure Laterality Date   bilateral blerphoplasty      BREAST BIOPSY Right 12/20/2023   MM RT BREAST BX W LOC DEV 1ST LESION IMAGE BX SPEC STEREO GUIDE 12/20/2023 [facility]   BREAST BIOPSY  01/11/2024   MM RT RADIOACTIVE SEED LOC MAMMO GUIDE 01/11/2024 [facility]   BREAST LUMPECTOMY Right 02/15/2024   Procedure: BREAST LUMPECTOMY;  Surgeon: Curvin Deward MOULD, MD;  Location: [facility];  Service: General;  Laterality: Right;  RIGHT CENTRAL LUMPECTOMY   BREAST LUMPECTOMY WITH RADIOACTIVE SEED LOCALIZATION Right 01/12/2024   Procedure: BREAST LUMPECTOMY WITH RADIOACTIVE SEED LOCALIZATION;  Surgeon: Curvin Deward MOULD, MD;  Location: [facility];  Service: General;  Laterality: Right;   cataract surgery      OPEN REDUCTION INTERNAL FIXATION (ORIF) DISTAL RADIAL FRACTURE Left 03/03/2017   Procedure: OPEN REDUCTION INTERNAL FIXATION (ORIF) LEFT DISTAL RADIUS FRACTURE;  Surgeon: Vernetta Lonni GRADE, MD;  Location: [facility];  Service: Orthopedics;  Laterality: Left;   WISDOM TOOTH EXTRACTION        Current Outpatient Medications:    ALPRAZolam  (XANAX ) 0.5 MG tablet, TAKE 1 TABLET BY MOUTH AT BEDTIME AS NEEDED FOR ANXIETY, Disp: 30 tablet, Rfl: 5   anastrozole  (ARIMIDEX ) 1 MG tablet, Take 1 tablet (1 mg total) by mouth daily., Disp: 90 tablet, Rfl: 3   denosumab (PROLIA) 60 MG/ML SOSY injection, Prolia 60 mg/mL subcutaneous syringe  Dispense 1 prefilled syringe to MD office for SQ injection Q 6 months, Disp: , Rfl:    FLUoxetine  (PROZAC ) 40 MG capsule, TAKE 1 CAPSULE (40 MG TOTAL) BY MOUTH DAILY., Disp: 90 capsule, Rfl: 0   zolpidem  (AMBIEN ) 10 MG tablet, TAKE 1/2 TABLET BY MOUTH EVERY DAY AT BEDTIME, Disp: 45 tablet, Rfl: 1   azelastine  (ASTELIN ) 0.1 % nasal spray, Place 2 sprays into the nose., Disp: , Rfl:    valACYclovir (VALTREX) 500 MG tablet, valacyclovir 500 mg tablet (Patient not taking: Reported on 04/02/2024), Disp: , Rfl:    Objective:   Vitals:   04/02/24 1350  BP: 103/71  Pulse: 71  SpO2: 98%    Physical Exam Vitals reviewed.  Constitutional:      Appearance: Normal appearance.   Cardiovascular:     Rate and Rhythm: Normal rate.     Pulses: Normal pulses.  Pulmonary:  Effort: Pulmonary effort is normal.  Abdominal:     Palpations: Abdomen is soft.   Skin:    General: Skin is warm.     Capillary Refill: Capillary refill takes less than 2 seconds.     Coloration: Skin is not jaundiced.     Findings: No bruising or lesion.   Neurological:     Mental Status: He is alert and oriented to person, place, and time.   Psychiatric:        Mood and Affect: Mood normal.        Behavior: Behavior normal.        Thought Content: Thought content normal.        Judgment: Judgment normal.     Assessment & Plan:  Malignant neoplasm of areola of right breast in female, estrogen receptor positive (HCC)  Ductal carcinoma in situ (DCIS) of right breast  I think the patient would  benefit from excision of excess right breast tissue for better shape and symmetry and a left breast mastopexy.  The patient is in agreement and then would like to do nipple areola tattoo after surgery.  Pictures were obtained of the patient and placed in the chart with the patient's or guardian's permission.  Misty RAMAN Avis Tirone, DO

## 2024-04-03 ENCOUNTER — Ambulatory Visit: Payer: Self-pay

## 2024-04-03 DIAGNOSIS — N3001 Acute cystitis with hematuria: Secondary | ICD-10-CM | POA: Diagnosis not present

## 2024-04-03 NOTE — Telephone Encounter (Signed)
 Spoke with patient, patient present in a UC

## 2024-04-03 NOTE — Telephone Encounter (Signed)
 FYI Only or Action Required?: FYI only for provider.  Patient was last seen in primary care on 03/29/2024 by Kennyth Worth HERO, MD. Called Nurse Triage reporting burning with urination, Urinary Urgency, and Urinary Frequency. Symptoms began yesterday. Interventions attempted: Rest, hydration, or home remedies. Symptoms are: unchanged.  Triage Disposition: See HCP Within 4 Hours (Or PCP Triage)-to Urgent Care  Patient/caregiver understands and will follow disposition?: Yes  Copied from CRM [id number]. Topic: Clinical - Red Word Triage >> Apr 03, 2024 12:20 PM Lavanda D wrote: Red Word that prompted transfer to Nurse Triage: Patient calling and is experiencing UTI symptoms, burning while urinating + urgency. Started yesterday. Reason for Disposition  [1] SEVERE pain with urination (e.g., excruciating) AND [2] not improved after 2 hours of pain medicine and Sitz bath  Answer Assessment - Initial Assessment Questions 1. SEVERITY: How bad is the pain?  (e.g., Scale 1-10; mild, moderate, or severe)   - MILD (1-3): complains slightly about urination hurting   - MODERATE (4-7): interferes with normal activities     - SEVERE (8-10): excruciating, unwilling or unable to urinate because of the pain      8 to 9 out of 10 2. FREQUENCY: How many times have you had painful urination today?      Pain with urination every time today 3. PATTERN: Is pain present every time you urinate or just sometimes?      Present when she urinates 4. ONSET: When did the painful urination start?      Started today 5. FEVER: Do you have a fever? If Yes, ask: What is your temperature, how was it measured, and when did it start?     no 6. PAST UTI: Have you had a urine infection before? If Yes, ask: When was the last time? and What happened that time?      Has had a UTI before but has been a long time 7. CAUSE: What do you think is causing the painful urination?  (e.g., UTI, scratch, Herpes sore)      Possible UTI 8. OTHER SYMPTOMS: Do you have any other symptoms? (e.g., blood in urine, flank pain, genital sores, urgency, vaginal discharge)     Urgency, urine appeared cloudy to patient  Protocols used: Urination Pain - Female-A-AH

## 2024-04-09 ENCOUNTER — Encounter: Payer: Self-pay | Admitting: Surgical

## 2024-04-09 ENCOUNTER — Encounter (HOSPITAL_BASED_OUTPATIENT_CLINIC_OR_DEPARTMENT_OTHER): Payer: Self-pay | Admitting: Plastic Surgery

## 2024-04-09 ENCOUNTER — Ambulatory Visit (INDEPENDENT_AMBULATORY_CARE_PROVIDER_SITE_OTHER): Admitting: Surgical

## 2024-04-09 ENCOUNTER — Other Ambulatory Visit: Payer: Self-pay

## 2024-04-09 VITALS — BP 119/78 | HR 93 | Ht 66.0 in | Wt 119.0 lb

## 2024-04-09 DIAGNOSIS — Z170 Estrogen receptor positive status [ER+]: Secondary | ICD-10-CM

## 2024-04-09 DIAGNOSIS — C50011 Malignant neoplasm of nipple and areola, right female breast: Secondary | ICD-10-CM

## 2024-04-09 DIAGNOSIS — D0511 Intraductal carcinoma in situ of right breast: Secondary | ICD-10-CM

## 2024-04-09 MED ORDER — CEPHALEXIN 500 MG PO CAPS: 500.0000 mg | ORAL_CAPSULE | Freq: Four times a day (QID) | ORAL | 0 refills | Status: AC

## 2024-04-09 MED ORDER — OXYCODONE HCL 5 MG PO TABS: 5.0000 mg | ORAL_TABLET | Freq: Three times a day (TID) | ORAL | 0 refills | Status: AC | PRN

## 2024-04-09 MED ORDER — ONDANSETRON HCL 4 MG PO TABS: 4.0000 mg | ORAL_TABLET | Freq: Three times a day (TID) | ORAL | 0 refills | Status: AC | PRN

## 2024-04-09 NOTE — Progress Notes (Signed)
 Patient ID: Misty Bullock, female    DOB: 31-Dec-1955, 68 y.o.   MRN: [id number]  Chief Complaint  Patient presents with   Pre-op Exam      ICD-10-CM   1. Malignant neoplasm of areola of right breast in female, estrogen receptor positive (HCC)  C50.011    Z17.0     2. Ductal carcinoma in situ (DCIS) of right breast  D05.11       History of Present Illness: Misty Bullock is a 68 y.o.  female  with a history of right breast partial mastectomy.  He presents for preoperative evaluation for upcoming procedure, excision excess tissue of right breast, left breast mastopexy, scheduled for 04/17/2024 with Dr. Lowery.  The patient has not had problems with anesthesia. No history of DVT/PE.  No family history of DVT/PE.  No family or personal history of bleeding or clotting disorders.  Patient is not currently taking any blood thinners.  No history of CVA/MI.   Denies any history of Crohn's or ulcerative colitis.  She has tolerated surgery well in the past without any issues.  Summary of Previous Visit: The patient is a 68 year old female here for evaluation of her breast. The patient had right breast cancer and underwent a partial mastectomy. She is on medication but will not be getting radiation. She has significant asymmetry and a square shape to the right breast. She is hoping for some better symmetry   PMH Significant for: osteoporosis, neuropathy (peripheral), currently on anastrazole, previous right lumpectomy 01/12/2024. SCC  She has opted out of radiation therapy to right breast.  Recent CBC normal. CMP overall normal, slightly elevated AST at 43. A1c 5.4  She reports she is overall been feeling well lately, does report that she had a UTI recently, is currently on antibiotics for this.  She reports symptoms have improved and she is asymptomatic at this time.  She does have a few more days of antibiotics.  Past Medical History: Allergies: Allergies  Allergen Reactions   Latex  Swelling    Swelling of lips during dental procedure   Augmentin  [Amoxicillin -Pot Clavulanate] Nausea And Vomiting    Current Medications:  Current Outpatient Medications:    cephALEXin  (KEFLEX ) 500 MG capsule, Take 1 capsule (500 mg total) by mouth 4 (four) times daily for 3 days., Disp: 12 capsule, Rfl: 0   ondansetron  (ZOFRAN ) 4 MG tablet, Take 1 tablet (4 mg total) by mouth every 8 (eight) hours as needed for nausea or vomiting., Disp: 20 tablet, Rfl: 0   oxyCODONE  (OXY IR/ROXICODONE ) 5 MG immediate release tablet, Take 1 tablet (5 mg total) by mouth every 8 (eight) hours as needed for up to 3 days for severe pain (pain score 7-10)., Disp: 9 tablet, Rfl: 0   valACYclovir (VALTREX) 500 MG tablet, valacyclovir 500 mg tablet (Patient taking differently: as needed.), Disp: , Rfl:    ALPRAZolam  (XANAX ) 0.5 MG tablet, TAKE 1 TABLET BY MOUTH AT BEDTIME AS NEEDED FOR ANXIETY, Disp: 30 tablet, Rfl: 5   anastrozole  (ARIMIDEX ) 1 MG tablet, Take 1 tablet (1 mg total) by mouth daily., Disp: 90 tablet, Rfl: 3   cyanocobalamin  (VITAMIN B12) 1000 MCG tablet, Take 1,000 mcg by mouth daily., Disp: , Rfl:    denosumab (PROLIA) 60 MG/ML SOSY injection, Prolia 60 mg/mL subcutaneous syringe  Dispense 1 prefilled syringe to MD office for SQ injection Q 6 months, Disp: , Rfl:    FLUoxetine  (PROZAC ) 40 MG capsule, TAKE 1 CAPSULE (40 MG  TOTAL) BY MOUTH DAILY., Disp: 90 capsule, Rfl: 0   Multiple Vitamin (MULTIVITAMIN WITH MINERALS) TABS tablet, Take 1 tablet by mouth daily., Disp: , Rfl:    nitrofurantoin, macrocrystal-monohydrate, (MACROBID) 100 MG capsule, Take 100 mg by mouth 2 (two) times daily., Disp: , Rfl:    zolpidem  (AMBIEN ) 10 MG tablet, TAKE 1/2 TABLET BY MOUTH EVERY DAY AT BEDTIME, Disp: 45 tablet, Rfl: 1  Past Medical Problems: Past Medical History:  Diagnosis Date   Anxiety    Cancer (HCC) 02/2024   right breast IDC with DCIS   History of chicken pox    Insomnia    Restless leg syndrome    UTI  (urinary tract infection)     Past Surgical History: Past Surgical History:  Procedure Laterality Date   bilateral blerphoplasty      BREAST BIOPSY Right 12/20/2023   MM RT BREAST BX W LOC DEV 1ST LESION IMAGE BX SPEC STEREO GUIDE 12/20/2023 [facility]   BREAST BIOPSY  01/11/2024   MM RT RADIOACTIVE SEED LOC MAMMO GUIDE 01/11/2024 [facility]   BREAST LUMPECTOMY Right 02/15/2024   Procedure: BREAST LUMPECTOMY;  Surgeon: Curvin Deward MOULD, MD;  Location: [facility];  Service: General;  Laterality: Right;  RIGHT CENTRAL LUMPECTOMY   BREAST LUMPECTOMY WITH RADIOACTIVE SEED LOCALIZATION Right 01/12/2024   Procedure: BREAST LUMPECTOMY WITH RADIOACTIVE SEED LOCALIZATION;  Surgeon: Curvin Deward MOULD, MD;  Location: [facility];  Service: General;  Laterality: Right;   cataract surgery      OPEN REDUCTION INTERNAL FIXATION (ORIF) DISTAL RADIAL FRACTURE Left 03/03/2017   Procedure: OPEN REDUCTION INTERNAL FIXATION (ORIF) LEFT DISTAL RADIUS FRACTURE;  Surgeon: Vernetta Lonni GRADE, MD;  Location: [facility];  Service: Orthopedics;  Laterality: Left;   WISDOM TOOTH EXTRACTION      Social History: Social History   Socioeconomic History   Marital status: Single    Spouse name: Not on file   Number of children: Not on file   Years of education: Not on file   Highest education level: Not on file  Occupational History   Not on file  Tobacco Use   Smoking status: Never   Smokeless tobacco: Never  Vaping Use   Vaping status: Never Used  Substance and Sexual Activity   Alcohol use: Yes    Comment: wine every evening   Drug use: No   Sexual activity: Not Currently    Birth control/protection: Post-menopausal  Other Topics Concern   Not on file  Social History Narrative   Not on file   Social Drivers of Health   Financial Resource Strain: Low Risk  (08/28/2023)   Overall Financial Resource Strain (CARDIA)    Difficulty of Paying Living Expenses: Not hard at  all  Food Insecurity: No Food Insecurity (01/30/2024)   Hunger Vital Sign    Worried About Running Out of Food in the Last Year: Never true    Ran Out of Food in the Last Year: Never true  Transportation Needs: No Transportation Needs (01/30/2024)   PRAPARE - Administrator, Civil Service (Medical): No    Lack of Transportation (Non-Medical): No  Physical Activity: Inactive (08/28/2023)   Exercise Vital Sign    Days of Exercise per Week: 0 days    Minutes of Exercise per Session: 0 min  Stress: No Stress Concern Present (08/28/2023)   [employer] of Occupational Health - Occupational Stress Questionnaire    Feeling of Stress : Only a little  Social Connections: Socially Isolated (08/28/2023)   Social Connection and Isolation Panel    Frequency of Communication with Friends and Family: More than three times a week    Frequency of Social Gatherings with Friends and Family: More than three times a week    Attends Religious Services: Never    Database administrator or Organizations: No    Attends Banker Meetings: Never    Marital Status: Never married  Intimate Partner Violence: Not At Risk (08/28/2023)   Humiliation, Afraid, Rape, and Kick questionnaire    Fear of Current or Ex-Partner: No    Emotionally Abused: No    Physically Abused: No    Sexually Abused: No    Family History: No family history on file.  Review of Systems: Review of Systems  Constitutional: Negative.   Respiratory: Negative.    Cardiovascular: Negative.   Gastrointestinal: Negative.   Genitourinary: Negative.   Neurological: Negative.     Physical Exam: Vital Signs BP 119/78 (BP Location: Left Arm, Patient Position: Sitting, Cuff Size: Small)   Pulse 93   Ht 5' 6 (1.676 m)   Wt 119 lb (54 kg)   LMP  (LMP Unknown)   SpO2 95%   BMI 19.21 kg/m   Physical Exam Constitutional:      General: Not in acute distress.    Appearance: Normal appearance. Not ill-appearing.   HENT:     Head: Normocephalic and atraumatic.  Eyes:     Pupils: Pupils are equal, round Neck:     Musculoskeletal: Normal range of motion.  Cardiovascular:     Rate and Rhythm: Normal rate    Pulses: Normal pulses.  Pulmonary:     Effort: Pulmonary effort is normal. No respiratory distress.  Abdominal:     General: Abdomen is flat. There is no distension.  Musculoskeletal: Normal range of motion.  Skin:    General: Skin is warm and dry.     Findings: No erythema or rash.  Neurological:     General: No focal deficit present.     Mental Status: Alert and oriented to person, place, and time. Mental status is at baseline.     Motor: No weakness.  Psychiatric:        Mood and Affect: Mood normal.        Behavior: Behavior normal.    Assessment/Plan: The patient is scheduled for excision of excess right breast tissue, left breast mastopexy with Dr. Lowery.  Risks, benefits, and alternatives of procedure discussed, questions answered and consent obtained.    Smoking Status: Non-smoker; Counseling Given?  N/A Last Mammogram: Patient reports she has had a bilateral breast mammogram within the last year, reports results were negative on the left side.  Caprini Score: 6; Risk Factors include: Age, length of surgery intraductal carcinoma with high-grade DCIS. Recommendation for mechanical  prophylaxis. Encourage early ambulation.   Pictures obtained: @consult   Post-op Rx sent to pharmacy: Oxycodone , Zofran , Keflex   Patient was provided with the General Surgical Risk consent document and Pain Medication Agreement prior to their appointment.  They had adequate time to read through the risk consent documents and Pain Medication Agreement. We also discussed them in person together during this preop appointment. All of their questions were answered to their satisfaction.  Recommended calling if they have any further questions.  Risk consent form and Pain Medication Agreement to be scanned  into patient's chart.  The risk that can be encountered with mastopexy/breast lift were discussed and include  the following but not limited to these:  Breast asymmetry, fluid accumulation, firmness of the breast, inability to breast feed, loss of nipple or areola, skin loss, decrease or no nipple sensation, fat necrosis of the breast tissue, bleeding, infection, healing delay.  There are risks of anesthesia, changes to skin sensation and injury to nerves or blood vessels.  The muscle can be temporarily or permanently injured.  You may have an allergic reaction to tape, suture, glue, blood products which can result in skin discoloration, swelling, pain, skin lesions, poor healing.  Any of these can lead to the need for revisonal surgery or stage procedures.  A mastopexy has potential to interfere with diagnostic procedures.  Nipple or breast piercing can increase risks of infection.  This procedure is best done when the breast is fully developed.  Changes in the breast will continue to occur over time.  Pregnancy can alter the outcomes of previous breast surgery, weight gain and weigh loss can also effect the long term appearance.     Electronically signed by: Donnice PARAS Alette Kataoka, PA-C 04/09/2024 1:48 PM

## 2024-04-09 NOTE — H&P (View-Only) (Signed)
 Patient ID: Misty Bullock, female    DOB: 31-Dec-1955, 68 y.o.   MRN: [id number]  Chief Complaint  Patient presents with   Pre-op Exam      ICD-10-CM   1. Malignant neoplasm of areola of right breast in female, estrogen receptor positive (HCC)  C50.011    Z17.0     2. Ductal carcinoma in situ (DCIS) of right breast  D05.11       History of Present Illness: Misty Bullock is a 68 y.o.  female  with a history of right breast partial mastectomy.  He presents for preoperative evaluation for upcoming procedure, excision excess tissue of right breast, left breast mastopexy, scheduled for 04/17/2024 with Dr. Lowery.  The patient has not had problems with anesthesia. No history of DVT/PE.  No family history of DVT/PE.  No family or personal history of bleeding or clotting disorders.  Patient is not currently taking any blood thinners.  No history of CVA/MI.   Denies any history of Crohn's or ulcerative colitis.  She has tolerated surgery well in the past without any issues.  Summary of Previous Visit: The patient is a 68 year old female here for evaluation of her breast. The patient had right breast cancer and underwent a partial mastectomy. She is on medication but will not be getting radiation. She has significant asymmetry and a square shape to the right breast. She is hoping for some better symmetry   PMH Significant for: osteoporosis, neuropathy (peripheral), currently on anastrazole, previous right lumpectomy 01/12/2024. SCC  She has opted out of radiation therapy to right breast.  Recent CBC normal. CMP overall normal, slightly elevated AST at 43. A1c 5.4  She reports she is overall been feeling well lately, does report that she had a UTI recently, is currently on antibiotics for this.  She reports symptoms have improved and she is asymptomatic at this time.  She does have a few more days of antibiotics.  Past Medical History: Allergies: Allergies  Allergen Reactions   Latex  Swelling    Swelling of lips during dental procedure   Augmentin  [Amoxicillin -Pot Clavulanate] Nausea And Vomiting    Current Medications:  Current Outpatient Medications:    cephALEXin  (KEFLEX ) 500 MG capsule, Take 1 capsule (500 mg total) by mouth 4 (four) times daily for 3 days., Disp: 12 capsule, Rfl: 0   ondansetron  (ZOFRAN ) 4 MG tablet, Take 1 tablet (4 mg total) by mouth every 8 (eight) hours as needed for nausea or vomiting., Disp: 20 tablet, Rfl: 0   oxyCODONE  (OXY IR/ROXICODONE ) 5 MG immediate release tablet, Take 1 tablet (5 mg total) by mouth every 8 (eight) hours as needed for up to 3 days for severe pain (pain score 7-10)., Disp: 9 tablet, Rfl: 0   valACYclovir (VALTREX) 500 MG tablet, valacyclovir 500 mg tablet (Patient taking differently: as needed.), Disp: , Rfl:    ALPRAZolam  (XANAX ) 0.5 MG tablet, TAKE 1 TABLET BY MOUTH AT BEDTIME AS NEEDED FOR ANXIETY, Disp: 30 tablet, Rfl: 5   anastrozole  (ARIMIDEX ) 1 MG tablet, Take 1 tablet (1 mg total) by mouth daily., Disp: 90 tablet, Rfl: 3   cyanocobalamin  (VITAMIN B12) 1000 MCG tablet, Take 1,000 mcg by mouth daily., Disp: , Rfl:    denosumab (PROLIA) 60 MG/ML SOSY injection, Prolia 60 mg/mL subcutaneous syringe  Dispense 1 prefilled syringe to MD office for SQ injection Q 6 months, Disp: , Rfl:    FLUoxetine  (PROZAC ) 40 MG capsule, TAKE 1 CAPSULE (40 MG  TOTAL) BY MOUTH DAILY., Disp: 90 capsule, Rfl: 0   Multiple Vitamin (MULTIVITAMIN WITH MINERALS) TABS tablet, Take 1 tablet by mouth daily., Disp: , Rfl:    nitrofurantoin, macrocrystal-monohydrate, (MACROBID) 100 MG capsule, Take 100 mg by mouth 2 (two) times daily., Disp: , Rfl:    zolpidem  (AMBIEN ) 10 MG tablet, TAKE 1/2 TABLET BY MOUTH EVERY DAY AT BEDTIME, Disp: 45 tablet, Rfl: 1  Past Medical Problems: Past Medical History:  Diagnosis Date   Anxiety    Cancer (HCC) 02/2024   right breast IDC with DCIS   History of chicken pox    Insomnia    Restless leg syndrome    UTI  (urinary tract infection)     Past Surgical History: Past Surgical History:  Procedure Laterality Date   bilateral blerphoplasty      BREAST BIOPSY Right 12/20/2023   MM RT BREAST BX W LOC DEV 1ST LESION IMAGE BX SPEC STEREO GUIDE 12/20/2023 [facility]   BREAST BIOPSY  01/11/2024   MM RT RADIOACTIVE SEED LOC MAMMO GUIDE 01/11/2024 [facility]   BREAST LUMPECTOMY Right 02/15/2024   Procedure: BREAST LUMPECTOMY;  Surgeon: Curvin Deward MOULD, MD;  Location: [facility];  Service: General;  Laterality: Right;  RIGHT CENTRAL LUMPECTOMY   BREAST LUMPECTOMY WITH RADIOACTIVE SEED LOCALIZATION Right 01/12/2024   Procedure: BREAST LUMPECTOMY WITH RADIOACTIVE SEED LOCALIZATION;  Surgeon: Curvin Deward MOULD, MD;  Location: [facility];  Service: General;  Laterality: Right;   cataract surgery      OPEN REDUCTION INTERNAL FIXATION (ORIF) DISTAL RADIAL FRACTURE Left 03/03/2017   Procedure: OPEN REDUCTION INTERNAL FIXATION (ORIF) LEFT DISTAL RADIUS FRACTURE;  Surgeon: Vernetta Lonni GRADE, MD;  Location: [facility];  Service: Orthopedics;  Laterality: Left;   WISDOM TOOTH EXTRACTION      Social History: Social History   Socioeconomic History   Marital status: Single    Spouse name: Not on file   Number of children: Not on file   Years of education: Not on file   Highest education level: Not on file  Occupational History   Not on file  Tobacco Use   Smoking status: Never   Smokeless tobacco: Never  Vaping Use   Vaping status: Never Used  Substance and Sexual Activity   Alcohol use: Yes    Comment: wine every evening   Drug use: No   Sexual activity: Not Currently    Birth control/protection: Post-menopausal  Other Topics Concern   Not on file  Social History Narrative   Not on file   Social Drivers of Health   Financial Resource Strain: Low Risk  (08/28/2023)   Overall Financial Resource Strain (CARDIA)    Difficulty of Paying Living Expenses: Not hard at  all  Food Insecurity: No Food Insecurity (01/30/2024)   Hunger Vital Sign    Worried About Running Out of Food in the Last Year: Never true    Ran Out of Food in the Last Year: Never true  Transportation Needs: No Transportation Needs (01/30/2024)   PRAPARE - Administrator, Civil Service (Medical): No    Lack of Transportation (Non-Medical): No  Physical Activity: Inactive (08/28/2023)   Exercise Vital Sign    Days of Exercise per Week: 0 days    Minutes of Exercise per Session: 0 min  Stress: No Stress Concern Present (08/28/2023)   [employer] of Occupational Health - Occupational Stress Questionnaire    Feeling of Stress : Only a little  Social Connections: Socially Isolated (08/28/2023)   Social Connection and Isolation Panel    Frequency of Communication with Friends and Family: More than three times a week    Frequency of Social Gatherings with Friends and Family: More than three times a week    Attends Religious Services: Never    Database administrator or Organizations: No    Attends Banker Meetings: Never    Marital Status: Never married  Intimate Partner Violence: Not At Risk (08/28/2023)   Humiliation, Afraid, Rape, and Kick questionnaire    Fear of Current or Ex-Partner: No    Emotionally Abused: No    Physically Abused: No    Sexually Abused: No    Family History: No family history on file.  Review of Systems: Review of Systems  Constitutional: Negative.   Respiratory: Negative.    Cardiovascular: Negative.   Gastrointestinal: Negative.   Genitourinary: Negative.   Neurological: Negative.     Physical Exam: Vital Signs BP 119/78 (BP Location: Left Arm, Patient Position: Sitting, Cuff Size: Small)   Pulse 93   Ht 5' 6 (1.676 m)   Wt 119 lb (54 kg)   LMP  (LMP Unknown)   SpO2 95%   BMI 19.21 kg/m   Physical Exam Constitutional:      General: Not in acute distress.    Appearance: Normal appearance. Not ill-appearing.   HENT:     Head: Normocephalic and atraumatic.  Eyes:     Pupils: Pupils are equal, round Neck:     Musculoskeletal: Normal range of motion.  Cardiovascular:     Rate and Rhythm: Normal rate    Pulses: Normal pulses.  Pulmonary:     Effort: Pulmonary effort is normal. No respiratory distress.  Abdominal:     General: Abdomen is flat. There is no distension.  Musculoskeletal: Normal range of motion.  Skin:    General: Skin is warm and dry.     Findings: No erythema or rash.  Neurological:     General: No focal deficit present.     Mental Status: Alert and oriented to person, place, and time. Mental status is at baseline.     Motor: No weakness.  Psychiatric:        Mood and Affect: Mood normal.        Behavior: Behavior normal.    Assessment/Plan: The patient is scheduled for excision of excess right breast tissue, left breast mastopexy with Dr. Lowery.  Risks, benefits, and alternatives of procedure discussed, questions answered and consent obtained.    Smoking Status: Non-smoker; Counseling Given?  N/A Last Mammogram: Patient reports she has had a bilateral breast mammogram within the last year, reports results were negative on the left side.  Caprini Score: 6; Risk Factors include: Age, length of surgery intraductal carcinoma with high-grade DCIS. Recommendation for mechanical  prophylaxis. Encourage early ambulation.   Pictures obtained: @consult   Post-op Rx sent to pharmacy: Oxycodone , Zofran , Keflex   Patient was provided with the General Surgical Risk consent document and Pain Medication Agreement prior to their appointment.  They had adequate time to read through the risk consent documents and Pain Medication Agreement. We also discussed them in person together during this preop appointment. All of their questions were answered to their satisfaction.  Recommended calling if they have any further questions.  Risk consent form and Pain Medication Agreement to be scanned  into patient's chart.  The risk that can be encountered with mastopexy/breast lift were discussed and include  the following but not limited to these:  Breast asymmetry, fluid accumulation, firmness of the breast, inability to breast feed, loss of nipple or areola, skin loss, decrease or no nipple sensation, fat necrosis of the breast tissue, bleeding, infection, healing delay.  There are risks of anesthesia, changes to skin sensation and injury to nerves or blood vessels.  The muscle can be temporarily or permanently injured.  You may have an allergic reaction to tape, suture, glue, blood products which can result in skin discoloration, swelling, pain, skin lesions, poor healing.  Any of these can lead to the need for revisonal surgery or stage procedures.  A mastopexy has potential to interfere with diagnostic procedures.  Nipple or breast piercing can increase risks of infection.  This procedure is best done when the breast is fully developed.  Changes in the breast will continue to occur over time.  Pregnancy can alter the outcomes of previous breast surgery, weight gain and weigh loss can also effect the long term appearance.     Electronically signed by: Misty PARAS Alette Kataoka, PA-C 04/09/2024 1:48 PM

## 2024-04-10 ENCOUNTER — Telehealth: Payer: Self-pay | Admitting: *Deleted

## 2024-04-10 MED ORDER — FOLIC ACID 1 MG PO TABS: 1.0000 mg | ORAL_TABLET | Freq: Every day | ORAL | 3 refills | Status: AC

## 2024-04-10 NOTE — Telephone Encounter (Signed)
 Spoke to pt told her it was a typo error and it was suppose to be Folate/Folic acid  1 mg. Told pt I sent new prescription to the pharmacy. Pt verbalized understanding.

## 2024-04-10 NOTE — Telephone Encounter (Signed)
 Spoke to pt told her calling about medications. Folate is 5 mcg and Vit B12 is 1000 mcg they are both OTC. Pt verbalized understanding and said she got the Vit B12 but can not fine Folate. Told her I will check with Dr. Kennyth and get back to you. Pt verbalized understanding.

## 2024-04-10 NOTE — Telephone Encounter (Signed)
 Copied from CRM [id number]. Topic: Clinical - Medication Question >> Apr 08, 2024  3:29 PM Zenovia PARAS wrote: Reason for CRM: Patient called to get  mcg clarification on medication she ordered. Would like a call to discuss  See other message.

## 2024-04-10 NOTE — Telephone Encounter (Signed)
 Discussed medication Folate with Dr. Kennyth told him pt is having trouble finding Folate 5 mcg need clarification for dose. Dr. Kennyth said that was a typo error, gave verbal order for Folate 1 mg daily.

## 2024-04-10 NOTE — Telephone Encounter (Signed)
 Copied from CRM [id number]. Topic: Clinical - Medication Question >> Apr 09, 2024  2:34 PM Suzen RAMAN wrote: Reason for CRM: Patient would like a call back to discuss medication methylfolate medication was written in [facility] and patient not sure how to convert it to MG. Per CAL and E2C2 agent medication not located on patient med list. Please contact patient to further advise

## 2024-04-16 ENCOUNTER — Other Ambulatory Visit

## 2024-04-17 ENCOUNTER — Ambulatory Visit (HOSPITAL_BASED_OUTPATIENT_CLINIC_OR_DEPARTMENT_OTHER)
Admission: RE | Admit: 2024-04-17 | Discharge: 2024-04-17 | Disposition: A | Discharge status: Home / Self Care | Attending: Plastic Surgery | Admitting: Plastic Surgery

## 2024-04-17 ENCOUNTER — Ambulatory Visit (HOSPITAL_BASED_OUTPATIENT_CLINIC_OR_DEPARTMENT_OTHER): Admitting: Anesthesiology

## 2024-04-17 ENCOUNTER — Encounter (HOSPITAL_BASED_OUTPATIENT_CLINIC_OR_DEPARTMENT_OTHER): Payer: Self-pay | Admitting: Plastic Surgery

## 2024-04-17 ENCOUNTER — Encounter (HOSPITAL_BASED_OUTPATIENT_CLINIC_OR_DEPARTMENT_OTHER)
Admission: RE | Disposition: A | Discharge status: Home / Self Care | Payer: Self-pay | Source: Home / Self Care | Attending: Plastic Surgery

## 2024-04-17 DIAGNOSIS — F419 Anxiety disorder, unspecified: Secondary | ICD-10-CM | POA: Diagnosis not present

## 2024-04-17 DIAGNOSIS — G2581 Restless legs syndrome: Secondary | ICD-10-CM | POA: Insufficient documentation

## 2024-04-17 DIAGNOSIS — M810 Age-related osteoporosis without current pathological fracture: Secondary | ICD-10-CM | POA: Insufficient documentation

## 2024-04-17 DIAGNOSIS — N6489 Other specified disorders of breast: Secondary | ICD-10-CM | POA: Insufficient documentation

## 2024-04-17 DIAGNOSIS — N651 Disproportion of reconstructed breast: Secondary | ICD-10-CM

## 2024-04-17 DIAGNOSIS — Z9011 Acquired absence of right breast and nipple: Secondary | ICD-10-CM | POA: Insufficient documentation

## 2024-04-17 DIAGNOSIS — C50919 Malignant neoplasm of unspecified site of unspecified female breast: Secondary | ICD-10-CM | POA: Diagnosis not present

## 2024-04-17 DIAGNOSIS — Z01818 Encounter for other preprocedural examination: Secondary | ICD-10-CM

## 2024-04-17 DIAGNOSIS — C50011 Malignant neoplasm of nipple and areola, right female breast: Secondary | ICD-10-CM | POA: Diagnosis not present

## 2024-04-17 DIAGNOSIS — Z853 Personal history of malignant neoplasm of breast: Secondary | ICD-10-CM

## 2024-04-17 DIAGNOSIS — Z170 Estrogen receptor positive status [ER+]: Secondary | ICD-10-CM | POA: Diagnosis not present

## 2024-04-17 HISTORY — PX: REVISION, RECONSTRUCTION, BREAST: SHX7641

## 2024-04-17 HISTORY — PX: MASTOPEXY: SHX5358

## 2024-04-17 HISTORY — DX: Urinary tract infection, site not specified: N39.0

## 2024-04-17 SURGERY — REVISION, RECONSTRUCTION, BREAST
Anesthesia: General | Site: Breast | Laterality: Right

## 2024-04-17 MED ORDER — SODIUM CHLORIDE 0.9% FLUSH: 3.0000 mL | INTRAVENOUS | Status: DC | PRN

## 2024-04-17 MED ORDER — DROPERIDOL 2.5 MG/ML IJ SOLN: 0.6250 mg | Freq: Once | INTRAMUSCULAR | Status: DC | PRN

## 2024-04-17 MED ORDER — SODIUM CHLORIDE 0.9 % IV SOLN: 250.0000 mL | INTRAVENOUS | Status: DC | PRN

## 2024-04-17 MED ORDER — LACTATED RINGERS IV SOLN: INTRAVENOUS | Status: DC | PRN

## 2024-04-17 MED ORDER — FENTANYL CITRATE (PF) 100 MCG/2ML IJ SOLN
INTRAMUSCULAR | Status: AC
  Filled 2024-04-17: qty 2

## 2024-04-17 MED ORDER — DEXAMETHASONE SODIUM PHOSPHATE 10 MG/ML IJ SOLN
INTRAMUSCULAR | Status: DC | PRN
  Administered 2024-04-17: 10 mg via INTRAVENOUS

## 2024-04-17 MED ORDER — VASHE WOUND IRRIGATION OPTIME
TOPICAL | Status: DC | PRN
  Administered 2024-04-17: 34 [oz_av]

## 2024-04-17 MED ORDER — ONDANSETRON HCL 4 MG/2ML IJ SOLN
INTRAMUSCULAR | Status: DC | PRN
Start: 2024-04-17 — End: 2024-04-17
  Administered 2024-04-17: 4 mg via INTRAVENOUS

## 2024-04-17 MED ORDER — ONDANSETRON HCL 4 MG/2ML IJ SOLN: 4.0000 mg | Freq: Once | INTRAMUSCULAR | Status: DC | PRN

## 2024-04-17 MED ORDER — FENTANYL CITRATE (PF) 250 MCG/5ML IJ SOLN
INTRAMUSCULAR | Status: DC | PRN
  Administered 2024-04-17: 100 ug via INTRAVENOUS

## 2024-04-17 MED ORDER — ACETAMINOPHEN 325 MG RE SUPP: 650.0000 mg | RECTAL | Status: DC | PRN

## 2024-04-17 MED ORDER — CHLORHEXIDINE GLUCONATE CLOTH 2 % EX PADS: 6.0000 | MEDICATED_PAD | Freq: Once | CUTANEOUS | Status: DC

## 2024-04-17 MED ORDER — OXYCODONE HCL 5 MG PO TABS: 5.0000 mg | ORAL_TABLET | Freq: Once | ORAL | Status: DC | PRN

## 2024-04-17 MED ORDER — SUGAMMADEX SODIUM 200 MG/2ML IV SOLN
INTRAVENOUS | Status: DC | PRN
  Administered 2024-04-17: 200 mg via INTRAVENOUS

## 2024-04-17 MED ORDER — MIDAZOLAM HCL 2 MG/2ML IJ SOLN
INTRAMUSCULAR | Status: AC
  Filled 2024-04-17: qty 2

## 2024-04-17 MED ORDER — CIPROFLOXACIN IN D5W 400 MG/200ML IV SOLN
400.0000 mg | INTRAVENOUS | Status: AC
  Administered 2024-04-17: 400 mg via INTRAVENOUS

## 2024-04-17 MED ORDER — CIPROFLOXACIN IN D5W 400 MG/200ML IV SOLN
INTRAVENOUS | Status: AC
  Filled 2024-04-17: qty 200

## 2024-04-17 MED ORDER — PROPOFOL 10 MG/ML IV BOLUS
INTRAVENOUS | Status: DC | PRN
Start: 2024-04-17 — End: 2024-04-17
  Administered 2024-04-17: 150 mg via INTRAVENOUS

## 2024-04-17 MED ORDER — LIDOCAINE 2% (20 MG/ML) 5 ML SYRINGE
INTRAMUSCULAR | Status: DC | PRN
  Administered 2024-04-17: 80 mg via INTRAVENOUS

## 2024-04-17 MED ORDER — FENTANYL CITRATE (PF) 100 MCG/2ML IJ SOLN: 25.0000 ug | INTRAMUSCULAR | Status: DC | PRN

## 2024-04-17 MED ORDER — SODIUM CHLORIDE 0.9 % IV SOLN
INTRAVENOUS | Status: DC | PRN
  Administered 2024-04-17: 20 mL

## 2024-04-17 MED ORDER — MIDAZOLAM HCL 2 MG/2ML IJ SOLN
INTRAMUSCULAR | Status: DC | PRN
  Administered 2024-04-17: 2 mg via INTRAVENOUS

## 2024-04-17 MED ORDER — ROCURONIUM BROMIDE 10 MG/ML (PF) SYRINGE
PREFILLED_SYRINGE | INTRAVENOUS | Status: DC | PRN
  Administered 2024-04-17: 50 mg via INTRAVENOUS

## 2024-04-17 MED ORDER — SODIUM CHLORIDE 0.9% FLUSH: 3.0000 mL | Freq: Two times a day (BID) | INTRAVENOUS | Status: DC

## 2024-04-17 MED ORDER — ACETAMINOPHEN 500 MG PO TABS
ORAL_TABLET | ORAL | Status: AC
  Filled 2024-04-17: qty 2

## 2024-04-17 MED ORDER — OXYCODONE HCL 5 MG PO TABS: 5.0000 mg | ORAL_TABLET | ORAL | Status: DC | PRN

## 2024-04-17 MED ORDER — HYDROMORPHONE HCL 1 MG/ML IJ SOLN
INTRAMUSCULAR | Status: AC
Start: 2024-04-17 — End: 2024-04-17
  Filled 2024-04-17: qty 0.5

## 2024-04-17 MED ORDER — OXYCODONE HCL 5 MG/5ML PO SOLN: 5.0000 mg | Freq: Once | ORAL | Status: DC | PRN

## 2024-04-17 MED ORDER — LACTATED RINGERS IV SOLN: INTRAVENOUS | Status: DC

## 2024-04-17 MED ORDER — HYDROMORPHONE HCL 1 MG/ML IJ SOLN: 0.2500 mg | INTRAMUSCULAR | Status: DC | PRN

## 2024-04-17 MED ORDER — ACETAMINOPHEN 325 MG PO TABS: 650.0000 mg | ORAL_TABLET | ORAL | Status: DC | PRN

## 2024-04-17 MED ORDER — HYDROMORPHONE HCL 1 MG/ML IJ SOLN
INTRAMUSCULAR | Status: DC | PRN
  Administered 2024-04-17: .5 mg via INTRAVENOUS

## 2024-04-17 MED ORDER — LIDOCAINE-EPINEPHRINE 1 %-1:100000 IJ SOLN
INTRAMUSCULAR | Status: DC | PRN
  Administered 2024-04-17: 20 mL

## 2024-04-17 SURGICAL SUPPLY — 58 items
BAG DECANTER FOR FLEXI CONT (MISCELLANEOUS) ×2 IMPLANT
BINDER BREAST LRG (GAUZE/BANDAGES/DRESSINGS) IMPLANT
BINDER BREAST MEDIUM (GAUZE/BANDAGES/DRESSINGS) IMPLANT
BINDER BREAST XLRG (GAUZE/BANDAGES/DRESSINGS) IMPLANT
BINDER BREAST XXLRG (GAUZE/BANDAGES/DRESSINGS) IMPLANT
BIOPATCH RED 1 DISK 7.0 (GAUZE/BANDAGES/DRESSINGS) IMPLANT
BLADE HEX COATED 2.75 (ELECTRODE) ×2 IMPLANT
BLADE SURG 10 STRL SS (BLADE) ×2 IMPLANT
BLADE SURG 15 STRL LF DISP TIS (BLADE) ×2 IMPLANT
BNDG GAUZE DERMACEA FLUFF 4 (GAUZE/BANDAGES/DRESSINGS) ×4 IMPLANT
CANISTER SUCT 1200ML W/VALVE (MISCELLANEOUS) ×2 IMPLANT
COVER BACK TABLE 60X90IN (DRAPES) ×2 IMPLANT
COVER MAYO STAND STRL (DRAPES) ×2 IMPLANT
DERMABOND ADVANCED .7 DNX12 (GAUZE/BANDAGES/DRESSINGS) IMPLANT
DRAIN CHANNEL 19F RND (DRAIN) IMPLANT
DRAPE LAPAROSCOPIC ABDOMINAL (DRAPES) ×2 IMPLANT
DRESSING MEPILEX FLEX 4X4 (GAUZE/BANDAGES/DRESSINGS) IMPLANT
DRSG MEPILEX POST OP 4X8 (GAUZE/BANDAGES/DRESSINGS) IMPLANT
DRSG TEGADERM 2-3/8X2-3/4 SM (GAUZE/BANDAGES/DRESSINGS) IMPLANT
ELECT BLADE 6.5 EXT (BLADE) IMPLANT
ELECTRODE BLDE 4.0 EZ CLN MEGD (MISCELLANEOUS) ×2 IMPLANT
ELECTRODE REM PT RTRN 9FT ADLT (ELECTROSURGICAL) ×2 IMPLANT
EVACUATOR SILICONE 100CC (DRAIN) IMPLANT
FUNNEL KELLER 2 DISP (MISCELLANEOUS) IMPLANT
GAUZE PAD ABD 8X10 STRL (GAUZE/BANDAGES/DRESSINGS) ×4 IMPLANT
GAUZE SPONGE 4X4 12PLY STRL (GAUZE/BANDAGES/DRESSINGS) IMPLANT
GAUZE SPONGE 4X4 12PLY STRL LF (GAUZE/BANDAGES/DRESSINGS) IMPLANT
GLOVE BIO SURGEON STRL SZ 6.5 (GLOVE) ×4 IMPLANT
GOWN STRL REUS W/ TWL LRG LVL3 (GOWN DISPOSABLE) ×6 IMPLANT
IV NS 500ML BAXH (IV SOLUTION) ×2 IMPLANT
KIT FILL ASEPTIC TRANSFER (MISCELLANEOUS) IMPLANT
NDL HYPO 25X1 1.5 SAFETY (NEEDLE) ×2 IMPLANT
NEEDLE HYPO 25X1 1.5 SAFETY (NEEDLE) ×4 IMPLANT
NS IRRIG 1000ML POUR BTL (IV SOLUTION) ×2 IMPLANT
PACK BASIN DAY SURGERY FS (CUSTOM PROCEDURE TRAY) ×2 IMPLANT
PENCIL SMOKE EVACUATOR (MISCELLANEOUS) ×2 IMPLANT
PIN SAFETY STERILE (MISCELLANEOUS) IMPLANT
SLEEVE SCD COMPRESS KNEE MED (STOCKING) ×2 IMPLANT
SPIKE FLUID TRANSFER (MISCELLANEOUS) IMPLANT
SPONGE T-LAP 18X18 ~~LOC~~+RFID (SPONGE) ×4 IMPLANT
STRIP CLOSURE SKIN 1/2X4 (GAUZE/BANDAGES/DRESSINGS) ×2 IMPLANT
STRIP SUTURE WOUND CLOSURE 1/2 (MISCELLANEOUS) IMPLANT
SUT MNCRL AB 4-0 PS2 18 (SUTURE) IMPLANT
SUT MON AB 3-0 SH27 (SUTURE) ×2 IMPLANT
SUT MON AB 5-0 PS2 18 (SUTURE) ×2 IMPLANT
SUT PDS AB 2-0 CT2 27 (SUTURE) IMPLANT
SUT PDS II 3-0 CT2 27 ABS (SUTURE) IMPLANT
SUT SILK 3 0 PS 1 (SUTURE) IMPLANT
SUT VIC AB 3-0 SH 27X BRD (SUTURE) IMPLANT
SUT VIC AB 4-0 PS2 18 (SUTURE) IMPLANT
SYR BULB IRRIG 60ML STRL (SYRINGE) ×2 IMPLANT
SYR CONTROL 10ML LL (SYRINGE) ×2 IMPLANT
TAPE MEASURE VINYL STERILE (MISCELLANEOUS) ×2 IMPLANT
TOWEL GREEN STERILE FF (TOWEL DISPOSABLE) ×4 IMPLANT
TRAY DSU PREP LF (CUSTOM PROCEDURE TRAY) ×2 IMPLANT
TUBE CONNECTING 20X1/4 (TUBING) ×2 IMPLANT
UNDERPAD 30X36 HEAVY ABSORB (UNDERPADS AND DIAPERS) ×4 IMPLANT
YANKAUER SUCT BULB TIP NO VENT (SUCTIONS) ×2 IMPLANT

## 2024-04-17 NOTE — Anesthesia Postprocedure Evaluation (Signed)
 Anesthesia Post Note  Patient: Misty Bullock  Procedure(s) Performed: REVISION, RECONSTRUCTION, BREAST (Right: Breast) MASTOPEXY (Left: Breast)     Patient location during evaluation: PACU Anesthesia Type: General Level of consciousness: awake and alert and oriented Pain management: pain level controlled Vital Signs Assessment: post-procedure vital signs reviewed and stable Respiratory status: spontaneous breathing, nonlabored ventilation and respiratory function stable Cardiovascular status: blood pressure returned to baseline and stable Postop Assessment: no apparent nausea or vomiting Anesthetic complications: no   There were no known notable events for this encounter.  Last Vitals:  Vitals:   04/17/24 1215 04/17/24 1230  BP: (!) 143/87 (!) 147/81  Pulse: 82 78  Resp: 13 17  Temp:    SpO2: 100% 99%    Last Pain:  Vitals:   04/17/24 1230  TempSrc:   PainSc: 0-No pain                 Terril Amaro A.

## 2024-04-17 NOTE — Discharge Instructions (Addendum)
INSTRUCTIONS FOR AFTER BREAST SURGERY   You will likely have some questions about what to expect following your operation.  The following information will help you and your family understand what to expect when you are discharged from the hospital.  It is important to follow these guidelines to help ensure a smooth recovery and reduce complication.  Postoperative instructions include information on: diet, wound care, medications and physical activity.  AFTER SURGERY Expect to go home after the procedure.  In some cases, you may need to spend one night in the hospital for observation.  DIET Breast surgery does not require a specific diet.  However, the healthier you eat the better your body will heal. It is important to increasing your protein intake.  This means limiting the foods with sugar and carbohydrates.  Focus on vegetables and some meat.  If you have liposuction during your procedure be sure to drink water.  If your urine is bright yellow, then it is concentrated, and you need to drink more water.  As a general rule after surgery, you should have 8 ounces of water every hour while awake.  If you find you are persistently nauseated or unable to take in liquids let us know.  NO TOBACCO USE or EXPOSURE.  This will slow your healing process and lead to a wound.  WOUND CARE Leave the binder on for 3 days . Use fragrance free soap like Dial, Dove or [place].   After 3 days you can remove the binder to shower. Once dry apply binder or sports bra. If you have liposuction you will have a soft and spongy dressing (Lipofoam) that helps prevent creases in your skin.  Remove before you shower and then replace it.  It is also available on [employer]. If you have steri-strips / tape directly attached to your skin leave them in place. It is OK to get these wet.   No baths, pools or hot tubs for four weeks. We close your incision to leave the smallest and best-looking scar. No ointment or creams on your incisions  for four weeks.  No Neosporin (Too many skin reactions).  A few weeks after surgery you can use Mederma and start massaging the scar. We ask you to wear your binder or sports bra for the first 6 weeks around the clock, including while sleeping. This provides added comfort and helps reduce the fluid accumulation at the surgery site. NO Ice or heating pads to the operative site.  You have a very high risk of a BURN before you feel the temperature change.  ACTIVITY No heavy lifting until cleared by the doctor.  This usually means no more than a half-gallon of milk.  It is OK to walk and climb stairs. Moving your legs is very important to decrease your risk of a blood clot.  It will also help keep you from getting deconditioned.  Every 1 to 2 hours get up and walk for 5 minutes. This will help with a quicker recovery back to normal.  Let pain be your guide so you don't do too much.  This time is for you to recover.  You will be more comfortable if you sleep and rest with your head elevated either with a few pillows under you or in a recliner.  No stomach sleeping for a three months.  WORK Everyone returns to work at different times. As a rough guide, most people take at least 1 - 2 weeks off prior to returning to work. If  you need documentation for your job, give the forms to the front staff at the clinic.  DRIVING Arrange for someone to bring you home from the hospital after your surgery.  You may be able to drive a few days after surgery but not while taking any narcotics or valium.  BOWEL MOVEMENTS Constipation can occur after anesthesia and while taking pain medication.  It is important to stay ahead for your comfort.  We recommend taking Milk of Magnesia (2 tablespoons; twice a day) while taking the pain pills.  MEDICATIONS You may be prescribed should start after surgery At your preoperative visit for you history and physical you may have been given the following medications: An antibiotic: Start  this medication when you get home and take according to the instructions on the bottle. Zofran 4 mg:  This is to treat nausea and vomiting.  You can take this every 6 hours as needed and only if needed. Valium 2 mg for breast cancer patients: This is for muscle tightness if you have an implant or expander. This will help relax your muscle which also helps with pain control.  This can be taken every 12 hours as needed. Don't drive after taking this medication. Norco (hydrocodone/acetaminophen) 5/325 mg:  This is only to be used after you have taken the Motrin or the Tylenol. Every 8 hours as needed.   Over the counter Medication to take: Ibuprofen (Motrin) 600 mg:  Take this every 6 hours.  If you have additional pain then take 500 mg of the Tylenol every 8 hours.  Only take the Norco after you have tried these two. MiraLAX or Milk of Magnesia: Take this according to the bottle if you take the Norco.  WHEN TO CALL Call your surgeon's office if any of the following occur: Fever 101 degrees F or greater Excessive bleeding or fluid from the incision site. Pain that increases over time without aid from the medications Redness, warmth, or pus draining from incision sites Persistent nausea or inability to take in liquids Severe misshapen area that underwent the operation.  Post Anesthesia Home Care Instructions  Activity: Get plenty of rest for the remainder of the day. A responsible individual must stay with you for 24 hours following the procedure.  For the next 24 hours, DO NOT: -Drive a car -Advertising copywriter -Drink alcoholic beverages -Take any medication unless instructed by your physician -Make any legal decisions or sign important papers.  Meals: Start with liquid foods such as gelatin or soup. Progress to regular foods as tolerated. Avoid greasy, spicy, heavy foods. If nausea and/or vomiting occur, drink only clear liquids until the nausea and/or vomiting subsides. Call your  physician if vomiting continues.  Special Instructions/Symptoms: Your throat may feel dry or sore from the anesthesia or the breathing tube placed in your throat during surgery. If this causes discomfort, gargle with warm salt water. The discomfort should disappear within 24 hours.  If you had a scopolamine patch placed behind your ear for the management of post- operative nausea and/or vomiting:  1. The medication in the patch is effective for 72 hours, after which it should be removed.  Wrap patch in a tissue and discard in the trash. Wash hands thoroughly with soap and water. 2. You may remove the patch earlier than 72 hours if you experience unpleasant side effects which may include dry mouth, dizziness or visual disturbances. 3. Avoid touching the patch. Wash your hands with soap and water after contact with the patch.  Information for Discharge Teaching: EXPAREL (bupivacaine liposome injectable suspension)   Pain relief is important to your recovery. The goal is to control your pain so you can move easier and return to your normal activities as soon as possible after your procedure. Your physician may use several types of medicines to manage pain, swelling, and more.  Your surgeon or anesthesiologist gave you EXPAREL(bupivacaine) to help control your pain after surgery.  EXPAREL is a local anesthetic designed to release slowly over an extended period of time to provide pain relief by numbing the tissue around the surgical site. EXPAREL is designed to release pain medication over time and can control pain for up to 72 hours. Depending on how you respond to EXPAREL, you may require less pain medication during your recovery. EXPAREL can help reduce or eliminate the need for opioids during the first few days after surgery when pain relief is needed the most. EXPAREL is not an opioid and is not addictive. It does not cause sleepiness or sedation.   Important! A teal colored band has been  placed on your arm with the date, time and amount of EXPAREL you have received. Please leave this armband in place for the full 96 hours following administration, and then you may remove the band. If you return to the hospital for any reason within 96 hours following the administration of EXPAREL, the armband provides important information that your health care providers to know, and alerts them that you have received this anesthetic.    Possible side effects of EXPAREL: Temporary loss of sensation or ability to move in the area where medication was injected. Nausea, vomiting, constipation Rarely, numbness and tingling in your mouth or lips, lightheadedness, or anxiety may occur. Call your doctor right away if you think you may be experiencing any of these sensations, or if you have other questions regarding possible side effects.  Follow all other discharge instructions given to you by your surgeon or nurse. Eat a healthy diet and drink plenty of water or other fluids.

## 2024-04-17 NOTE — Interval H&P Note (Signed)
 History and Physical Interval Note:  04/17/2024 10:04 AM  Misty Bullock  has presented today for surgery, with the diagnosis of breast cancer.  The various methods of treatment have been discussed with the patient and family. After consideration of risks, benefits and other options for treatment, the patient has consented to  Procedure(s) with comments: REVISION, RECONSTRUCTION, BREAST (Right) - excision excess breast tissue right breast MASTOPEXY (Left) as a surgical intervention.  The patient's history has been reviewed, patient examined, no change in status, stable for surgery.  I have reviewed the patient's chart and labs.  Questions were answered to the patient's satisfaction.     Estefana RAMAN Jaylynn Siefert

## 2024-04-17 NOTE — Anesthesia Procedure Notes (Signed)
 Procedure Name: Intubation Date/Time: 04/17/2024 11:08 AM  Performed by: Scherrie Mast, CRNAPre-anesthesia Checklist: Patient identified, Emergency Drugs available, Suction available and Patient being monitored Patient Re-evaluated:Patient Re-evaluated prior to induction Oxygen Delivery Method: Circle System Utilized Preoxygenation: Pre-oxygenation with 100% oxygen Induction Type: IV induction Ventilation: Mask ventilation without difficulty Laryngoscope Size: Mac and 4 Grade View: Grade I Tube type: Oral Tube size: 7.0 mm Number of attempts: 1 Airway Equipment and Method: Stylet and Oral airway Placement Confirmation: ETT inserted through vocal cords under direct vision, positive ETCO2 and breath sounds checked- equal and bilateral Secured at: 21 cm Tube secured with: Tape Dental Injury: Teeth and Oropharynx as per pre-operative assessment

## 2024-04-17 NOTE — Anesthesia Preprocedure Evaluation (Addendum)
 Anesthesia Evaluation  Patient identified by MRN, date of birth, ID band Patient awake    Reviewed: Allergy & Precautions, NPO status , Patient's Chart, lab work & pertinent test results  History of Anesthesia Complications Negative for: history of anesthetic complications  Airway Mallampati: II  TM Distance: >3 FB Neck ROM: Full    Dental  (+) Dental Advisory Given, Teeth Intact   Pulmonary neg pulmonary ROS   Pulmonary exam normal breath sounds clear to auscultation       Cardiovascular (-) hypertension(-) angina (-) Past MI, (-) Cardiac Stents and (-) CABG negative cardio ROS (-) dysrhythmias  Rhythm:Regular Rate:Normal  HLD   Neuro/Psych  PSYCHIATRIC DISORDERS Anxiety     Restless legs syndrome    GI/Hepatic negative GI ROS, Neg liver ROS,,,  Endo/Other  negative endocrine ROS  Malignant neoplasm of nipple and areola of female breast, right  Intraductal carcinoma in situ of right breast  S/P Lumpectomy   Renal/GU negative Renal ROS  negative genitourinary   Musculoskeletal negative musculoskeletal ROS (+)  Osteoporosis    Abdominal   Peds  Hematology negative hematology ROS (+) Lab Results      Component                Value               Date                      WBC                      4.4                 03/29/2024                HGB                      14.7                03/29/2024                HCT                      44.3                03/29/2024                MCV                      98.5                03/29/2024                PLT                      236.0               03/29/2024              Anesthesia Other Findings   Reproductive/Obstetrics negative OB ROS Right breast                              Anesthesia Physical Anesthesia Plan  ASA: 2  Anesthesia Plan: General   Post-op Pain Management: Tylenol  PO (pre-op)*, Precedex and Minimal or no pain  anticipated   Induction: Intravenous  PONV Risk Score and Plan: 4  or greater and Ondansetron , Dexamethasone , Propofol  infusion, TIVA and Treatment may vary due to age or medical condition  Airway Management Planned: LMA  Additional Equipment: None  Intra-op Plan:   Post-operative Plan: Extubation in OR  Informed Consent: I have reviewed the patients History and Physical, chart, labs and discussed the procedure including the risks, benefits and alternatives for the proposed anesthesia with the patient or authorized representative who has indicated his/her understanding and acceptance.     Dental advisory given  Plan Discussed with: CRNA and Anesthesiologist  Anesthesia Plan Comments:          Anesthesia Quick Evaluation

## 2024-04-17 NOTE — Transfer of Care (Signed)
 Immediate Anesthesia Transfer of Care Note  Patient: Latona Krichbaum Lyttle  Procedure(s) Performed: REVISION, RECONSTRUCTION, BREAST (Right: Breast) MASTOPEXY (Left: Breast)  Patient Location: PACU  Anesthesia Type:General  Level of Consciousness: awake, alert , and oriented  Airway & Oxygen Therapy: Patient Spontanous Breathing and Patient connected to nasal cannula oxygen  Post-op Assessment: Report given to RN and Post -op Vital signs reviewed and stable  Post vital signs: Reviewed and stable  Last Vitals:  Vitals Value Taken Time  BP 143/87 04/17/24 12:15  Temp 36.3 C 04/17/24 12:11  Pulse 80 04/17/24 12:15  Resp 23 04/17/24 12:16  SpO2 96 % 04/17/24 12:15  Vitals shown include unfiled device data.  Last Pain:  Vitals:   04/17/24 1005  TempSrc: Temporal  PainSc: 0-No pain      Patients Stated Pain Goal: 3 (04/17/24 1005)  Complications: No notable events documented.

## 2024-04-17 NOTE — Op Note (Signed)
 DATE OF OPERATION: 04/17/2024  LOCATION: [facility]  PREOPERATIVE DIAGNOSIS: Breast asymmetry after breast cancer treatment  POSTOPERATIVE DIAGNOSIS: Same  PROCEDURE:  Right breast excision of excess breast tissue 4 x 4 cm Left breast mastopexy  SURGEON: Estefana Fritter, DO  ASSISTANT: [name], PA  EBL: none  CONDITION: Stable  COMPLICATIONS: None  INDICATION: The patient, Misty Bullock, is a 68 y.o. female born on 1956-07-17, is here for treatment of breast asymmetry after breast cancer.   PROCEDURE DETAILS:  The patient was seen prior to surgery and marked.  The IV antibiotics were given. The patient was taken to the operating room and given a general anesthetic. A standard time out was performed and all information was confirmed by those in the room. SCDs were placed.   The chest was prepped and draped.    Right: Local with epinephrine  was placed in the right breast area.  The lateral incision of the breast partial mastectomy was marked.  The excess 2 x 2 cm of skin and soft tissue was excised.  Hemostasis was achieved with electrocautery.  The deep layer was closed with the 3-0 Monocryl.  The 4-0 Monocryl was used to close the skin. The medial aspect of the mastectomy incision was excised for an area of 2 x 2 cm.   Hemostasis was achieved with electrocautery.  The deep layer was closed with the 3-0 Monocryl.  The 4-0 Monocryl was used to close the skin. The 4 mm cannula was used to do do a small amount of liposuction to round the breast. Experel was placed in the right breast deep tissue.   Left breast:  Preoperative markings were confirmed.  Incision lines were injected with local containing epinephrine .  After waiting for vasoconstriction, the marked lines were incised with a #15 blade.  A Wise-pattern superomedial breast reduction was performed by de-epithelializing the pedicle, using bovie to create the superomedial pedicle, and removing breast tissue from  the lateral and inferior portions of the breast.  Care was taken to not undermine the breast pedicle. Hemostasis was achieved.  The nipple was gently lifted into position and the soft tissue was closed with 3-0 and 4-0 Monocryl.  The patient was sat upright and size and shape symmetry was confirmed.  The deep tissues were approximated with 3-0 PDS sutures. The skin was closed with deep dermal 3-0 Monocryl and subcuticular 4-0 Monocryl sutures.  Experel was placed in the deep layers. Dermabond was applied.  A breast binder and ABDs were placed.  The nipple and skin flaps had good capillary refill at the end of the procedure.  The patient tolerated the procedure well. The patient was allowed to wake from anesthesia and taken to the recovery room in satisfactory condition.  The advanced practice practitioner (APP) assisted throughout the case.  The APP was essential in retraction and counter traction when needed to make the case progress smoothly.  This retraction and assistance made it possible to see the tissue plans for the procedure.  The assistance was needed for blood control, tissue re-approximation and assisted with closure of the incision site.

## 2024-04-18 ENCOUNTER — Encounter (HOSPITAL_BASED_OUTPATIENT_CLINIC_OR_DEPARTMENT_OTHER): Payer: Self-pay | Admitting: Plastic Surgery

## 2024-04-19 LAB — SURGICAL PATHOLOGY

## 2024-04-23 ENCOUNTER — Other Ambulatory Visit (INDEPENDENT_AMBULATORY_CARE_PROVIDER_SITE_OTHER)

## 2024-04-23 DIAGNOSIS — R748 Abnormal levels of other serum enzymes: Secondary | ICD-10-CM

## 2024-04-23 LAB — COMPREHENSIVE METABOLIC PANEL WITH GFR
ALT: 23 U/L (ref 0–35)
AST: 36 U/L (ref 0–37)
Albumin: 4.2 g/dL (ref 3.5–5.2)
Alkaline Phosphatase: 43 U/L (ref 39–117)
BUN: 11 mg/dL (ref 6–23)
CO2: 29 meq/L (ref 19–32)
Calcium: 10 mg/dL (ref 8.4–10.5)
Chloride: 94 meq/L — ABNORMAL LOW (ref 96–112)
Creatinine, Ser: 0.68 mg/dL (ref 0.40–1.20)
GFR: 88.54 mL/min (ref >60.00)
Glucose, Bld: 104 mg/dL — ABNORMAL HIGH (ref 70–99)
Potassium: 4.2 meq/L (ref 3.5–5.1)
Sodium: 131 meq/L — ABNORMAL LOW (ref 135–145)
Total Bilirubin: 0.6 mg/dL (ref 0.2–1.2)
Total Protein: 6.9 g/dL (ref 6.0–8.3)

## 2024-04-25 ENCOUNTER — Ambulatory Visit: Payer: Self-pay | Admitting: Family Medicine

## 2024-04-25 NOTE — Progress Notes (Signed)
 Her liver numbers are back to normal however she does have slightly low sodium.  I would like for her to come back in a couple more weeks to recheck 1 more set of labs.  Please place future order for bmet.

## 2024-04-26 ENCOUNTER — Other Ambulatory Visit: Payer: Self-pay | Admitting: Family Medicine

## 2024-04-26 ENCOUNTER — Ambulatory Visit: Admitting: Student

## 2024-04-26 ENCOUNTER — Encounter: Admitting: Student

## 2024-04-26 VITALS — BP 105/71 | HR 83

## 2024-04-26 DIAGNOSIS — Z170 Estrogen receptor positive status [ER+]: Secondary | ICD-10-CM

## 2024-04-26 DIAGNOSIS — R748 Abnormal levels of other serum enzymes: Secondary | ICD-10-CM

## 2024-04-26 DIAGNOSIS — C50011 Malignant neoplasm of nipple and areola, right female breast: Secondary | ICD-10-CM

## 2024-04-26 DIAGNOSIS — E870 Hyperosmolality and hypernatremia: Secondary | ICD-10-CM

## 2024-04-26 NOTE — Progress Notes (Signed)
 Patient is a 68 year old female with history of right breast cancer status post partial mastectomy.  She most recently underwent excision of excess right breast tissue and left breast mastopexy with Dr. Lowery on 04/17/2024.  Patient is a little over 1 week postop.  She presents to the clinic today for postoperative follow-up.  Today, patient reports she is overall doing well.  She states that she has some bruising on the right side.  She states that her pain is well-controlled.  She denies any fevers or chills.  She reports that she is eating and drinking without any issue.  She reports she is having bowel movements.  She denies any other issues or concerns at this time.  Chaperone present on exam.  On exam, patient is sitting upright in no acute distress.  Right breast is overall soft.  There is ecchymosis noted especially to the inferior aspect of the breast.  There are no obvious fluid collections palpated on exam.  Medial incision appears to be clean dry and intact.  The lateral incision appears to be intact with the Steri-Strip over it.  The left breast is soft, NAC is healthy.  There is no overlying erythema, no obvious fluid collections on exam.  Overall Steri-Strips are intact over the incision.  The most inferior Steri-Strip was coming off.  This was removed and replaced with a new 1.  There are no signs of infection on exam.  I discussed with the patient that her bruising should resolve over the next few weeks.  Recommended that she continue with her compression and avoid strenuous activities.  Patient expressed understanding.  Patient to follow back up at her next scheduled appointment.  I instructed her to call in the meantime if she has any questions or concerns about anything.

## 2024-04-29 DIAGNOSIS — H43813 Vitreous degeneration, bilateral: Secondary | ICD-10-CM | POA: Diagnosis not present

## 2024-04-29 DIAGNOSIS — H353221 Exudative age-related macular degeneration, left eye, with active choroidal neovascularization: Secondary | ICD-10-CM | POA: Diagnosis not present

## 2024-04-29 DIAGNOSIS — H35371 Puckering of macula, right eye: Secondary | ICD-10-CM | POA: Diagnosis not present

## 2024-04-29 DIAGNOSIS — Z961 Presence of intraocular lens: Secondary | ICD-10-CM | POA: Diagnosis not present

## 2024-04-29 DIAGNOSIS — H353112 Nonexudative age-related macular degeneration, right eye, intermediate dry stage: Secondary | ICD-10-CM | POA: Diagnosis not present

## 2024-05-06 DIAGNOSIS — M810 Age-related osteoporosis without current pathological fracture: Secondary | ICD-10-CM | POA: Diagnosis not present

## 2024-05-09 ENCOUNTER — Encounter: Payer: Self-pay | Admitting: Podiatry

## 2024-05-09 ENCOUNTER — Ambulatory Visit: Admitting: Podiatry

## 2024-05-09 DIAGNOSIS — G629 Polyneuropathy, unspecified: Secondary | ICD-10-CM

## 2024-05-09 NOTE — Progress Notes (Signed)
 Subjective:   Patient ID: Misty Bullock, female   DOB: 68 y.o.   MRN: [id number]   HPI Patient states over the last 5 months she has noted some numbness in her feet and cannot give a real history of this.  Patient states that she has been dealing with cancer and taking oral medication has had 3 surgeries not sure if this is involved.  Patient does not smoke likes to be active   Review of Systems  All other systems reviewed and are negative.       Objective:  Physical Exam Vitals and nursing note reviewed.  Constitutional:      Appearance: He is well-developed.  Pulmonary:     Effort: Pulmonary effort is normal.  Musculoskeletal:        General: Normal range of motion.  Skin:    General: Skin is warm.  Neurological:     Mental Status: He is alert.     Neurovascular status intact muscle strength found to be adequate range of motion adequate with patient having diminishment sharp dull vibratory and had seen family physician to confirm this.  She is walking with a normal heel-toe gait pattern and does not remember anything else has no back issues or other pathology which could explain something like this with history of restless leg syndrome.  Good digital perfusion well-oriented     Assessment:  Appears to be some form of neuropathy but not sure if it could be neuropraxia or could be related to other soft tissue or other pathology related to her back     Plan:  H&P reviewed and I discussed this at great length with her.  I do not see where medicine will be of value I do think she needs to be screened from a neurological standpoint and I recommended a neurologist and she will go through her family physician for referral.  If she still is having problems she will see me and I did discuss possible gabapentin  at 1 point but I do not see that is beneficial at this time

## 2024-05-10 ENCOUNTER — Encounter: Payer: Self-pay | Admitting: Plastic Surgery

## 2024-05-10 ENCOUNTER — Ambulatory Visit: Admitting: Plastic Surgery

## 2024-05-10 VITALS — BP 112/80 | HR 90 | Ht 66.0 in | Wt 117.2 lb

## 2024-05-10 DIAGNOSIS — D0511 Intraductal carcinoma in situ of right breast: Secondary | ICD-10-CM

## 2024-05-10 NOTE — Progress Notes (Signed)
 The patient is a 68 year old female here for follow-up after undergoing a left breast mastopexy and excision of right breast excess tissue.  She is doing really well and has much better symmetry and contour.  She does have some bruising as expected.  No sign of a hematoma or seroma.  Pictures at next visit.  Continue with sports bra.

## 2024-05-20 ENCOUNTER — Ambulatory Visit: Admitting: Surgical

## 2024-05-20 DIAGNOSIS — Z170 Estrogen receptor positive status [ER+]: Secondary | ICD-10-CM

## 2024-05-20 DIAGNOSIS — D0511 Intraductal carcinoma in situ of right breast: Secondary | ICD-10-CM

## 2024-05-20 DIAGNOSIS — C50011 Malignant neoplasm of nipple and areola, right female breast: Secondary | ICD-10-CM

## 2024-05-20 NOTE — Progress Notes (Signed)
 Patient is a 68 year old female here for left breast mastopexy and excision of right breast excess tissue follow-up.  She is about a month out from this procedure with Dr. Lowery.  She is doing good, bruising has mostly resolved.  She does have some bruising still present on the right side.  She does not have any specific concerns.  She does have questions about nipple areola tattoo on the right side.  Chaperone present on exam On exam left NAC is viable, left breast incisions intact.  Right breast incision intact with Steri-Strip in place.  Mild ecchymosis noted on inferior portion of right breast.  There is no erythema or cellulitic changes noted of either breast.  No subcutaneous fluid collection noted of either breast.   A/P:  Recommend following up in 2 weeks, continue with compressive garments, avoid strenuous activities or heavy lifting for 2 more weeks.  Avoid submerging incisions in water.  Discussed nipple areola tattoo on the right 3 months out from surgery at a minimum.  Pictures were obtained of the patient and placed in the chart with the patient's or guardian's permission.

## 2024-05-27 DIAGNOSIS — M810 Age-related osteoporosis without current pathological fracture: Secondary | ICD-10-CM | POA: Diagnosis not present

## 2024-05-30 ENCOUNTER — Telehealth: Payer: Self-pay | Admitting: Family Medicine

## 2024-05-30 NOTE — Telephone Encounter (Unsigned)
 Copied from CRM #[id number]. Topic: Referral - Question >> May 30, 2024  2:42 PM Gennette ORN wrote: Reason for CRM: Patient is needing a referral for neurologist.

## 2024-05-31 ENCOUNTER — Telehealth: Payer: Self-pay | Admitting: *Deleted

## 2024-05-31 ENCOUNTER — Encounter (HOSPITAL_BASED_OUTPATIENT_CLINIC_OR_DEPARTMENT_OTHER): Payer: Self-pay | Admitting: Emergency Medicine

## 2024-05-31 ENCOUNTER — Other Ambulatory Visit: Payer: Self-pay

## 2024-05-31 ENCOUNTER — Emergency Department (HOSPITAL_BASED_OUTPATIENT_CLINIC_OR_DEPARTMENT_OTHER)
Admission: EM | Admit: 2024-05-31 | Discharge: 2024-06-01 | Disposition: A | Discharge status: Home / Self Care | Attending: Emergency Medicine | Admitting: Emergency Medicine

## 2024-05-31 DIAGNOSIS — Z853 Personal history of malignant neoplasm of breast: Secondary | ICD-10-CM | POA: Insufficient documentation

## 2024-05-31 DIAGNOSIS — R040 Epistaxis: Secondary | ICD-10-CM | POA: Insufficient documentation

## 2024-05-31 DIAGNOSIS — R202 Paresthesia of skin: Secondary | ICD-10-CM

## 2024-05-31 NOTE — Telephone Encounter (Signed)
 Duplicated

## 2024-05-31 NOTE — ED Triage Notes (Addendum)
 Nose bleed x30 minutes. No thinners. Taking Anastrozole - aromatase inhibitor.

## 2024-05-31 NOTE — Telephone Encounter (Signed)
 I am happy to refer but can we get more information on why she is requesting the referral?  Worth HERO. Kennyth, MD 05/31/2024 8:30 AM

## 2024-05-31 NOTE — Telephone Encounter (Signed)
 Copied from CRM #[id number]. Topic: Referral - Question >> May 30, 2024  2:42 PM Gennette ORN wrote: Reason for CRM: Patient is needing a referral for neurologist.   Left message to return call to our office at their convenience.  Called patient to ask reason for neurology referral

## 2024-05-31 NOTE — Telephone Encounter (Signed)
 Ok with me. Please place any necessary orders.

## 2024-05-31 NOTE — Telephone Encounter (Signed)
 Copied from CRM #[id number]. Topic: Referral - Question >> May 30, 2024  2:42 PM Gennette ORN wrote: Reason for CRM: Patient is needing a referral for neurologist. >> May 31, 2024  2:12 PM Mia F wrote: Pt says she need the referral because she is still having a lot of tingling in the feet and they are also cold and numb.    Please advise  Kittie Krizan,RMA

## 2024-05-31 NOTE — Discharge Instructions (Signed)
 You were evaluated in the Emergency Department and after careful evaluation, we did not find any emergent condition requiring admission or further testing in the hospital.  Your exam/testing today is overall reassuring.  We were able to stop the bleeding here in the emergency department.  Can use firm pressure at home as we discussed.  Follow-up with your primary care doctor or oncologist to discuss your medications.  Please return to the Emergency Department if you experience any worsening of your condition.   Thank you for allowing us  to be a part of your care.

## 2024-05-31 NOTE — Telephone Encounter (Signed)
 Copied from CRM #[id number]. Topic: Referral - Question >> May 30, 2024  2:42 PM Gennette ORN wrote: Reason for CRM: Patient is needing a referral for neurologist. >> May 31, 2024  2:12 PM Mia F wrote: Pt says she need the referral because she is still having a lot of tingling in the feet and they are also cold and numb.    Duplicated Verneda Hollopeter,RMA

## 2024-05-31 NOTE — ED Provider Notes (Signed)
 [facility] Emergency Department Provider Note MRN:  [id number]  Arrival date & time: 05/31/24     Chief Complaint   Epistaxis   History of Present Illness   Misty Bullock is a 68 y.o. year-old female with a history of breast cancer presenting to the ED with chief complaint of epistaxis.  Believe her nose this evening and immediately started having epistaxis, felt like a large amount with clots, made her quite worried.  Called a friend to bring her to the emergency department.  Denies any other sources of bleeding, no blood thinners, no trauma to the nose.  Taking a new cancer medication for the past few months.  Review of Systems  A thorough review of systems was obtained and all systems are negative except as noted in the HPI and PMH.   Patient's Health History    Past Medical History:  Diagnosis Date   Anxiety    Cancer (HCC) 02/2024   right breast IDC with DCIS   History of chicken pox    Insomnia    Restless leg syndrome    UTI (urinary tract infection)     Past Surgical History:  Procedure Laterality Date   bilateral blerphoplasty      BREAST BIOPSY Right 12/20/2023   MM RT BREAST BX W LOC DEV 1ST LESION IMAGE BX SPEC STEREO GUIDE 12/20/2023 [facility]   BREAST BIOPSY  01/11/2024   MM RT RADIOACTIVE SEED LOC MAMMO GUIDE 01/11/2024 [facility]   BREAST LUMPECTOMY Right 02/15/2024   Procedure: BREAST LUMPECTOMY;  Surgeon: Curvin Deward MOULD, MD;  Location: [facility];  Service: General;  Laterality: Right;  RIGHT CENTRAL LUMPECTOMY   BREAST LUMPECTOMY WITH RADIOACTIVE SEED LOCALIZATION Right 01/12/2024   Procedure: BREAST LUMPECTOMY WITH RADIOACTIVE SEED LOCALIZATION;  Surgeon: Curvin Deward MOULD, MD;  Location: [facility];  Service: General;  Laterality: Right;   cataract surgery      MASTOPEXY Left 04/17/2024   Procedure: MASTOPEXY;  Surgeon: Lowery Estefana RAMAN, DO;  Location: [facility];  Service:  Plastics;  Laterality: Left;   OPEN REDUCTION INTERNAL FIXATION (ORIF) DISTAL RADIAL FRACTURE Left 03/03/2017   Procedure: OPEN REDUCTION INTERNAL FIXATION (ORIF) LEFT DISTAL RADIUS FRACTURE;  Surgeon: Vernetta Lonni GRADE, MD;  Location: [facility];  Service: Orthopedics;  Laterality: Left;   REVISION, RECONSTRUCTION, BREAST Right 04/17/2024   Procedure: REVISION, RECONSTRUCTION, BREAST;  Surgeon: Lowery Estefana RAMAN, DO;  Location: [facility];  Service: Plastics;  Laterality: Right;  excision excess breast tissue right breast   WISDOM TOOTH EXTRACTION      History reviewed. No pertinent family history.  Social History   Socioeconomic History   Marital status: Single    Spouse name: Not on file   Number of children: Not on file   Years of education: Not on file   Highest education level: Not on file  Occupational History   Not on file  Tobacco Use   Smoking status: Never   Smokeless tobacco: Never  Vaping Use   Vaping status: Never Used  Substance and Sexual Activity   Alcohol use: Yes    Comment: wine every evening   Drug use: No   Sexual activity: Not Currently    Birth control/protection: Post-menopausal  Other Topics Concern   Not on file  Social History Narrative   Not on file   Social Drivers of Health   Financial Resource Strain: Low Risk  (08/28/2023)   Overall Financial  Resource Strain (CARDIA)    Difficulty of Paying Living Expenses: Not hard at all  Food Insecurity: No Food Insecurity (01/30/2024)   Hunger Vital Sign    Worried About Running Out of Food in the Last Year: Never true    Ran Out of Food in the Last Year: Never true  Transportation Needs: No Transportation Needs (01/30/2024)   PRAPARE - Administrator, Civil Service (Medical): No    Lack of Transportation (Non-Medical): No  Physical Activity: Inactive (08/28/2023)   Exercise Vital Sign    Days of Exercise per Week: 0 days    Minutes of Exercise per Session: 0 min   Stress: No Stress Concern Present (08/28/2023)   [employer] of Occupational Health - Occupational Stress Questionnaire    Feeling of Stress : Only a little  Social Connections: Socially Isolated (08/28/2023)   Social Connection and Isolation Panel    Frequency of Communication with Friends and Family: More than three times a week    Frequency of Social Gatherings with Friends and Family: More than three times a week    Attends Religious Services: Never    Database administrator or Organizations: No    Attends Banker Meetings: Never    Marital Status: Never married  Intimate Partner Violence: Not At Risk (08/28/2023)   Humiliation, Afraid, Rape, and Kick questionnaire    Fear of Current or Ex-Partner: No    Emotionally Abused: No    Physically Abused: No    Sexually Abused: No     Physical Exam   Vitals:   05/31/24 2250 05/31/24 2252  BP: (!) 146/92   Pulse: 100   Resp: 16   Temp:  98 F (36.7 C)  SpO2: 98%     CONSTITUTIONAL: Well-appearing, NAD NEURO/PSYCH:  Alert and oriented x 3, no focal deficits EYES:  eyes equal and reactive ENT/NECK:  no LAD, no JVD CARDIO: Regular rate, well-perfused, normal S1 and S2 PULM:  CTAB no wheezing or rhonchi GI/GU:  non-distended, non-tender MSK/SPINE:  No gross deformities, no edema SKIN:  no rash, atraumatic   *Additional and/or pertinent findings included in MDM below  Diagnostic and Interventional Summary    EKG Interpretation Date/Time:    Ventricular Rate:    PR Interval:    QRS Duration:    QT Interval:    QTC Calculation:   R Axis:      Text Interpretation:         Labs Reviewed - No data to display  No orders to display    Medications - No data to display   Procedures  /  Critical Care Procedures  ED Course and Medical Decision Making  Initial Impression and Ddx Epistaxis at home, had firm pressure applied with nasal clamp by nursing.  No other bleeding sites, she had labs at PCP  couple weeks ago that were normal and so doubt significant anemia or thrombocytopenia.  Not on blood thinners  Past medical/surgical history that increases complexity of ED encounter: History of breast cancer  Interpretation of Diagnostics Laboratory and/or imaging options to aid in the diagnosis/care of the patient were considered.  After careful history and physical examination, it was determined that there was no indication for diagnostics at this time. Patient Reassessment and Ultimate Disposition/Management     On reassessment after taking down the clamp patient has achieved hemostasis, appropriate for discharge.  Patient management required discussion with the following services or consulting groups:  None  Complexity of Problems Addressed Acute complicated illness or Injury  Additional Data Reviewed and Analyzed Further history obtained from: Further history from spouse/family member  Additional Factors Impacting ED Encounter Risk None  Ozell HERO. Theadore, MD [facility] Emergency Medicine [facility] [email or web address]  Final Clinical Impressions(s) / ED Diagnoses     ICD-10-CM   1. Epistaxis  R04.0       ED Discharge Orders     None        Discharge Instructions Discussed with and Provided to Patient:    Discharge Instructions      You were evaluated in the Emergency Department and after careful evaluation, we did not find any emergent condition requiring admission or further testing in the hospital.  Your exam/testing today is overall reassuring.  We were able to stop the bleeding here in the emergency department.  Can use firm pressure at home as we discussed.  Follow-up with your primary care doctor or oncologist to discuss your medications.  Please return to the Emergency Department if you experience any worsening of your condition.   Thank you for allowing us  to be a part of your care.      Theadore Ozell HERO, MD 05/31/24 [phone]

## 2024-06-03 ENCOUNTER — Ambulatory Visit (INDEPENDENT_AMBULATORY_CARE_PROVIDER_SITE_OTHER): Admitting: Physician Assistant

## 2024-06-03 ENCOUNTER — Encounter: Payer: Self-pay | Admitting: Neurology

## 2024-06-03 DIAGNOSIS — D0511 Intraductal carcinoma in situ of right breast: Secondary | ICD-10-CM

## 2024-06-03 DIAGNOSIS — Z419 Encounter for procedure for purposes other than remedying health state, unspecified: Secondary | ICD-10-CM | POA: Diagnosis not present

## 2024-06-03 DIAGNOSIS — L821 Other seborrheic keratosis: Secondary | ICD-10-CM | POA: Diagnosis not present

## 2024-06-03 DIAGNOSIS — Z9889 Other specified postprocedural states: Secondary | ICD-10-CM

## 2024-06-03 DIAGNOSIS — Z85828 Personal history of other malignant neoplasm of skin: Secondary | ICD-10-CM | POA: Diagnosis not present

## 2024-06-03 DIAGNOSIS — Z719 Counseling, unspecified: Secondary | ICD-10-CM

## 2024-06-03 NOTE — Telephone Encounter (Signed)
 Spoke with the patient, informed her the referral was placed and someone will contact her with further information.

## 2024-06-03 NOTE — Progress Notes (Signed)
 Patient is a pleasant 68 year old female s/p excision of excess right breast tissue with left breast mastopexy performed 04/17/2024 by Dr. Lowery who presents to clinic for postoperative follow-up.  She was last seen here in clinic on 05/20/2024.  At that time, mild ecchymoses was noted on the inferior portion of right breast.  Otherwise, exam was benign.  Today, she is doing well.  She states that she was scared over the weekend when she noticed a dark line below her left areola and became concerned for nipple necrosis.  She spoke with the on-call provider who encouraged her to follow-up here in office today.  She denies any pain symptoms and reports that the bruising on her right breast has almost entirely resolved.  She plans to follow-up in November for right nipple areolar tattoo restoration.  On exam, she appears to have an excellent outcome.  Right mastectomy incision is well-healed.  No significant residual bruising on that side.  On the left side, left breast is soft and without palpable underlying fluid collections.  NAC healthy, warm and with excellent perfusion.  Inferior to the areola is her vertical limb which appears to be nicely healed.  No hypertrophy, slight hyperpigmentation.  Provided reassurance to patient and that the scar was part of her mastopexy procedure.  No evidence of keloiding.  Recommending silicone scar gel twice daily x 3 months to help with hyperpigmentation.  Can apply to the scars on both breasts.  She is cleared from a postoperative standpoint.  No pictures today.  Follow-up as scheduled in November for nipple areolar tattoo procedure.

## 2024-06-04 ENCOUNTER — Telehealth: Payer: Self-pay | Admitting: *Deleted

## 2024-06-04 NOTE — Telephone Encounter (Signed)
 This pt called stating she has been having severe nose bleeds since she's been on anastrozole . She had a recent ER visit r/t the nose bleed. She states the last nose bleed lasted for 6 hours. Advised pt to hold anastrozole  and f/u in 2 weeks at [facility] visit on 9/8. Pt verbalized understanding.

## 2024-06-05 ENCOUNTER — Telehealth: Payer: Self-pay

## 2024-06-05 NOTE — Telephone Encounter (Signed)
 Transition Care Management Follow-up Telephone Call Date of discharge and from where: 06/01/24 [facility] How have you been since you were released from the hospital? Better Any questions or concerns? No  Items Reviewed: Did the pt receive and understand the discharge instructions provided? Yes  Medications obtained and verified? Yes  Other? Yes  Holding new medication until seeing provider Any new allergies since your discharge? No  Dietary orders reviewed? No Do you have support at home? Yes   Home Care and Equipment/Supplies: Were home health services ordered? no If so, what is the name of the agency?   Has the agency set up a time to come to the patient's home? not applicable Were any new equipment or medical supplies ordered?  No What is the name of the medical supply agency?  Were you able to get the supplies/equipment? not applicable Do you have any questions related to the use of the equipment or supplies? No  Functional Questionnaire: (I = Independent and D = Dependent) ADLs: I  Bathing/Dressing- I  Meal Prep- I  Eating- I  Maintaining continence- I  Transferring/Ambulation- I  Managing Meds- I  Follow up appointments reviewed:  PCP Hospital f/u appt confirmed? Yes  Scheduled to see Worth Kitty on 06/07/24 @ 1:00 PM. Specialist Hospital f/u appt confirmed? Yes  Scheduled to see Oncology Nurse on 06/13/24 . Are transportation arrangements needed? No  If their condition worsens, is the pt aware to call PCP or go to the Emergency Dept.? Yes Was the patient provided with contact information for the PCP's office or ED? Yes Was to pt encouraged to call back with questions or concerns? Yes

## 2024-06-07 ENCOUNTER — Inpatient Hospital Stay: Admitting: Family Medicine

## 2024-06-17 ENCOUNTER — Inpatient Hospital Stay: Attending: Hematology and Oncology | Admitting: Adult Health

## 2024-06-17 ENCOUNTER — Encounter: Payer: Self-pay | Admitting: Adult Health

## 2024-06-17 VITALS — BP 122/66 | HR 88 | Temp 99.2°F | Resp 18 | Wt 117.9 lb

## 2024-06-17 DIAGNOSIS — R040 Epistaxis: Secondary | ICD-10-CM | POA: Diagnosis not present

## 2024-06-17 DIAGNOSIS — M810 Age-related osteoporosis without current pathological fracture: Secondary | ICD-10-CM | POA: Insufficient documentation

## 2024-06-17 DIAGNOSIS — C50111 Malignant neoplasm of central portion of right female breast: Secondary | ICD-10-CM | POA: Diagnosis not present

## 2024-06-17 DIAGNOSIS — Z170 Estrogen receptor positive status [ER+]: Secondary | ICD-10-CM | POA: Diagnosis not present

## 2024-06-17 DIAGNOSIS — C50011 Malignant neoplasm of nipple and areola, right female breast: Secondary | ICD-10-CM | POA: Diagnosis not present

## 2024-06-17 DIAGNOSIS — Z79811 Long term (current) use of aromatase inhibitors: Secondary | ICD-10-CM | POA: Diagnosis not present

## 2024-06-17 NOTE — Progress Notes (Signed)
 SURVIVORSHIP VISIT:  BRIEF ONCOLOGIC HISTORY:  Oncology History  Malignant neoplasm of areola of right breast in female, estrogen receptor positive (HCC)  12/20/2023 Initial Diagnosis   Screening mammogram detected indeterminate calcifications measuring 1.9 cm: Biopsy: High-grade DCIS ER 90%, PR 90%   01/12/2024 Surgery   Right lumpectomy: Grade 1 IDC 0.4 cm with high-grade DCIS, margins negative, LVI not identified, ER 80%, PR 95 %, Ki-67 1%, HER2 1+ negative   03/2024 -  Anti-estrogen oral therapy   1 mg Anastrozole    06/17/2024 Cancer Staging   Staging form: Breast, AJCC 8th Edition - Pathologic: Stage IA (pT1a, pN0, cM0, G1, ER+, PR+, HER2-) - Signed by Crawford Morna Pickle, NP on 06/17/2024 Histologic grading system: 3 grade system     INTERVAL HISTORY:  Misty Bullock to review Misty Bullock survivorship care plan detailing Misty Bullock treatment course for breast cancer, as well as monitoring long-term side effects of that treatment, education regarding health maintenance, screening, and overall wellness and health promotion.     Overall, Misty Bullock reports feeling quite well.  Misty Bullock had 3 episodes of epistaxis last appointment was a significant episode and required an ER visit.  At that time Misty Bullock stopped Misty anastrozole  and is here today for Misty Bullock survivorship visit and to follow-up on next steps.  Of note Misty Bullock has osteoporosis and receives Prolia every 6 months with Dr. Tawnya office.  We are awaiting Misty Bullock most recent bone density testing which occurred with him.  So Misty Bullock is Norton exotoxin weekly to have not seen since late know what Misty Bullock has been removed medication but Misty Bullock does not have any  REVIEW OF SYSTEMS:  Review of Systems  Constitutional:  Negative for appetite change, chills, fatigue, fever and unexpected weight change.  HENT:   Positive for nosebleeds. Negative for hearing loss, lump/mass, mouth sores and trouble swallowing.   Eyes:  Negative for eye problems and icterus.  Respiratory:  Negative for  chest tightness, cough and shortness of breath.   Cardiovascular:  Negative for chest pain, leg swelling and palpitations.  Gastrointestinal:  Negative for abdominal distention, abdominal pain, constipation, diarrhea, nausea and vomiting.  Endocrine: Negative for hot flashes.  Genitourinary:  Negative for difficulty urinating.   Musculoskeletal:  Negative for arthralgias.  Skin:  Negative for itching and rash.  Neurological:  Negative for dizziness, extremity weakness, headaches and numbness.  Hematological:  Negative for adenopathy. Does not bruise/bleed easily.  Psychiatric/Behavioral:  Negative for depression. Misty Bullock is not nervous/anxious.   Breast: Denies any new nodularity, masses, tenderness, nipple changes, or nipple discharge.     PAST MEDICAL/SURGICAL HISTORY:  Past Medical History:  Diagnosis Date   Anxiety    Cancer (HCC) 02/2024   right breast IDC with DCIS   History of chicken pox    Insomnia    Restless leg syndrome    UTI (urinary tract infection)    Past Surgical History:  Procedure Laterality Date   bilateral blerphoplasty      BREAST BIOPSY Right 12/20/2023   MM RT BREAST BX W LOC DEV 1ST LESION IMAGE BX SPEC STEREO GUIDE 12/20/2023 [facility]   BREAST BIOPSY  01/11/2024   MM RT RADIOACTIVE SEED LOC MAMMO GUIDE 01/11/2024 [facility]   BREAST LUMPECTOMY Right 02/15/2024   Procedure: BREAST LUMPECTOMY;  Surgeon: Curvin Deward MOULD, MD;  Location: [facility];  Service: General;  Laterality: Right;  RIGHT CENTRAL LUMPECTOMY   BREAST LUMPECTOMY WITH RADIOACTIVE SEED LOCALIZATION Right 01/12/2024   Procedure: BREAST LUMPECTOMY  WITH RADIOACTIVE SEED LOCALIZATION;  Surgeon: Curvin Deward MOULD, MD;  Location: [facility];  Service: General;  Laterality: Right;   cataract surgery      MASTOPEXY Left 04/17/2024   Procedure: MASTOPEXY;  Surgeon: Lowery Estefana RAMAN, DO;  Location: [facility];  Service: Plastics;  Laterality:  Left;   OPEN REDUCTION INTERNAL FIXATION (ORIF) DISTAL RADIAL FRACTURE Left 03/03/2017   Procedure: OPEN REDUCTION INTERNAL FIXATION (ORIF) LEFT DISTAL RADIUS FRACTURE;  Surgeon: Vernetta Lonni GRADE, MD;  Location: [facility];  Service: Orthopedics;  Laterality: Left;   REVISION, RECONSTRUCTION, BREAST Right 04/17/2024   Procedure: REVISION, RECONSTRUCTION, BREAST;  Surgeon: Lowery Estefana RAMAN, DO;  Location: [facility];  Service: Plastics;  Laterality: Right;  excision excess breast tissue right breast   WISDOM TOOTH EXTRACTION       ALLERGIES:  Allergies  Allergen Reactions   Latex Swelling    Swelling of lips during dental procedure   Augmentin  [Amoxicillin -Pot Clavulanate] Nausea And Vomiting     CURRENT MEDICATIONS:  Outpatient Encounter Medications as of 06/17/2024  Medication Sig   ALPRAZolam  (XANAX ) 0.5 MG tablet TAKE 1 TABLET BY MOUTH AT BEDTIME AS NEEDED FOR ANXIETY   cyanocobalamin  (VITAMIN B12) 1000 MCG tablet Take 1,000 mcg by mouth daily.   denosumab (PROLIA) 60 MG/ML SOSY injection Prolia 60 mg/mL subcutaneous syringe  Dispense 1 prefilled syringe to MD office for SQ injection Q 6 months   FLUoxetine  (PROZAC ) 40 MG capsule TAKE 1 CAPSULE (40 MG TOTAL) BY MOUTH DAILY.   folic acid  (FOLVITE ) 1 MG tablet Take 1 tablet (1 mg total) by mouth daily.   Multiple Vitamin (MULTIVITAMIN WITH MINERALS) TABS tablet Take 1 tablet by mouth daily.   valACYclovir (VALTREX) 500 MG tablet valacyclovir 500 mg tablet   zolpidem  (AMBIEN ) 10 MG tablet TAKE 1/2 TABLET BY MOUTH EVERY DAY AT BEDTIME   anastrozole  (ARIMIDEX ) 1 MG tablet Take 1 tablet (1 mg total) by mouth daily. (Bullock not taking: Reported on 06/05/2024)   ondansetron  (ZOFRAN ) 4 MG tablet Take 1 tablet (4 mg total) by mouth every 8 (eight) hours as needed for nausea or vomiting. (Bullock not taking: Reported on 06/17/2024)   No facility-administered encounter medications on file as of 06/17/2024.     ONCOLOGIC  FAMILY HISTORY:  No family history on file.   SOCIAL HISTORY:  Social History   Socioeconomic History   Marital status: Single    Spouse name: Not on file   Number of children: Not on file   Years of education: Not on file   Highest education level: Not on file  Occupational History   Not on file  Tobacco Use   Smoking status: Never   Smokeless tobacco: Never  Vaping Use   Vaping status: Never Used  Substance and Sexual Activity   Alcohol use: Yes    Comment: wine every evening   Drug use: No   Sexual activity: Not Currently    Birth control/protection: Post-menopausal  Other Topics Concern   Not on file  Social History Narrative   Not on file   Social Drivers of Health   Financial Resource Strain: Low Risk  (08/28/2023)   Overall Financial Resource Strain (CARDIA)    Difficulty of Paying Living Expenses: Not hard at all  Food Insecurity: No Food Insecurity (01/30/2024)   Hunger Vital Sign    Worried About Running Out of Food in Misty Last Year: Never true    Ran Out of Food in  Misty Last Year: Never true  Transportation Needs: No Transportation Needs (01/30/2024)   PRAPARE - Administrator, Civil Service (Medical): No    Lack of Transportation (Non-Medical): No  Physical Activity: Inactive (08/28/2023)   Exercise Vital Sign    Days of Exercise per Week: 0 days    Minutes of Exercise per Session: 0 min  Stress: No Stress Concern Present (08/28/2023)   [employer] of Occupational Health - Occupational Stress Questionnaire    Feeling of Stress : Only a little  Social Connections: Socially Isolated (08/28/2023)   Social Connection and Isolation Panel    Frequency of Communication with Friends and Family: More than three times a week    Frequency of Social Gatherings with Friends and Family: More than three times a week    Attends Religious Services: Never    Database administrator or Organizations: No    Attends Banker Meetings: Never     Marital Status: Never married  Intimate Partner Violence: Not At Risk (08/28/2023)   Humiliation, Afraid, Rape, and Kick questionnaire    Fear of Current or Ex-Partner: No    Emotionally Abused: No    Physically Abused: No    Sexually Abused: No     OBSERVATIONS/OBJECTIVE:  BP 122/66 (BP Location: Left Arm, Bullock Position: Sitting, Cuff Size: Small)   Pulse 88   Temp 99.2 F (37.3 C) (Temporal)   Resp 18   Wt 117 lb 14.4 oz (53.5 kg)   LMP  (LMP Unknown)   SpO2 100%   BMI 19.03 kg/m  GENERAL: Bullock is a well appearing female in no acute distress HEENT:  Sclerae anicteric.  Oropharynx clear and moist. No ulcerations or evidence of oropharyngeal candidiasis. Neck is supple.  NODES:  No cervical, supraclavicular, or axillary lymphadenopathy palpated.  BREAST EXAM:  right breast s/p lumpectomy, no sign of local recurrence, left bresat s/p reconstruction, benign LUNGS:  Clear to auscultation bilaterally.  No wheezes or rhonchi. HEART:  Regular rate and rhythm. No murmur appreciated. ABDOMEN:  Soft, nontender.  Positive, normoactive bowel sounds. No organomegaly palpated. MSK:  No focal spinal tenderness to palpation. Full range of motion bilaterally in Misty upper extremities. EXTREMITIES:  No peripheral edema.   SKIN:  Clear with no obvious rashes or skin changes. No nail dyscrasia. NEURO:  Nonfocal. Well oriented.  Appropriate affect.   LABORATORY DATA:  None for this visit.  DIAGNOSTIC IMAGING:  None for this visit.      ASSESSMENT AND PLAN:  Mr.. Ancheta is a pleasant 68 y.o. female with Stage IA right breast invasive ductal carcinoma, ER+/PR+/HER2-, diagnosed in 12/2023, treated with lumpectomy and anti-estrogen therapy with Anastrozole  beginning in 03/2024.  Misty Bullock [facility] for our initial meeting and routine follow-up post-completion of treatment for breast cancer.    1. Stage IA right breast cancer:  Mr. Stepney is continuing to recover from  definitive treatment for breast cancer. Misty Bullock will follow-up with Misty Bullock medical oncologist, Dr.  Odean in 02/2025 months with history and physical exam per surveillance protocol.  Misty Bullock will restart Anastrozole , see #2.  Misty Bullock mammogram is due 11/2023; orders placed today.   Today, a comprehensive survivorship care plan and treatment summary was reviewed with Misty Bullock today detailing Misty Bullock breast cancer diagnosis, treatment course, potential late/long-term effects of treatment, appropriate follow-up care with recommendations for Misty future, and Bullock education resources.  A copy of this summary, along with a letter will be sent to  Misty Bullock's primary care provider via mail/fax/In Basket message after today's visit.    2.  Epistaxis: We discussed Misty Bullock epistaxis.  I recommended that Misty Bullock use saline nasal spray intranasally twice daily.  We discussed Misty option of changing to tamoxifen since Misty Bullock also has osteoporosis.  After discussion Misty Bullock opted to retry Misty anastrozole  and if Misty Bullock develops another episode of epistaxis Misty Bullock will let me know and I will then change Misty Bullock to tamoxifen.  We discussed benefits and efficacy along with side effects of each of Misty medications.  3. Bone health:  Given Mr. Schumpert's age/history of breast cancer and Misty Bullock current treatment regimen including anti-estrogen therapy with Anastrozole , Misty Bullock is at risk for bone demineralization.  Misty Bullock has osteoporosis and is currently being treated with Prolia every 6 months by Dr. Tawnya office.  We requested their most recent bone density testing report.  Misty Bullock is recommended to continue this therapy, especially while on anastrozole .  Misty Bullock was given education on specific activities to promote bone health.  4. Cancer screening:  Due to Mr. Bramblett's history and Misty Bullock age, Misty Bullock should receive screening for skin cancers, colon cancer, and gynecologic cancers.  Misty information and recommendations are listed on Misty Bullock's comprehensive care plan/treatment summary and were  reviewed in detail with Misty Bullock.    5. Health maintenance and wellness promotion: Mr. Cherian was encouraged to consume 5-7 servings of fruits and vegetables per day. We reviewed Misty Nutrition Rainbow handout.  Misty Bullock was also encouraged to engage in moderate to vigorous exercise for 30 minutes per day most days of Misty week.  Misty Bullock was instructed to limit Misty Bullock alcohol consumption and continue to abstain from tobacco use.     6. Support services/counseling: It is not uncommon for this period of Misty Bullock's cancer care trajectory to be one of many emotions and stressors.   Misty Bullock was given information regarding our available services and encouraged to contact me with any questions or for help enrolling in any of our support group/programs.    Follow up instructions:    -Return to cancer center for follow-up with Dr. Odean in 02/2025  -Mammogram due in 11/2024 -DEXA-f/u with Dr. Leva office -Follow up with Dr. Curvin in 08/2024 -Misty Bullock is welcome to return back to [facility] at any time; no additional follow-up needed at this time.  -Consider referral back to survivorship as a long-term survivor for continued surveillance  Misty Bullock was provided an opportunity to ask questions and all were answered. Misty Bullock agreed with Misty plan and demonstrated an understanding of Misty instructions.   Total encounter time: 45 minutes*in face-to-face visit time, chart review, lab review, care coordination, order entry, and documentation of Misty encounter time.    Morna Kendall, NP 06/17/24 1:49 PM Medical Oncology and Hematology [facility] [address] [facility], [place] [address] Tel. [phone]    Fax. [phone]  *Total Encounter Time as defined by Misty [facility] for Medicare and Medicaid Services includes, in addition to Misty face-to-face time of a Bullock visit (documented in Misty note above) non-face-to-face time: obtaining and reviewing outside history, ordering and reviewing  medications, tests or procedures, care coordination (communications with other health care professionals or caregivers) and documentation in Misty medical record.

## 2024-06-24 ENCOUNTER — Encounter: Payer: Self-pay | Admitting: Obstetrics and Gynecology

## 2024-07-04 ENCOUNTER — Other Ambulatory Visit: Payer: Self-pay | Admitting: Family Medicine

## 2024-07-15 DIAGNOSIS — H353221 Exudative age-related macular degeneration, left eye, with active choroidal neovascularization: Secondary | ICD-10-CM | POA: Diagnosis not present

## 2024-07-16 ENCOUNTER — Ambulatory Visit: Admitting: Neurology

## 2024-07-16 ENCOUNTER — Encounter: Payer: Self-pay | Admitting: Neurology

## 2024-07-16 ENCOUNTER — Other Ambulatory Visit

## 2024-07-16 VITALS — BP 118/75 | HR 82 | Ht 66.0 in | Wt 118.0 lb

## 2024-07-16 DIAGNOSIS — R202 Paresthesia of skin: Secondary | ICD-10-CM | POA: Diagnosis not present

## 2024-07-16 NOTE — Progress Notes (Signed)
 [facility] Clinic Note - Initial Visit   Date: 07/16/2024   Misty Bullock MRN: [id number] DOB: 10/16/1955   Dear Dr. Kennyth:  Thank you for your kind referral of Misty Bullock for consultation of neuropathy. Although his history is well known to you, please allow us  to reiterate it for the purpose of our medical record. The patient was accompanied to the clinic by self.    Misty Bullock is a 68 y.o. right-handed female with right breast cancer s/p lumpectomy presenting for evaluation of bilateral feet paresthesias.   IMPRESSION/PLAN: Bilateral feet paresthesias, most likely neuropathy.  Risk factors:  alcohol  - Check vitamin B1, copper, SPEP with IFE  - NCS/EMG of the legs  - OK to try OTC lidocaine  ointment  - Recommend cutting back/abstain from alcohol  - Fall precautions discussed  Return to clinic in 6 months  ------------------------------------------------------------- History of present illness: Starting around the spring of 2025, she began having numbness involving toes, soles, and dorsum of the feet.  She has mild tingling and cold sensation.  Symptoms constant, no specific exacerbating or alleviating factors.  No associated back pain, weakness, or imbalance.  She saw her PCP who check vitamin B12 (normal) and folate deficiency.  She has been compliant with taking supplements.   Nonsmoker.  She drinks 2-3 glasses of wine for the past 20 years.  She lives alone and has one son in [place] .    Out-side paper records, electronic medical record, and images have been reviewed where available and summarized as:  Lab Results  Component Value Date   FOLATE 3.3 (L) 03/29/2024    Lab Results  Component Value Date   HGBA1C 5.4 03/29/2024   Lab Results  Component Value Date   VITAMINB12 370 03/29/2024   Lab Results  Component Value Date   TSH 0.80 03/29/2024    Past Medical History:  Diagnosis Date   Anxiety    Cancer (HCC) 02/2024   right  breast IDC with DCIS   History of chicken pox    Insomnia    Restless leg syndrome    UTI (urinary tract infection)     Past Surgical History:  Procedure Laterality Date   bilateral blerphoplasty      BREAST BIOPSY Right 12/20/2023   MM RT BREAST BX W LOC DEV 1ST LESION IMAGE BX SPEC STEREO GUIDE 12/20/2023 [facility]   BREAST BIOPSY  01/11/2024   MM RT RADIOACTIVE SEED LOC MAMMO GUIDE 01/11/2024 [facility]   BREAST LUMPECTOMY Right 02/15/2024   Procedure: BREAST LUMPECTOMY;  Surgeon: Curvin Deward MOULD, MD;  Location: [facility];  Service: General;  Laterality: Right;  RIGHT CENTRAL LUMPECTOMY   BREAST LUMPECTOMY WITH RADIOACTIVE SEED LOCALIZATION Right 01/12/2024   Procedure: BREAST LUMPECTOMY WITH RADIOACTIVE SEED LOCALIZATION;  Surgeon: Curvin Deward MOULD, MD;  Location: [facility];  Service: General;  Laterality: Right;   cataract surgery      MASTOPEXY Left 04/17/2024   Procedure: MASTOPEXY;  Surgeon: Lowery Estefana RAMAN, DO;  Location: [facility];  Service: Plastics;  Laterality: Left;   OPEN REDUCTION INTERNAL FIXATION (ORIF) DISTAL RADIAL FRACTURE Left 03/03/2017   Procedure: OPEN REDUCTION INTERNAL FIXATION (ORIF) LEFT DISTAL RADIUS FRACTURE;  Surgeon: Vernetta Lonni GRADE, MD;  Location: [facility];  Service: Orthopedics;  Laterality: Left;   REVISION, RECONSTRUCTION, BREAST Right 04/17/2024   Procedure: REVISION, RECONSTRUCTION, BREAST;  Surgeon: Lowery Estefana RAMAN, DO;  [facility];  Service: Government social research officer;  Laterality: Right;  excision excess breast tissue right breast   WISDOM TOOTH EXTRACTION       Medications:  Outpatient Encounter Medications as of 07/16/2024  Medication Sig   ALPRAZolam  (XANAX ) 0.5 MG tablet TAKE 1 TABLET BY MOUTH AT BEDTIME AS NEEDED FOR ANXIETY   anastrozole  (ARIMIDEX ) 1 MG tablet Take 1 tablet (1 mg total) by mouth daily.   anastrozole  (ARIMIDEX ) 1 MG tablet Take 1 mg by mouth daily.    cyanocobalamin  (VITAMIN B12) 1000 MCG tablet Take 1,000 mcg by mouth daily.   denosumab (PROLIA) 60 MG/ML SOSY injection Prolia 60 mg/mL subcutaneous syringe  Dispense 1 prefilled syringe to MD office for SQ injection Q 6 months   FLUoxetine  (PROZAC ) 40 MG capsule TAKE 1 CAPSULE (40 MG TOTAL) BY MOUTH DAILY.   folic acid  (FOLVITE ) 1 MG tablet Take 1 tablet (1 mg total) by mouth daily.   Multiple Vitamin (MULTIVITAMIN WITH MINERALS) TABS tablet Take 1 tablet by mouth daily.   ondansetron  (ZOFRAN ) 4 MG tablet Take 1 tablet (4 mg total) by mouth every 8 (eight) hours as needed for nausea or vomiting.   valACYclovir (VALTREX) 500 MG tablet valacyclovir 500 mg tablet   zolpidem  (AMBIEN ) 10 MG tablet TAKE 1/2 TABLET BY MOUTH EVERY DAY AT BEDTIME   No facility-administered encounter medications on file as of 07/16/2024.    Allergies:  Allergies  Allergen Reactions   Latex Swelling    Swelling of lips during dental procedure   Augmentin  [Amoxicillin -Pot Clavulanate] Nausea And Vomiting    Family History: History reviewed. No pertinent family history.  Social History: Social History   Tobacco Use   Smoking status: Never   Smokeless tobacco: Never  Vaping Use   Vaping status: Never Used  Substance Use Topics   Alcohol use: Yes    Comment: wine every evening   Drug use: No   Social History   Social History Narrative   Are you right handed or left handed? Right Handed    Are you currently employed ? no   What is your current occupation? Retired    Do you live at home alone? Yes   Who lives with you?    What type of home do you live in: 1 story or 2 story? Lives in a one story home.         Vital Signs:  BP 118/75   Pulse 82   Ht 5' 6 (1.676 m)   Wt 118 lb (53.5 kg)   LMP  (LMP Unknown)   SpO2 99%   BMI 19.05 kg/m    Neurological Exam: MENTAL STATUS including orientation to time, place, person, recent and remote memory, attention span and concentration, language, and  fund of knowledge is normal.  Speech is not dysarthric.  CRANIAL NERVES: II:  No visual field defects.     III-IV-VI: Pupils equal round and reactive to light.  Normal conjugate, extra-ocular eye movements in all directions of gaze.  No nystagmus.  No ptosis.   V:  Normal facial sensation.    VII:  Normal facial symmetry and movements.   VIII:  Normal hearing and vestibular function.   IX-X:  Normal palatal movement.   XI:  Normal shoulder shrug and head rotation.   XII:  Normal tongue strength and range of motion, no deviation or fasciculation.  MOTOR:  No atrophy, fasciculations or abnormal movements.  No pronator drift.   Upper Extremity:  Right  Left  Deltoid  5/5  5/5   Biceps  5/5   5/5   Triceps  5/5   5/5   Wrist extensors  5/5   5/5   Wrist flexors  5/5   5/5   Finger extensors  5/5   5/5   Finger flexors  5/5   5/5   Dorsal interossei  5/5   5/5   Abductor pollicis  5/5   5/5   Tone (Ashworth scale)  0  0   Lower Extremity:  Right  Left  Hip flexors  5/5   5/5   Knee flexors  5/5   5/5   Knee extensors  5/5   5/5   Dorsiflexors  5/5   5/5   Plantarflexors  5/5   5/5   Toe extensors  5/5   5/5   Toe flexors  5/5   5/5   Tone (Ashworth scale)  0  0   MSRs:                                           Right        Left brachioradialis 2+  2+  biceps 2+  2+  triceps 2+  2+  patellar 2+  2+  ankle jerk 1+  1+  Hoffman no  no  plantar response down  down   SENSORY:  Diminished vibration at the great toe bilaterally.  Pin pick and temperature intact throughout.  Romberg's sign absent.   COORDINATION/GAIT: Normal finger-to- nose-finger.  Intact rapid alternating movements bilaterally.   Gait shows mild valgus deformity of the right knee, stable, unassisted.  Tandem and stressed gait intact.     Thank you for allowing me to participate in patient's care.  If I can answer any additional questions, I would be pleased to do so.    Sincerely,    Ysabella Babiarz K. Tobie,  DO

## 2024-07-16 NOTE — Patient Instructions (Addendum)
 Check labs  Nerve testing of the legs  You can apply over the counter lidocaine  (Aspercream, Salonpas) cream for pain as needed  Try to cut back on wine  ELECTROMYOGRAM AND NERVE CONDUCTION STUDIES (EMG/NCS) INSTRUCTIONS  How to Prepare The neurologist conducting the EMG will need to know if you have certain medical conditions. Tell the neurologist and other EMG lab personnel if you: Have a pacemaker or any other electrical medical device Take blood-thinning medications Have hemophilia, a blood-clotting disorder that causes prolonged bleeding Bathing Take a shower or bath shortly before your exam in order to remove oils from your skin. Don't apply lotions or creams before the exam.  What to Expect You'll likely be asked to change into a hospital gown for the procedure and lie down on an examination table. The following explanations can help you understand what will happen during the exam.  Electrodes. The neurologist or a technician places surface electrodes at various locations on your skin depending on where you're experiencing symptoms. Or the neurologist may insert needle electrodes at different sites depending on your symptoms.  Sensations. The electrodes will at times transmit a tiny electrical current that you may feel as a twinge or spasm. The needle electrode may cause discomfort or pain that usually ends shortly after the needle is removed. If you are concerned about discomfort or pain, you may want to talk to the neurologist about taking a short break during the exam.  Instructions. During the needle EMG, the neurologist will assess whether there is any spontaneous electrical activity when the muscle is at rest - activity that isn't present in healthy muscle tissue - and the degree of activity when you slightly contract the muscle.  He or she will give you instructions on resting and contracting a muscle at appropriate times. Depending on what muscles and nerves the neurologist is  examining, he or she may ask you to change positions during the exam.  After your EMG You may experience some temporary, minor bruising where the needle electrode was inserted into your muscle. This bruising should fade within several days. If it persists, contact your primary care doctor.

## 2024-07-20 LAB — PROTEIN ELECTROPHORESIS, SERUM
Albumin ELP: 4.7 g/dL (ref 3.8–4.8)
Alpha 1: 0.3 g/dL (ref 0.2–0.3)
Alpha 2: 0.7 g/dL (ref 0.5–0.9)
Beta 2: 0.4 g/dL (ref 0.2–0.5)
Beta Globulin: 0.5 g/dL (ref 0.4–0.6)
Gamma Globulin: 0.9 g/dL (ref 0.8–1.7)
Total Protein: 7.5 g/dL (ref 6.1–8.1)

## 2024-07-20 LAB — IMMUNOFIXATION ELECTROPHORESIS
IgG (Immunoglobin G), Serum: 947 mg/dL (ref 600–1540)
IgM, Serum: 153 mg/dL (ref 50–300)
Immunoglobulin A: 359 mg/dL — ABNORMAL HIGH (ref 70–320)

## 2024-07-20 LAB — VITAMIN B1: Vitamin B1 (Thiamine): 14 nmol/L (ref 8–30)

## 2024-07-20 LAB — COPPER, SERUM: Copper: 117 ug/dL (ref 70–175)

## 2024-07-23 ENCOUNTER — Ambulatory Visit: Payer: Self-pay | Admitting: Neurology

## 2024-08-13 ENCOUNTER — Ambulatory Visit: Admitting: Neurology

## 2024-08-20 ENCOUNTER — Institutional Professional Consult (permissible substitution): Admitting: Surgical

## 2024-08-21 ENCOUNTER — Ambulatory Visit: Admitting: Physician Assistant

## 2024-08-21 DIAGNOSIS — Z853 Personal history of malignant neoplasm of breast: Secondary | ICD-10-CM | POA: Diagnosis not present

## 2024-08-21 DIAGNOSIS — N651 Disproportion of reconstructed breast: Secondary | ICD-10-CM

## 2024-08-21 DIAGNOSIS — Z9011 Acquired absence of right breast and nipple: Secondary | ICD-10-CM | POA: Diagnosis not present

## 2024-08-21 DIAGNOSIS — D0511 Intraductal carcinoma in situ of right breast: Secondary | ICD-10-CM

## 2024-08-21 NOTE — Progress Notes (Signed)
 Referring Provider Misty Worth HERO, MD [address] [place],  [place] [address]   CC:  Chief Complaint  Patient presents with   Advice Only      Misty Bullock is an 68 y.o. female.  HPI: Patient is a pleasant 68 year old female with history of right breast cancer with unilateral partial mastectomy and acquired absence of right nipple who presents to clinic to discuss nipple areolar tattoo restoration.  Patient tells me that she is interested in nipple areolar tattoo restoration.  However, she told me that she is also interested in the possibility of right breast implant placement for improved symmetry.  She states that this was not previously discussed as an option and is not sure if she is even eligible.  Patient thinks that this would be better to address prior to tattoo restoration.  She does not have any history of breast radiation and takes anastrozole .  She is not on any anticoagulation.   Allergies  Allergen Reactions   Latex Swelling    Swelling of lips during dental procedure   Augmentin  [Amoxicillin -Pot Clavulanate] Nausea And Vomiting    Outpatient Encounter Medications as of 08/21/2024  Medication Sig   ALPRAZolam  (XANAX ) 0.5 MG tablet TAKE 1 TABLET BY MOUTH AT BEDTIME AS NEEDED FOR ANXIETY   anastrozole  (ARIMIDEX ) 1 MG tablet Take 1 tablet (1 mg total) by mouth daily.   anastrozole  (ARIMIDEX ) 1 MG tablet Take 1 mg by mouth daily.   cyanocobalamin  (VITAMIN B12) 1000 MCG tablet Take 1,000 mcg by mouth daily.   denosumab (PROLIA) 60 MG/ML SOSY injection Prolia 60 mg/mL subcutaneous syringe  Dispense 1 prefilled syringe to MD office for SQ injection Q 6 months   FLUoxetine  (PROZAC ) 40 MG capsule TAKE 1 CAPSULE (40 MG TOTAL) BY MOUTH DAILY.   folic acid  (FOLVITE ) 1 MG tablet Take 1 tablet (1 mg total) by mouth daily.   Multiple Vitamin (MULTIVITAMIN WITH MINERALS) TABS tablet Take 1 tablet by mouth daily.   ondansetron  (ZOFRAN ) 4 MG tablet Take 1 tablet (4 mg total) by  mouth every 8 (eight) hours as needed for nausea or vomiting.   valACYclovir (VALTREX) 500 MG tablet valacyclovir 500 mg tablet   zolpidem  (AMBIEN ) 10 MG tablet TAKE 1/2 TABLET BY MOUTH EVERY DAY AT BEDTIME   No facility-administered encounter medications on file as of 08/21/2024.     Past Medical History:  Diagnosis Date   Anxiety    Cancer (HCC) 02/2024   right breast IDC with DCIS   History of chicken pox    Insomnia    Restless leg syndrome    UTI (urinary tract infection)     Past Surgical History:  Procedure Laterality Date   bilateral blerphoplasty      BREAST BIOPSY Right 12/20/2023   MM RT BREAST BX W LOC DEV 1ST LESION IMAGE BX SPEC STEREO GUIDE 12/20/2023 [facility]   BREAST BIOPSY  01/11/2024   MM RT RADIOACTIVE SEED LOC MAMMO GUIDE 01/11/2024 [facility]   BREAST LUMPECTOMY Right 02/15/2024   Procedure: BREAST LUMPECTOMY;  Surgeon: Misty Deward MOULD, MD;  Location: [facility];  Service: General;  Laterality: Right;  RIGHT CENTRAL LUMPECTOMY   BREAST LUMPECTOMY WITH RADIOACTIVE SEED LOCALIZATION Right 01/12/2024   Procedure: BREAST LUMPECTOMY WITH RADIOACTIVE SEED LOCALIZATION;  Surgeon: Misty Deward MOULD, MD;  Location: [facility];  Service: General;  Laterality: Right;   cataract surgery      MASTOPEXY Left 04/17/2024   Procedure: MASTOPEXY;  Surgeon: Misty Estefana RAMAN, DO;  Location: [facility];  Service: Plastics;  Laterality: Left;   OPEN REDUCTION INTERNAL FIXATION (ORIF) DISTAL RADIAL FRACTURE Left 03/03/2017   Procedure: OPEN REDUCTION INTERNAL FIXATION (ORIF) LEFT DISTAL RADIUS FRACTURE;  Surgeon: Misty Lonni GRADE, MD;  Location: [facility];  Service: Orthopedics;  Laterality: Left;   REVISION, RECONSTRUCTION, BREAST Right 04/17/2024   Procedure: REVISION, RECONSTRUCTION, BREAST;  Surgeon: Misty Estefana RAMAN, DO;  Location: [facility];  Service: Plastics;  Laterality: Right;  excision excess breast  tissue right breast   WISDOM TOOTH EXTRACTION      No family history on file.  Social History   Social History Narrative   Are you right handed or left handed? Right Handed    Are you currently employed ? no   What is your current occupation? Retired    Do you live at home alone? Yes   Who lives with you?    What type of home do you live in: 1 story or 2 story? Lives in a one story home.          Review of Systems General: Denies fevers or chills Cardio: Denies chest pain Pulmonary: Denies difficulty breathing  Physical Exam    07/16/2024    2:49 PM 06/17/2024    1:10 PM 05/31/2024   10:50 PM  Vitals with BMI  Height 5' 6    Weight 118 lbs 117 lbs 14 oz   BMI 19.05    Systolic 118 122 853  Diastolic 75 66 92  Pulse 82 88 100    General:  No acute distress, nontoxic appearing  Respiratory: No increased work of breathing Neuro: Alert and oriented Psychiatric: Normal mood and affect   Assessment/Plan  Acquired absence right nipple after right partial mastectomy: - She is an excellent candidate for NAC tattoo restoration.  However, I agree with her that she should consult with Dr. Lowery about the possibility of implant placement so that she is fully satisfied with the reconstruction prior to the tattooing. - Will have her schedule a consult with Dr. Lowery.  Consents for NAC tattoo restoration were obtained here in clinic today and will be scanned into the chart.  If she chooses not to move forward with implant placement, we will book her for unilateral tattoo session.   Misty Seip PA-C 08/21/2024, 2:01 PM

## 2024-08-26 ENCOUNTER — Ambulatory Visit: Admitting: Plastic Surgery

## 2024-08-26 ENCOUNTER — Encounter: Payer: Self-pay | Admitting: Plastic Surgery

## 2024-08-26 ENCOUNTER — Telehealth: Payer: Self-pay

## 2024-08-26 VITALS — BP 119/83 | HR 72

## 2024-08-26 DIAGNOSIS — C50011 Malignant neoplasm of nipple and areola, right female breast: Secondary | ICD-10-CM

## 2024-08-26 DIAGNOSIS — N651 Disproportion of reconstructed breast: Secondary | ICD-10-CM

## 2024-08-26 DIAGNOSIS — Z9011 Acquired absence of right breast and nipple: Secondary | ICD-10-CM

## 2024-08-26 DIAGNOSIS — Z170 Estrogen receptor positive status [ER+]: Secondary | ICD-10-CM | POA: Diagnosis not present

## 2024-08-26 DIAGNOSIS — Z853 Personal history of malignant neoplasm of breast: Secondary | ICD-10-CM

## 2024-08-26 NOTE — Telephone Encounter (Signed)
 Faxed [facility] prescription with additional paperwork. Confirmation of receipt.

## 2024-08-26 NOTE — Progress Notes (Signed)
   Subjective:    Patient ID: Misty Bullock, female    DOB: 24-Oct-1955, 68 y.o.   MRN: [id number]  The patient is a 68 year old female here for further evaluation of her breast.  She was first seen in June 2025.  She had right breast cancer and underwent a partial mastectomy.  In July 2025 she had right breast excision of excess tissue and a left breast mastopexy.  She is not planning on radiation.  She has a little bit of asymmetry so she was wondering if something could be done to make the right breast a little bit bigger.    Review of Systems  Constitutional: Negative.   Eyes: Negative.   Respiratory: Negative.    Cardiovascular: Negative.   Gastrointestinal: Negative.   Endocrine: Negative.   Genitourinary: Negative.   Musculoskeletal: Negative.        Objective:   Physical Exam Vitals reviewed.  Constitutional:      Appearance: Normal appearance.  HENT:     Head: Atraumatic.  Cardiovascular:     Rate and Rhythm: Normal rate.     Pulses: Normal pulses.  Skin:    Capillary Refill: Capillary refill takes less than 2 seconds.  Neurological:     Mental Status: He is oriented to person, place, and time.  Psychiatric:        Mood and Affect: Mood normal.        Behavior: Behavior normal.        Thought Content: Thought content normal.        Judgment: Judgment normal.         Assessment & Plan:     ICD-10-CM   1. Malignant neoplasm of areola of right breast in female, estrogen receptor positive (HCC)  C50.011    Z17.0         We discussed options of implant in her bra, silicone implant above the muscle and fat grafting.  The patient was given information for [facility].  She is going to think about it and let us  know what her thoughts are.  She is aware that she can certainly change her mind as the years go by as well.

## 2024-08-30 DIAGNOSIS — Z170 Estrogen receptor positive status [ER+]: Secondary | ICD-10-CM | POA: Diagnosis not present

## 2024-08-30 DIAGNOSIS — C50111 Malignant neoplasm of central portion of right female breast: Secondary | ICD-10-CM | POA: Diagnosis not present

## 2024-09-02 ENCOUNTER — Ambulatory Visit: Payer: Medicare PPO | Admitting: Family Medicine

## 2024-09-02 ENCOUNTER — Encounter: Payer: Self-pay | Admitting: Family Medicine

## 2024-09-02 VITALS — BP 102/62 | HR 74 | Temp 97.4°F | Ht 66.0 in | Wt 118.4 lb

## 2024-09-02 DIAGNOSIS — C50011 Malignant neoplasm of nipple and areola, right female breast: Secondary | ICD-10-CM | POA: Diagnosis not present

## 2024-09-02 DIAGNOSIS — Z170 Estrogen receptor positive status [ER+]: Secondary | ICD-10-CM

## 2024-09-02 DIAGNOSIS — E782 Mixed hyperlipidemia: Secondary | ICD-10-CM

## 2024-09-02 DIAGNOSIS — F419 Anxiety disorder, unspecified: Secondary | ICD-10-CM | POA: Diagnosis not present

## 2024-09-02 DIAGNOSIS — R739 Hyperglycemia, unspecified: Secondary | ICD-10-CM

## 2024-09-02 DIAGNOSIS — F5101 Primary insomnia: Secondary | ICD-10-CM | POA: Diagnosis not present

## 2024-09-02 DIAGNOSIS — Z0000 Encounter for general adult medical examination without abnormal findings: Secondary | ICD-10-CM

## 2024-09-02 DIAGNOSIS — M810 Age-related osteoporosis without current pathological fracture: Secondary | ICD-10-CM | POA: Diagnosis not present

## 2024-09-02 DIAGNOSIS — G629 Polyneuropathy, unspecified: Secondary | ICD-10-CM

## 2024-09-02 LAB — COMPREHENSIVE METABOLIC PANEL WITH GFR
ALT: 26 U/L (ref 0–35)
AST: 46 U/L — ABNORMAL HIGH (ref 0–37)
Albumin: 4.3 g/dL (ref 3.5–5.2)
Alkaline Phosphatase: 62 U/L (ref 39–117)
BUN: 10 mg/dL (ref 6–23)
CO2: 30 meq/L (ref 19–32)
Calcium: 10.1 mg/dL (ref 8.4–10.5)
Chloride: 98 meq/L (ref 96–112)
Creatinine, Ser: 0.81 mg/dL (ref 0.40–1.20)
GFR: 73.61 mL/min (ref >60.00)
Glucose, Bld: 103 mg/dL — ABNORMAL HIGH (ref 70–99)
Potassium: 4.1 meq/L (ref 3.5–5.1)
Sodium: 135 meq/L (ref 135–145)
Total Bilirubin: 0.7 mg/dL (ref 0.2–1.2)
Total Protein: 7.2 g/dL (ref 6.0–8.3)

## 2024-09-02 LAB — LIPID PANEL
Cholesterol: 257 mg/dL — ABNORMAL HIGH (ref 0–200)
HDL: 133.1 mg/dL (ref >39.00)
LDL Cholesterol: 112 mg/dL — ABNORMAL HIGH (ref 0–99)
NonHDL: 123.4
Total CHOL/HDL Ratio: 2
Triglycerides: 58 mg/dL (ref 0.0–149.0)
VLDL: 11.6 mg/dL (ref 0.0–40.0)

## 2024-09-02 LAB — CBC
HCT: 44.4 % (ref 36.0–46.0)
Hemoglobin: 14.9 g/dL (ref 12.0–15.0)
MCHC: 33.5 g/dL (ref 30.0–36.0)
MCV: 96.2 fl (ref 78.0–100.0)
Platelets: 205 K/uL (ref 150.0–400.0)
RBC: 4.61 Mil/uL (ref 3.87–5.11)
RDW: 13.7 % (ref 11.5–15.5)
WBC: 4 K/uL (ref 4.0–10.5)

## 2024-09-02 LAB — FOLATE: Folate: >23.7 ng/mL (ref >5.9)

## 2024-09-02 LAB — TSH: TSH: 1.81 u[IU]/mL (ref 0.35–5.50)

## 2024-09-02 LAB — HEMOGLOBIN A1C: Hgb A1c MFr Bld: 4.9 % (ref 4.6–6.5)

## 2024-09-02 LAB — VITAMIN D 25 HYDROXY (VIT D DEFICIENCY, FRACTURES): VITD: 59.86 ng/mL (ref 30.00–100.00)

## 2024-09-02 MED ORDER — FLUOXETINE HCL 40 MG PO CAPS: 40.0000 mg | ORAL_CAPSULE | Freq: Every day | ORAL | 4 refills | Status: AC

## 2024-09-02 NOTE — Assessment & Plan Note (Signed)
Continue management per oncology. 

## 2024-09-02 NOTE — Progress Notes (Signed)
 Chief Complaint:  Misty Bullock is a 68 y.o. female who presents today for her annual comprehensive physical exam.    Assessment/Plan:  Chronic Problems Addressed Today: Neuropathy Has nerve conduction study pending though likely has some component of peripheral neuropathy based on her exam and history.  She did have mildly low folate a few months ago and she has been taking folate supplementation for this.  Will recheck labs today though we will defer further management to neurology.  Anxiety Overall symptoms are stable on Prozac  40 mg daily and alprazolam  0.5 mg daily as needed.  Does not need refill today.  Insomnia Stable on Ambien  5 mg nightly as needed.  Hyperlipidemia Check lipids.  Osteoporosis Due for bone density scan next year.  Follows with gynecology.  Check vitamin D  today.  Malignant neoplasm of areola of right breast in female, estrogen receptor positive (HCC) Continue management per oncology.  Preventative Healthcare: Check labs.  Up-to-date on vaccines.  Due for colonoscopy in 2 years.  Patient Counseling(The following topics were reviewed and/or handout was given):  -Nutrition: Stressed importance of moderation in sodium/caffeine intake, saturated fat and cholesterol, caloric balance, sufficient intake of fresh fruits, vegetables, and fiber.  -Stressed the importance of regular exercise.   -Substance Abuse: Discussed cessation/primary prevention of tobacco, alcohol, or other drug use; driving or other dangerous activities under the influence; availability of treatment for abuse.   -Injury prevention: Discussed safety belts, safety helmets, smoke detector, smoking near bedding or upholstery.   -Sexuality: Discussed sexually transmitted diseases, partner selection, use of condoms, avoidance of unintended pregnancy and contraceptive alternatives.   -Dental health: Discussed importance of regular tooth brushing, flossing, and dental visits.  -Health maintenance and  immunizations reviewed. Please refer to Health maintenance section.  Return to care in 1 year for next preventative visit.     Subjective:  HPI:  He has no acute complaints today. Patient is here today for her annual physical.  See assessment / plan for status of chronic conditions.  Discussed the use of AI scribe software for clinical note transcription with the patient, who gave verbal consent to proceed.  History of Present Illness Misty Bullock is a 68 year old female who presents for an annual physical exam.  He has ongoing issues with cold feet, described as 'cold all the time' with discoloration. These symptoms have been persistent but slightly improved since the last visit. A nerve conduction study is scheduled for December 18th. Previous blood work showed low folic acid  levels, and he has been taking folic acid  supplements without significant improvement. No worsening of foot symptoms with activity.  He has a history of breast cancer, having undergone two lumpectomies and reconstructive surgery. He is not planning further surgical interventions but is considering a tattoo for cosmetic purposes. He was dissatisfied with a previous consultation regarding further reconstructive options.  He is currently taking Prozac , Ambien , and alprazolam . He has been using alprazolam  more frequently due to stress. He has enough Ambien  to last until after Christmas.  His mother, aged 72, resides in a care facility due to cognitive decline, and he visits her daily, finding the visits emotionally challenging. Both parents had dementia; his father was diagnosed in his sixties, and his mother's condition is attributed to age-related decline.  He is not engaging in regular exercise but stays active and is not sedentary. He acknowledges not exercising as much as he should.     09/02/2024   11:04 AM  Depression screen  PHQ 2/9  Decreased Interest 0  Down, Depressed, Hopeless 0  PHQ - 2 Score 0     Health Maintenance Due  Topic Date Due   Medicare Annual Wellness (AWV)  08/27/2024    ROS: Per HPI, otherwise a complete review of systems was negative.   PMH:  The following were reviewed and entered/updated in epic: Past Medical History:  Diagnosis Date   Anxiety    Cancer (HCC) 02/2024   right breast IDC with DCIS   History of chicken pox    Insomnia    Restless leg syndrome    UTI (urinary tract infection)    Patient Active Problem List   Diagnosis Date Noted   Neuropathy 09/02/2024   Vitamin B12 deficiency 03/29/2024   Avitaminosis D 03/29/2024   Malignant neoplasm of areola of right breast in female, estrogen receptor positive (HCC) 01/22/2024   DCIS (ductal carcinoma in situ) 12/22/2023   Hyperlipidemia 12/04/2019   Osteoporosis 12/04/2019   RLS (restless legs syndrome) 03/29/2018   Anxiety 03/29/2018   Insomnia 03/29/2018   Past Surgical History:  Procedure Laterality Date   bilateral blerphoplasty      BREAST BIOPSY Right 12/20/2023   MM RT BREAST BX W LOC DEV 1ST LESION IMAGE BX SPEC STEREO GUIDE 12/20/2023 [facility]   BREAST BIOPSY  01/11/2024   MM RT RADIOACTIVE SEED LOC MAMMO GUIDE 01/11/2024 [facility]   BREAST LUMPECTOMY Right 02/15/2024   Procedure: BREAST LUMPECTOMY;  Surgeon: Curvin Deward MOULD, MD;  Location: [facility];  Service: General;  Laterality: Right;  RIGHT CENTRAL LUMPECTOMY   BREAST LUMPECTOMY WITH RADIOACTIVE SEED LOCALIZATION Right 01/12/2024   Procedure: BREAST LUMPECTOMY WITH RADIOACTIVE SEED LOCALIZATION;  Surgeon: Curvin Deward MOULD, MD;  Location: [facility];  Service: General;  Laterality: Right;   cataract surgery      MASTOPEXY Left 04/17/2024   Procedure: MASTOPEXY;  Surgeon: Lowery Estefana RAMAN, DO;  Location: [facility];  Service: Plastics;  Laterality: Left;   OPEN REDUCTION INTERNAL FIXATION (ORIF) DISTAL RADIAL FRACTURE Left 03/03/2017   Procedure: OPEN REDUCTION INTERNAL  FIXATION (ORIF) LEFT DISTAL RADIUS FRACTURE;  Surgeon: Vernetta Lonni GRADE, MD;  Location: [facility];  Service: Orthopedics;  Laterality: Left;   REVISION, RECONSTRUCTION, BREAST Right 04/17/2024   Procedure: REVISION, RECONSTRUCTION, BREAST;  Surgeon: Lowery Estefana RAMAN, DO;  [facility];  Service: Plastics;  Laterality: Right;  excision excess breast tissue right breast   WISDOM TOOTH EXTRACTION      History reviewed. No pertinent family history.  Medications- reviewed and updated Current Outpatient Medications  Medication Sig Dispense Refill   ALPRAZolam  (XANAX ) 0.5 MG tablet TAKE 1 TABLET BY MOUTH AT BEDTIME AS NEEDED FOR ANXIETY 30 tablet 5   anastrozole  (ARIMIDEX ) 1 MG tablet Take 1 tablet (1 mg total) by mouth daily. 90 tablet 3   anastrozole  (ARIMIDEX ) 1 MG tablet Take 1 mg by mouth daily.     cyanocobalamin  (VITAMIN B12) 1000 MCG tablet Take 1,000 mcg by mouth daily.     denosumab (PROLIA) 60 MG/ML SOSY injection Prolia 60 mg/mL subcutaneous syringe  Dispense 1 prefilled syringe to MD office for SQ injection Q 6 months     folic acid  (FOLVITE ) 1 MG tablet Take 1 tablet (1 mg total) by mouth daily. 90 tablet 3   Multiple Vitamin (MULTIVITAMIN WITH MINERALS) TABS tablet Take 1 tablet by mouth daily.     ondansetron  (ZOFRAN ) 4 MG tablet Take 1 tablet (4  mg total) by mouth every 8 (eight) hours as needed for nausea or vomiting. 20 tablet 0   valACYclovir (VALTREX) 500 MG tablet valacyclovir 500 mg tablet     zolpidem  (AMBIEN ) 10 MG tablet TAKE 1/2 TABLET BY MOUTH EVERY DAY AT BEDTIME 45 tablet 1   FLUoxetine  (PROZAC ) 40 MG capsule Take 1 capsule (40 mg total) by mouth daily. 90 capsule 4   No current facility-administered medications for this visit.    Allergies-reviewed and updated Allergies  Allergen Reactions   Latex Swelling and Other (See Comments)    Swelling of lips during dental procedure   Augmentin  [Amoxicillin -Pot Clavulanate] Nausea And  Vomiting    Social History   Socioeconomic History   Marital status: Single    Spouse name: Not on file   Number of children: Not on file   Years of education: Not on file   Highest education level: Not on file  Occupational History   Not on file  Tobacco Use   Smoking status: Never   Smokeless tobacco: Never  Vaping Use   Vaping status: Never Used  Substance and Sexual Activity   Alcohol use: Yes    Comment: wine every evening   Drug use: No   Sexual activity: Not Currently    Birth control/protection: Post-menopausal  Other Topics Concern   Not on file  Social History Narrative   Are you right handed or left handed? Right Handed    Are you currently employed ? no   What is your current occupation? Retired    Do you live at home alone? Yes   Who lives with you?    What type of home do you live in: 1 story or 2 story? Lives in a one story home.        Social Drivers of Corporate Investment Banker Strain: Low Risk  (08/28/2023)   Overall Financial Resource Strain (CARDIA)    Difficulty of Paying Living Expenses: Not hard at all  Food Insecurity: No Food Insecurity (01/30/2024)   Hunger Vital Sign    Worried About Running Out of Food in the Last Year: Never true    Ran Out of Food in the Last Year: Never true  Transportation Needs: No Transportation Needs (01/30/2024)   PRAPARE - Administrator, Civil Service (Medical): No    Lack of Transportation (Non-Medical): No  Physical Activity: Inactive (08/28/2023)   Exercise Vital Sign    Days of Exercise per Week: 0 days    Minutes of Exercise per Session: 0 min  Stress: No Stress Concern Present (08/28/2023)   [employer] of Occupational Health - Occupational Stress Questionnaire    Feeling of Stress : Only a little  Social Connections: Socially Isolated (08/28/2023)   Social Connection and Isolation Panel    Frequency of Communication with Friends and Family: More than three times a week    Frequency  of Social Gatherings with Friends and Family: More than three times a week    Attends Religious Services: Never    Database Administrator or Organizations: No    Attends Engineer, Structural: Never    Marital Status: Never married        Objective:  Physical Exam: BP 102/62   Pulse 74   Temp (!) 97.4 F (36.3 C) (Temporal)   Ht 5' 6 (1.676 m)   Wt 118 lb 6.4 oz (53.7 kg)   LMP  (LMP Unknown)   SpO2 98%  BMI 19.11 kg/m   Body mass index is 19.11 kg/m. Wt Readings from Last 3 Encounters:  09/02/24 118 lb 6.4 oz (53.7 kg)  07/16/24 118 lb (53.5 kg)  06/17/24 117 lb 14.4 oz (53.5 kg)   Gen: NAD, resting comfortably HEENT: TMs normal bilaterally. OP clear. No thyromegaly noted.  CV: RRR with no murmurs appreciated Pulm: NWOB, CTAB with no crackles, wheezes, or rhonchi GI: Normal bowel sounds present. Soft, Nontender, Nondistended. MSK: no edema, cyanosis, or clubbing noted Skin: warm, dry Neuro: CN2-12 grossly intact. Strength 5/5 in upper and lower extremities. Reflexes symmetric and intact bilaterally.  Psych: Normal affect and thought content     Kienna Moncada M. Kennyth, MD 09/02/2024 11:39 AM

## 2024-09-02 NOTE — Patient Instructions (Addendum)
 It was very nice to see you today!  VISIT SUMMARY: You came in for your annual physical exam. We discussed your ongoing issues with cold feet, your history of breast cancer, and your current medications. We also reviewed your general health maintenance, including upcoming screenings and vaccinations.  YOUR PLAN: PERIPHERAL NEUROPATHY: You have chronic peripheral neuropathy causing a cold sensation in your feet. Your symptoms have slightly improved. -Continue taking folic acid  supplements. -A nerve conduction study is scheduled for December 18th. -We will recheck your folic acid  levels.  BREAST CANCER SURVEILLANCE: You are under surveillance following lumpectomies and reconstructive surgery for breast cancer. -Continue with surveillance mammograms and follow-up with oncology. -Consider a second opinion if you are dissatisfied with previous consultations. -You may consider tattooing for cosmetic purposes.  OSTEOPOROSIS: You have age-related osteoporosis. -A bone density scan is scheduled for 2026.  MENTAL HEALTH: You are managing chronic insomnia, anxiety, and depression with medications. -Continue taking Prozac , alprazolam , and zolpidem  as prescribed. -Your Prozac  prescription has been refilled. -Try to manage stress to reduce the need for alprazolam .  GENERAL HEALTH MAINTENANCE: We reviewed your general health maintenance, including vaccinations and screenings. -Routine blood work was ordered. -Your vaccinations are up to date. -A mammogram is scheduled for February. -Follow up with oncology in May.  Return in about 1 year (around 09/02/2025) for Annual Physical.   Take care, Dr Kennyth  PLEASE NOTE:  If you had any lab tests, please let us  know if you have not heard back within a few days. You may see your results on mychart before we have a chance to review them but we will give you a call once they are reviewed by us .   If we ordered any referrals today, please let us  know if  you have not heard from their office within the next week.   If you had any urgent prescriptions sent in today, please check with the pharmacy within an hour of our visit to make sure the prescription was transmitted appropriately.   Please try these tips to maintain a healthy lifestyle:  Eat at least 3 REAL meals and 1-2 snacks per day.  Aim for no more than 5 hours between eating.  If you eat breakfast, please do so within one hour of getting up.   Each meal should contain half fruits/vegetables, one quarter protein, and one quarter carbs (no bigger than a computer mouse)  Cut down on sweet beverages. This includes juice, soda, and sweet tea.   Drink at least 1 glass of water with each meal and aim for at least 8 glasses per day  Exercise at least 150 minutes every week.     Preventive Care 64 Years and Older, Female Preventive care refers to lifestyle choices and visits with your health care provider that can promote health and wellness. Preventive care visits are also called wellness exams. What can I expect for my preventive care visit? Counseling Your health care provider may ask you questions about your: Medical history, including: Past medical problems. Family medical history. Pregnancy and menstrual history. History of falls. Current health, including: Memory and ability to understand (cognition). Emotional well-being. Home life and relationship well-being. Sexual activity and sexual health. Lifestyle, including: Alcohol, nicotine or tobacco, and drug use. Access to firearms. Diet, exercise, and sleep habits. Work and work astronomer. Sunscreen use. Safety issues such as seatbelt and bike helmet use. Physical exam Your health care provider will check your: Height and weight. These may be used to calculate  your BMI (body mass index). BMI is a measurement that tells if you are at a healthy weight. Waist circumference. This measures the distance around your waistline.  This measurement also tells if you are at a healthy weight and may help predict your risk of certain diseases, such as type 2 diabetes and high blood pressure. Heart rate and blood pressure. Body temperature. Skin for abnormal spots. What immunizations do I need?  Vaccines are usually given at various ages, according to a schedule. Your health care provider will recommend vaccines for you based on your age, medical history, and lifestyle or other factors, such as travel or where you work. What tests do I need? Screening Your health care provider may recommend screening tests for certain conditions. This may include: Lipid and cholesterol levels. Hepatitis C test. Hepatitis B test. HIV (human immunodeficiency virus) test. STI (sexually transmitted infection) testing, if you are at risk. Lung cancer screening. Colorectal cancer screening. Diabetes screening. This is done by checking your blood sugar (glucose) after you have not eaten for a while (fasting). Mammogram. Talk with your health care provider about how often you should have regular mammograms. BRCA-related cancer screening. This may be done if you have a family history of breast, ovarian, tubal, or peritoneal cancers. Bone density scan. This is done to screen for osteoporosis. Talk with your health care provider about your test results, treatment options, and if necessary, the need for more tests. Follow these instructions at home: Eating and drinking  Eat a diet that includes fresh fruits and vegetables, whole grains, lean protein, and low-fat dairy products. Limit your intake of foods with high amounts of sugar, saturated fats, and salt. Take vitamin and mineral supplements as recommended by your health care provider. Do not drink alcohol if your health care provider tells you not to drink. If you drink alcohol: Limit how much you have to 0-1 drink a day. Know how much alcohol is in your drink. In the [place] one drink equals  one 12 oz bottle of beer (355 mL), one 5 oz glass of wine (148 mL), or one 1 oz glass of hard liquor (44 mL). Lifestyle Brush your teeth every morning and night with fluoride toothpaste. Floss one time each day. Exercise for at least 30 minutes 5 or more days each week. Do not use any products that contain nicotine or tobacco. These products include cigarettes, chewing tobacco, and vaping devices, such as e-cigarettes. If you need help quitting, ask your health care provider. Do not use drugs. If you are sexually active, practice safe sex. Use a condom or other form of protection in order to prevent STIs. Take aspirin  only as told by your health care provider. Make sure that you understand how much to take and what form to take. Work with your health care provider to find out whether it is safe and beneficial for you to take aspirin  daily. Ask your health care provider if you need to take a cholesterol-lowering medicine (statin). Find healthy ways to manage stress, such as: Meditation, yoga, or listening to music. Journaling. Talking to a trusted person. Spending time with friends and family. Minimize exposure to UV radiation to reduce your risk of skin cancer. Safety Always wear your seat belt while driving or riding in a vehicle. Do not drive: If you have been drinking alcohol. Do not ride with someone who has been drinking. When you are tired or distracted. While texting. If you have been using any mind-altering substances or drugs.  Wear a helmet and other protective equipment during sports activities. If you have firearms in your house, make sure you follow all gun safety procedures. What's next? Visit your health care provider once a year for an annual wellness visit. Ask your health care provider how often you should have your eyes and teeth checked. Stay up to date on all vaccines. This information is not intended to replace advice given to you by your health care provider. Make  sure you discuss any questions you have with your health care provider. Document Revised: 03/24/2021 Document Reviewed: 03/24/2021 Elsevier Patient Education  2024 [employer].

## 2024-09-02 NOTE — Assessment & Plan Note (Signed)
 Overall symptoms are stable on Prozac  40 mg daily and alprazolam  0.5 mg daily as needed.  Does not need refill today.

## 2024-09-02 NOTE — Assessment & Plan Note (Signed)
 Check lipids

## 2024-09-02 NOTE — Assessment & Plan Note (Signed)
Stable on Ambien 5 mg nightly as needed. 

## 2024-09-02 NOTE — Assessment & Plan Note (Signed)
 Due for bone density scan next year.  Follows with gynecology.  Check vitamin D  today.

## 2024-09-02 NOTE — Assessment & Plan Note (Signed)
 Has nerve conduction study pending though likely has some component of peripheral neuropathy based on her exam and history.  She did have mildly low folate a few months ago and she has been taking folate supplementation for this.  Will recheck labs today though we will defer further management to neurology.

## 2024-09-03 ENCOUNTER — Ambulatory Visit: Payer: Self-pay | Admitting: Family Medicine

## 2024-09-03 DIAGNOSIS — R748 Abnormal levels of other serum enzymes: Secondary | ICD-10-CM

## 2024-09-03 NOTE — Progress Notes (Signed)
 Her cholesterol is much better than the last time that we checked.  She should keep up the great work with this and we can recheck again in a year or so.  One of her liver numbers was very mildly elevated.  We should probably recheck this again in a few weeks.  Please place future order for CMET.  All of her other labs are normal and we can recheck again in a year or so.

## 2024-09-12 ENCOUNTER — Ambulatory Visit

## 2024-09-19 ENCOUNTER — Telehealth: Payer: Self-pay | Admitting: Family Medicine

## 2024-09-19 NOTE — Telephone Encounter (Signed)
 Called patient to schedule AWVS with Ellouise, NHA.SABRA patient would like someone to call her with a lab appt to follow up on her liver enzymes.. per patient Dr Kennyth had requested she do a f/u lab for this  Can someone give her a call back with this appt?  Thank you, Darice FORBES [place] [facility] AWV TEAM Direct Dial [phone]

## 2024-09-25 ENCOUNTER — Other Ambulatory Visit: Payer: Self-pay | Admitting: *Deleted

## 2024-09-25 ENCOUNTER — Ambulatory Visit

## 2024-09-25 DIAGNOSIS — R748 Abnormal levels of other serum enzymes: Secondary | ICD-10-CM

## 2024-09-25 NOTE — Telephone Encounter (Signed)
 Went to call pt to schedule lab appt as recommended following recent labs completed in November but no future lab orders placed. Please advise.

## 2024-09-25 NOTE — Telephone Encounter (Signed)
 Lab order please schedule lab appt

## 2024-09-26 ENCOUNTER — Telehealth: Payer: Self-pay | Admitting: *Deleted

## 2024-09-26 ENCOUNTER — Encounter: Admitting: Neurology

## 2024-09-26 ENCOUNTER — Ambulatory Visit: Admitting: Family

## 2024-09-26 ENCOUNTER — Encounter: Payer: Self-pay | Admitting: Family

## 2024-09-26 VITALS — BP 108/68 | HR 74 | Temp 97.9°F | Ht 66.0 in | Wt 118.0 lb

## 2024-09-26 DIAGNOSIS — R059 Cough, unspecified: Secondary | ICD-10-CM

## 2024-09-26 LAB — POCT INFLUENZA A/B
Influenza A, POC: NEGATIVE
Influenza B, POC: NEGATIVE

## 2024-09-26 LAB — POC COVID19 BINAXNOW: SARS Coronavirus 2 Ag: NEGATIVE

## 2024-09-26 NOTE — Progress Notes (Signed)
 Patient ID: Misty Bullock, female    DOB: 1956-07-19, 68 y.o.   MRN: [id number]  Chief Complaint  Patient presents with   Cough    Pt c/o cough, right ear pain and chills. Present for 3 days, Has tried ibuprofen,mucinex DM and cough syrup.   Discussed the use of AI scribe software for clinical note transcription with the patient, who gave verbal consent to proceed.  History of Present Illness Misty Bullock is a 68 year old who presents with symptoms of a viral upper respiratory infection.  She describes chills, prior ear pain, and a mostly dry cough with occasional small amounts of clear mucus starting 3 days ago,  Symptoms are improving and the ear is no longer tender. She has been using ibuprofen and Mucinex for relief.  Assessment & Plan Acute upper respiratory infection Symptoms include chills, ear pain, dry cough, and clear mucus. Negative for rapid influenza and COVID-19. Symptoms improving. - Continue Mucinex as needed. - Use cough lozenges. - Increase fluid intake, at least 2L daily. - Take Aleve or ibuprofen with food. - Use saline spray several times daily. - Call office if symptoms are not improving or worsen.  General Health Maintenance Discussed importance of flu vaccine due to current flu season and potential severe strains. He has not received the flu shot this year. - Recommended flu vaccine post-recovery.  Subjective:    Outpatient Medications Prior to Visit  Medication Sig Dispense Refill   ALPRAZolam  (XANAX ) 0.5 MG tablet TAKE 1 TABLET BY MOUTH AT BEDTIME AS NEEDED FOR ANXIETY 30 tablet 5   anastrozole  (ARIMIDEX ) 1 MG tablet Take 1 tablet (1 mg total) by mouth daily. 90 tablet 3   anastrozole  (ARIMIDEX ) 1 MG tablet Take 1 mg by mouth daily.     cyanocobalamin  (VITAMIN B12) 1000 MCG tablet Take 1,000 mcg by mouth daily.     denosumab (PROLIA) 60 MG/ML SOSY injection Prolia 60 mg/mL subcutaneous syringe  Dispense 1 prefilled syringe to MD office for SQ injection Q 6  months     FLUoxetine  (PROZAC ) 40 MG capsule Take 1 capsule (40 mg total) by mouth daily. 90 capsule 4   folic acid  (FOLVITE ) 1 MG tablet Take 1 tablet (1 mg total) by mouth daily. 90 tablet 3   Multiple Vitamin (MULTIVITAMIN WITH MINERALS) TABS tablet Take 1 tablet by mouth daily.     ondansetron  (ZOFRAN ) 4 MG tablet Take 1 tablet (4 mg total) by mouth every 8 (eight) hours as needed for nausea or vomiting. 20 tablet 0   valACYclovir (VALTREX) 500 MG tablet valacyclovir 500 mg tablet     zolpidem  (AMBIEN ) 10 MG tablet TAKE 1/2 TABLET BY MOUTH EVERY DAY AT BEDTIME 45 tablet 1   No facility-administered medications prior to visit.   Past Medical History:  Diagnosis Date   Anxiety    Cancer (HCC) 02/2024   right breast IDC with DCIS   History of chicken pox    Insomnia    Restless leg syndrome    UTI (urinary tract infection)    Past Surgical History:  Procedure Laterality Date   bilateral blerphoplasty      BREAST BIOPSY Right 12/20/2023   MM RT BREAST BX W LOC DEV 1ST LESION IMAGE BX SPEC STEREO GUIDE 12/20/2023 [facility]   BREAST BIOPSY  01/11/2024   MM RT RADIOACTIVE SEED LOC MAMMO GUIDE 01/11/2024 [facility]   BREAST LUMPECTOMY Right 02/15/2024   Procedure: BREAST LUMPECTOMY;  Surgeon: Curvin Mt III,  MD;  Location: [facility];  Service: General;  Laterality: Right;  RIGHT CENTRAL LUMPECTOMY   BREAST LUMPECTOMY WITH RADIOACTIVE SEED LOCALIZATION Right 01/12/2024   Procedure: BREAST LUMPECTOMY WITH RADIOACTIVE SEED LOCALIZATION;  Surgeon: Curvin Deward MOULD, MD;  Location: [facility];  Service: General;  Laterality: Right;   cataract surgery      MASTOPEXY Left 04/17/2024   Procedure: MASTOPEXY;  Surgeon: Lowery Estefana RAMAN, DO;  Location: [facility];  Service: Plastics;  Laterality: Left;   OPEN REDUCTION INTERNAL FIXATION (ORIF) DISTAL RADIAL FRACTURE Left 03/03/2017   Procedure: OPEN REDUCTION INTERNAL FIXATION (ORIF) LEFT DISTAL  RADIUS FRACTURE;  Surgeon: Vernetta Lonni GRADE, MD;  Location: [facility];  Service: Orthopedics;  Laterality: Left;   REVISION, RECONSTRUCTION, BREAST Right 04/17/2024   Procedure: REVISION, RECONSTRUCTION, BREAST;  Surgeon: Lowery Estefana RAMAN, DO;  Location: [facility];  Service: Plastics;  Laterality: Right;  excision excess breast tissue right breast   WISDOM TOOTH EXTRACTION     Allergies[1]    Objective:    Physical Exam Vitals and nursing note reviewed.  Constitutional:      Appearance: Normal appearance. He is ill-appearing.     Interventions: Face mask in place.  HENT:     Right Ear: Tympanic membrane and ear canal normal.     Left Ear: Tympanic membrane and ear canal normal.     Nose:     Right Sinus: No frontal sinus tenderness.     Left Sinus: No frontal sinus tenderness.     Mouth/Throat:     Mouth: Mucous membranes are moist.     Pharynx: Posterior oropharyngeal erythema present. No pharyngeal swelling, oropharyngeal exudate or uvula swelling.     Tonsils: No tonsillar exudate or tonsillar abscesses.  Cardiovascular:     Rate and Rhythm: Normal rate and regular rhythm.  Pulmonary:     Effort: Pulmonary effort is normal.     Breath sounds: Normal breath sounds.  Musculoskeletal:        General: Normal range of motion.  Lymphadenopathy:     Head:     Right side of head: No preauricular or posterior auricular adenopathy.     Left side of head: No preauricular or posterior auricular adenopathy.     Cervical: No cervical adenopathy.  Skin:    General: Skin is warm and dry.  Neurological:     Mental Status: He is alert.  Psychiatric:        Mood and Affect: Mood normal.        Behavior: Behavior normal.    BP 108/68 (BP Location: Left Arm, Patient Position: Sitting, Cuff Size: Normal)   Pulse 74   Temp 97.9 F (36.6 C) (Temporal)   Ht 5' 6 (1.676 m)   Wt 118 lb (53.5 kg)   LMP  (LMP Unknown)   SpO2 98%   BMI 19.05 kg/m  Wt Readings from  Last 3 Encounters:  09/26/24 118 lb (53.5 kg)  09/02/24 118 lb 6.4 oz (53.7 kg)  07/16/24 118 lb (53.5 kg)      Hadlei Stitt, NP     [1]  Allergies Allergen Reactions   Latex Swelling and Other (See Comments)    Swelling of lips during dental procedure   Augmentin  [Amoxicillin -Pot Clavulanate] Nausea And Vomiting

## 2024-09-26 NOTE — Telephone Encounter (Signed)
 Copied from CRM #[id number]. Topic: Clinical - Medical Advice >> Sep 26, 2024  3:14 PM Charolett L wrote: Reason for CRM: Patient requesting a call back CB# [phone]  Spoke with patient, patient want to know if she is contagious, she teste negative for flu and Covid Advise to use precaution, wash hand and used mask if out in public. Verbalized understanding

## 2024-09-27 ENCOUNTER — Other Ambulatory Visit

## 2024-09-27 DIAGNOSIS — R748 Abnormal levels of other serum enzymes: Secondary | ICD-10-CM

## 2024-09-27 LAB — COMPREHENSIVE METABOLIC PANEL WITH GFR
ALT: 43 U/L — ABNORMAL HIGH (ref 3–35)
AST: 84 U/L — ABNORMAL HIGH (ref 5–37)
Albumin: 4.3 g/dL (ref 3.5–5.2)
Alkaline Phosphatase: 63 U/L (ref 39–117)
BUN: 12 mg/dL (ref 6–23)
CO2: 27 meq/L (ref 19–32)
Calcium: 9.8 mg/dL (ref 8.4–10.5)
Chloride: 99 meq/L (ref 96–112)
Creatinine, Ser: 0.82 mg/dL (ref 0.40–1.20)
GFR: 72.5 mL/min
Glucose, Bld: 102 mg/dL — ABNORMAL HIGH (ref 70–99)
Potassium: 4.2 meq/L (ref 3.5–5.1)
Sodium: 135 meq/L (ref 135–145)
Total Bilirubin: 0.4 mg/dL (ref 0.2–1.2)
Total Protein: 7.4 g/dL (ref 6.0–8.3)

## 2024-09-30 ENCOUNTER — Ambulatory Visit: Payer: Self-pay | Admitting: Family Medicine

## 2024-09-30 NOTE — Progress Notes (Signed)
 Her liver numbers are still a little elevated. This is probably nothing significant, but recommend we check a right upper quadrant ultrasound to make sure there is nothing serious causing her elevated numbers.

## 2024-10-01 NOTE — Telephone Encounter (Signed)
 FYI

## 2024-10-22 ENCOUNTER — Telehealth: Payer: Self-pay | Admitting: Neurology

## 2024-10-22 NOTE — Telephone Encounter (Signed)
 Pt called in this afternoon. Pt stated that she has been charge for tests, and lab work  that was not done. Please call . Thanks

## 2024-10-23 NOTE — Telephone Encounter (Signed)
 Called patient and informed her that on 07/16/2024 she had 4 labs done by Dr. Tobie. Patient stated that she had totally forgot and glad I called to clarify. Patient had no further questions or concerns.

## 2024-11-28 ENCOUNTER — Encounter

## 2025-01-14 ENCOUNTER — Ambulatory Visit: Admitting: Neurology

## 2025-02-10 ENCOUNTER — Inpatient Hospital Stay: Admitting: Hematology and Oncology

## 2025-08-26 ENCOUNTER — Ambulatory Visit: Admitting: Plastic Surgery

## 2025-09-08 ENCOUNTER — Encounter: Admitting: Family Medicine
